# Patient Record
Sex: Male | Born: 1960
Health system: Southern US, Community
[De-identification: ages and names within clinical notes are randomized; demographics above are authoritative.]

## PROBLEM LIST (undated history)

## (undated) DIAGNOSIS — L309 Dermatitis, unspecified: Secondary | ICD-10-CM

## (undated) DIAGNOSIS — L409 Psoriasis, unspecified: Secondary | ICD-10-CM

## (undated) DIAGNOSIS — G4731 Primary central sleep apnea: Secondary | ICD-10-CM

## (undated) DIAGNOSIS — R112 Nausea with vomiting, unspecified: Secondary | ICD-10-CM

## (undated) DIAGNOSIS — T4145XA Adverse effect of unspecified anesthetic, initial encounter: Secondary | ICD-10-CM

## (undated) DIAGNOSIS — C189 Malignant neoplasm of colon, unspecified: Secondary | ICD-10-CM

## (undated) DIAGNOSIS — M25521 Pain in right elbow: Secondary | ICD-10-CM

## (undated) DIAGNOSIS — T8859XA Other complications of anesthesia, initial encounter: Secondary | ICD-10-CM

## (undated) DIAGNOSIS — T7840XA Allergy, unspecified, initial encounter: Secondary | ICD-10-CM

## (undated) DIAGNOSIS — K219 Gastro-esophageal reflux disease without esophagitis: Secondary | ICD-10-CM

## (undated) DIAGNOSIS — K648 Other hemorrhoids: Secondary | ICD-10-CM

## (undated) DIAGNOSIS — G4739 Other sleep apnea: Secondary | ICD-10-CM

## (undated) DIAGNOSIS — Z9889 Other specified postprocedural states: Secondary | ICD-10-CM

## (undated) DIAGNOSIS — E7800 Pure hypercholesterolemia, unspecified: Secondary | ICD-10-CM

## (undated) HISTORY — DX: Other hemorrhoids: K64.8

## (undated) HISTORY — PX: WRIST FRACTURE SURGERY: SHX121

## (undated) HISTORY — DX: Allergy, unspecified, initial encounter: T78.40XA

## (undated) HISTORY — DX: Pure hypercholesterolemia, unspecified: E78.00

## (undated) HISTORY — DX: Dermatitis, unspecified: L30.9

## (undated) HISTORY — DX: Pain in right elbow: M25.521

## (undated) HISTORY — DX: Primary central sleep apnea: G47.31

---
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
## 1999-08-19 ENCOUNTER — Other Ambulatory Visit: Admission: RE | Admit: 1999-08-19 | Discharge: 1999-08-19 | Payer: Self-pay | Admitting: Urology

## 1999-08-19 ENCOUNTER — Encounter (INDEPENDENT_AMBULATORY_CARE_PROVIDER_SITE_OTHER): Payer: Self-pay | Admitting: Specialist

## 2007-01-03 HISTORY — PX: OTHER SURGICAL HISTORY: SHX169

## 2007-09-06 ENCOUNTER — Encounter: Payer: Self-pay | Admitting: Gastroenterology

## 2007-09-20 ENCOUNTER — Encounter: Payer: Self-pay | Admitting: Gastroenterology

## 2007-09-23 ENCOUNTER — Telehealth: Payer: Self-pay | Admitting: Gastroenterology

## 2007-09-28 ENCOUNTER — Encounter: Payer: Self-pay | Admitting: Gastroenterology

## 2007-10-01 ENCOUNTER — Ambulatory Visit: Payer: Self-pay | Admitting: Gastroenterology

## 2007-10-01 DIAGNOSIS — K648 Other hemorrhoids: Secondary | ICD-10-CM

## 2007-10-01 DIAGNOSIS — Z85038 Personal history of other malignant neoplasm of large intestine: Secondary | ICD-10-CM | POA: Insufficient documentation

## 2007-10-01 DIAGNOSIS — D126 Benign neoplasm of colon, unspecified: Secondary | ICD-10-CM | POA: Insufficient documentation

## 2007-10-01 HISTORY — DX: Other hemorrhoids: K64.8

## 2007-10-04 ENCOUNTER — Telehealth: Payer: Self-pay | Admitting: Gastroenterology

## 2007-10-08 ENCOUNTER — Encounter: Payer: Self-pay | Admitting: Gastroenterology

## 2007-10-08 ENCOUNTER — Telehealth: Payer: Self-pay | Admitting: Gastroenterology

## 2007-10-09 ENCOUNTER — Telehealth: Payer: Self-pay | Admitting: Gastroenterology

## 2007-10-11 ENCOUNTER — Encounter: Payer: Self-pay | Admitting: Gastroenterology

## 2007-10-11 ENCOUNTER — Telehealth: Payer: Self-pay | Admitting: Gastroenterology

## 2007-10-15 ENCOUNTER — Ambulatory Visit: Payer: Self-pay | Admitting: Gastroenterology

## 2007-10-22 ENCOUNTER — Ambulatory Visit: Payer: Self-pay | Admitting: Gastroenterology

## 2007-10-22 ENCOUNTER — Encounter: Payer: Self-pay | Admitting: Gastroenterology

## 2007-10-22 ENCOUNTER — Telehealth: Payer: Self-pay | Admitting: Gastroenterology

## 2007-10-24 ENCOUNTER — Encounter: Payer: Self-pay | Admitting: Gastroenterology

## 2007-10-28 ENCOUNTER — Telehealth (INDEPENDENT_AMBULATORY_CARE_PROVIDER_SITE_OTHER): Payer: Self-pay | Admitting: *Deleted

## 2008-09-02 ENCOUNTER — Encounter: Payer: Self-pay | Admitting: Gastroenterology

## 2008-09-17 ENCOUNTER — Encounter (INDEPENDENT_AMBULATORY_CARE_PROVIDER_SITE_OTHER): Payer: Self-pay | Admitting: *Deleted

## 2009-06-04 ENCOUNTER — Telehealth: Payer: Self-pay | Admitting: Gastroenterology

## 2010-02-03 NOTE — Procedures (Signed)
Summary: Recall / [facility]  Recall / [place] [facility]   Imported By: Lennie Odor 06/03/2009 16:57:41  _____________________________________________________________________  External Attachment:    Type:   Image     Comment:   External Document

## 2010-02-03 NOTE — Progress Notes (Signed)
Summary: Schedule Colonoscopy  Phone Note Outgoing Call   Call placed by: Lamona Curl CMA Duncan Dull),  June 04, 2009 1:12 PM Call placed to: Patient Summary of Call: Patient is overdue for his colonoscopy. His colonoscopy completed in September 2009 showed two transverse colon polyps (path showed it to be three fragments of tubular adenoma. No high grade dysplasia or malignancy.) He also had rectal biopsies to come back showing some slight edema. He has a personal history of cancer as well. I have left a message for the patient to call back.  Initial call taken by: Lamona Curl CMA Duncan Dull),  June 04, 2009 1:15 PM  Follow-up for Phone Call        I have left a message for the patient to call back.Dottie Nelson-[name] CMA (AAMA)  June 10, 2009 1:30 PM      Appended Document: Schedule Colonoscopy no return call recieved. We will send a letter.

## 2011-02-24 ENCOUNTER — Telehealth: Payer: Self-pay

## 2011-02-24 DIAGNOSIS — G4720 Circadian rhythm sleep disorder, unspecified type: Secondary | ICD-10-CM

## 2011-02-24 NOTE — Telephone Encounter (Signed)
.  [facility] PT WOULD LIKE TO KNOW IF DR DOOLITTLE WOULD REFER HIM TO A PLACE FOR SLEEP APENA. PLEASE CALL [phone]

## 2011-02-25 NOTE — Telephone Encounter (Signed)
LMOM for patient to CB. 

## 2011-02-25 NOTE — Telephone Encounter (Signed)
Patient would like [place] to set up sleep study to rule out apnea.  Would like this appt on a Thursday or Friday night.  Chart to MD.

## 2011-02-25 NOTE — Telephone Encounter (Signed)
Does he want [place] to set up a sleep study to rule out apnea or an evaluation by a sleep neurologist to decide if he needs the sleep study

## 2011-05-31 ENCOUNTER — Telehealth: Payer: Self-pay

## 2011-05-31 NOTE — Telephone Encounter (Signed)
[id number] x1 

## 2011-06-01 NOTE — Telephone Encounter (Signed)
Called, spoke with pt.  He states this has already been taken care of.  Nothing further needed at this time.

## 2011-07-10 ENCOUNTER — Other Ambulatory Visit: Payer: Self-pay | Admitting: Internal Medicine

## 2011-07-18 ENCOUNTER — Encounter: Payer: Self-pay | Admitting: Gastroenterology

## 2011-08-27 ENCOUNTER — Telehealth: Payer: Self-pay

## 2011-08-27 NOTE — Telephone Encounter (Signed)
Please get details.  The patient hasn't been here since we went live on [facility] 01/31/2011. He probably needs and OV to discuss this.

## 2011-08-27 NOTE — Telephone Encounter (Signed)
Pt would like to get a referral for a pulmonologist.

## 2011-08-27 NOTE — Telephone Encounter (Signed)
Spoke with pt. His Mom needs a primary care and would like Dr. Merla Riches to refer them to someone he thinks he is good and would also help with her lung problems. The message was incorrect, she already has a pulmonologist

## 2011-08-28 NOTE — Telephone Encounter (Signed)
Drs Rodrigo Ran, Larina Earthly, John Russo(or any of their partners in fact) at [facility] [phone] can schedule their own appt

## 2011-08-28 NOTE — Telephone Encounter (Signed)
I have advised patient. This is not for his father, is for his mother, Alejandro [name] she was seen here about 3 weeks ago by Dr Merla Riches

## 2011-08-28 NOTE — Telephone Encounter (Signed)
LMOM to CB. 

## 2011-09-28 ENCOUNTER — Encounter: Payer: Self-pay | Admitting: *Deleted

## 2011-09-28 DIAGNOSIS — G4730 Sleep apnea, unspecified: Secondary | ICD-10-CM

## 2011-10-27 ENCOUNTER — Telehealth: Payer: Self-pay

## 2011-10-27 NOTE — Telephone Encounter (Signed)
The patient called to request refills on Simvastatin and Fluticasone cream .05%.  The patient stated the pharmacy advised him to contact [facility] as the prescriptions are now expired.  Please call patient at [phone].

## 2011-10-27 NOTE — Telephone Encounter (Signed)
Please pull chart.

## 2011-10-27 NOTE — Telephone Encounter (Signed)
Chart pulled to PA pool at nurses station [phone]

## 2011-10-28 NOTE — Telephone Encounter (Signed)
LMOM on cell to call back. Called home number and LM with a male to [facility]

## 2011-10-28 NOTE — Telephone Encounter (Signed)
What is patient's follow up plan? Has not had an office visit with [place] since July 2012. Was advised at that time to follow up in 6 months.

## 2011-10-29 NOTE — Telephone Encounter (Signed)
Pt.notified

## 2011-11-06 ENCOUNTER — Ambulatory Visit (INDEPENDENT_AMBULATORY_CARE_PROVIDER_SITE_OTHER): Payer: BC Managed Care – PPO | Admitting: Internal Medicine

## 2011-11-06 ENCOUNTER — Encounter: Payer: Self-pay | Admitting: Internal Medicine

## 2011-11-06 VITALS — BP 104/70 | HR 71 | Temp 98.3°F | Resp 16 | Ht 66.0 in | Wt 165.0 lb

## 2011-11-06 DIAGNOSIS — E785 Hyperlipidemia, unspecified: Secondary | ICD-10-CM

## 2011-11-06 DIAGNOSIS — G4730 Sleep apnea, unspecified: Secondary | ICD-10-CM

## 2011-11-06 DIAGNOSIS — R1115 Cyclical vomiting syndrome unrelated to migraine: Secondary | ICD-10-CM

## 2011-11-06 DIAGNOSIS — K219 Gastro-esophageal reflux disease without esophagitis: Secondary | ICD-10-CM

## 2011-11-06 DIAGNOSIS — R7989 Other specified abnormal findings of blood chemistry: Secondary | ICD-10-CM

## 2011-11-06 DIAGNOSIS — IMO0001 Reserved for inherently not codable concepts without codable children: Secondary | ICD-10-CM

## 2011-11-06 DIAGNOSIS — M549 Dorsalgia, unspecified: Secondary | ICD-10-CM

## 2011-11-06 DIAGNOSIS — R112 Nausea with vomiting, unspecified: Secondary | ICD-10-CM

## 2011-11-06 LAB — POCT CBC
Hemoglobin: 14.4 g/dL (ref 14.1–18.1)
Lymph, poc: 1.7 (ref 0.6–3.4)
MCH, POC: 27.9 pg (ref 27–31.2)
MCHC: 31.2 g/dL — AB (ref 31.8–35.4)
MID (cbc): 0.5 (ref 0–0.9)
MPV: 9.1 fL (ref 0–99.8)
POC Granulocyte: 4.2 (ref 2–6.9)
POC MID %: 8.4 %M (ref 0–12)
Platelet Count, POC: 296 10*3/uL (ref 142–424)
WBC: 6.5 10*3/uL (ref 4.6–10.2)

## 2011-11-06 MED ORDER — CALCIPOTRIENE 0.005 % EX SOLN: 1.0000 | Freq: Two times a day (BID) | CUTANEOUS | Status: DC

## 2011-11-06 NOTE — Progress Notes (Signed)
Subjective:    Patient ID: Alejandro [name], male    DOB: 20-May-1960, 51 y.o.   MRN: [id number]  HPI 1 mo hx nausea(long hx motion sickness--freq vomiting w/ least provocation) Builds up to vomiting which makes him better///has cont intermittently Lots of myalgias off and on in big muscles of legs like just worked out/neck tight No fever No wt loss No bm change discontin simvast 3 mos ago(chiro rec because of back pain) Mild gerd/burping For the past several weeks  Past medical history Patient Active Problem List  Diagnosis  . POLYP, COLON  . HEMORRHOIDS-INTERNAL  . PERSONAL HX COLON CANCER-Followed at [facility] and currently cancer free  . Sleep apnea-Recently diagnosed with central sleep apnea with marked hypoxia and is only partially responding to BiPAP    He also like to recheck his lipids now that he has been off Zocor for 2-3 months Review of Systems No weight loss or night sweats/general malaise thought secondary to sleep disorder    No headaches or vision changes No chest pain or palpitation/the shortness of breath or dyspnea on exertion Appetite has not changed appreciably There is no diarrhea or constipation and no specific abdominal pain Negative genitourinary symptoms The skin rashes or joint complaints except for the extensive myalgias Objective:   Physical Exam Vital signs normal HEENT clear No thyromegaly or lymphadenopathy Heart regular Lungs clear Abdomen soft nontender nondistended without masses or organomegaly Extremities with no edema/good peripheral pulses/He is generally tender to palpation in the anterior posterior thighs, the gastrocs, and  large muscles of the lumbar area.       Assessment & Plan:   1. Sleep apnea  Testosterone  2. GERD (gastroesophageal reflux disease)  Sedimentation Rate  3. Other and unspecified hyperlipidemia  T4, Free, TSH, Lipid Panel  4. Back pain  PSA, Sedimentation Rate  5. Myalgia and myositis  POCT CBC, Testosterone,  Comprehensive metabolic panel, PSA, CK, Sedimentation Rate  6. Other and unspecified hyperlipidemia Active T4, Free, TSH, Lipid Panel  7. Intractable nausea and vomiting  Amylase, Comprehensive metabolic panel, Sedimentation Rate   Results for orders placed in visit on 11/06/11  POCT CBC      Component Value Range   WBC 6.5  4.6 - 10.2 K/uL   Lymph, poc 1.7  0.6 - 3.4   POC LYMPH PERCENT 26.7  10 - 50 %L   MID (cbc) 0.5  0 - 0.9   POC MID % 8.4  0 - 12 %M   POC Granulocyte 4.2  2 - 6.9   Granulocyte percent 64.9  37 - 80 %G   RBC 5.16  4.69 - 6.13 M/uL   Hemoglobin 14.4  14.1 - 18.1 g/dL   HCT, POC 40.9  81.1 - 53.7 %   MCV 89.3  80 - 97 fL   MCH, POC 27.9  27 - 31.2 pg   MCHC 31.2 (*) 31.8 - 35.4 g/dL   RDW, POC 91.4     Platelet Count, POC 296  142 - 424 K/uL   MPV 9.1  0 - 99.8 fL  T4, FREE      Component Value Range   Free T4 1.10  0.80 - 1.80 ng/dL  TSH      Component Value Range   TSH 2.363  0.350 - 4.500 uIU/mL  TESTOSTERONE      Component Value Range   Testosterone      LIPID PANEL      Component Value Range   Cholesterol 230 (*)  0 - 200 mg/dL   Triglycerides 161 (*) <150 mg/dL   HDL 44  >09 mg/dL   Total CHOL/HDL Ratio 5.2     VLDL 55 (*) 0 - 40 mg/dL   LDL Cholesterol 604 (*) 0 - 99 mg/dL  AMYLASE      Component Value Range   Amylase 60  0 - 105 U/L  COMPREHENSIVE METABOLIC PANEL      Component Value Range   Sodium 139  135 - 145 mEq/L   Potassium 3.8  3.5 - 5.3 mEq/L   Chloride 104  96 - 112 mEq/L   CO2 27  19 - 32 mEq/L   Glucose, Bld 100 (*) 70 - 99 mg/dL   BUN 16  6 - 23 mg/dL   Creat 5.40  9.81 - 1.91 mg/dL   Total Bilirubin 0.3  0.3 - 1.2 mg/dL   Alkaline Phosphatase 119 (*) 39 - 117 U/L   AST 18  0 - 37 U/L   ALT 18  0 - 53 U/L   Total Protein 6.7  6.0 - 8.3 g/dL   Albumin 4.2  3.5 - 5.2 g/dL   Calcium 8.9  8.4 - 47.8 mg/dL  PSA      Component Value Range   PSA 0.66  <=4.00 ng/mL  CK      Component Value Range   Total CK 131  7 - 232  U/L  SEDIMENTATION RATE      Component Value Range   Sed Rate 1  0 - 16 mm/hr   Discuss next step after labs

## 2011-11-07 LAB — COMPREHENSIVE METABOLIC PANEL
ALT: 18 U/L (ref 0–53)
AST: 18 U/L (ref 0–37)
Albumin: 4.2 g/dL (ref 3.5–5.2)
Alkaline Phosphatase: 119 U/L — ABNORMAL HIGH (ref 39–117)
Glucose, Bld: 100 mg/dL — ABNORMAL HIGH (ref 70–99)
Potassium: 3.8 mEq/L (ref 3.5–5.3)
Sodium: 139 mEq/L (ref 135–145)
Total Bilirubin: 0.3 mg/dL (ref 0.3–1.2)
Total Protein: 6.7 g/dL (ref 6.0–8.3)

## 2011-11-07 LAB — SEDIMENTATION RATE: Sed Rate: 1 mm/hr (ref 0–16)

## 2011-11-07 LAB — CK: Total CK: 131 U/L (ref 7–232)

## 2011-11-07 LAB — AMYLASE: Amylase: 60 U/L (ref 0–105)

## 2011-11-07 LAB — LIPID PANEL
LDL Cholesterol: 131 mg/dL — ABNORMAL HIGH (ref 0–99)
Total CHOL/HDL Ratio: 5.2 Ratio
VLDL: 55 mg/dL — ABNORMAL HIGH (ref 0–40)

## 2011-11-07 LAB — T4, FREE: Free T4: 1.1 ng/dL (ref 0.80–1.80)

## 2011-11-10 NOTE — Addendum Note (Signed)
Addended by: Johnnette Litter on: 11/10/2011 04:45 PM   Modules accepted: Orders

## 2012-04-04 ENCOUNTER — Encounter: Payer: Self-pay | Admitting: Internal Medicine

## 2012-05-29 ENCOUNTER — Encounter: Payer: Self-pay | Admitting: Neurology

## 2012-05-29 ENCOUNTER — Ambulatory Visit (INDEPENDENT_AMBULATORY_CARE_PROVIDER_SITE_OTHER): Payer: BC Managed Care – PPO | Admitting: Neurology

## 2012-05-29 VITALS — BP 117/81 | HR 62 | Temp 97.7°F | Ht 62.0 in | Wt 162.0 lb

## 2012-05-29 DIAGNOSIS — G4731 Primary central sleep apnea: Secondary | ICD-10-CM

## 2012-05-29 DIAGNOSIS — G4730 Sleep apnea, unspecified: Secondary | ICD-10-CM | POA: Insufficient documentation

## 2012-05-29 MED ORDER — ZOLPIDEM TARTRATE 10 MG PO TABS: 10.0000 mg | ORAL_TABLET | Freq: Every evening | ORAL | Status: DC | PRN

## 2012-05-29 NOTE — Patient Instructions (Signed)
Exercise to Lose Weight Exercise and a healthy diet may help you lose weight. Your doctor may suggest specific exercises. EXERCISE IDEAS AND TIPS  Choose low-cost things you enjoy doing, such as walking, bicycling, or exercising to workout videos.  Take stairs instead of the elevator.  Walk during your lunch break.  Park your car further away from work or school.  Go to a gym or an exercise class.  Start with 5 to 10 minutes of exercise each day. Build up to 30 minutes of exercise 4 to 6 days a week.  Wear shoes with good support and comfortable clothes.  Stretch before and after working out.  Work out until you breathe harder and your heart beats faster.  Drink extra water when you exercise.  Do not do so much that you hurt yourself, feel dizzy, or get very short of breath. Exercises that burn about 150 calories:  Running 1  miles in 15 minutes.  Playing volleyball for 45 to 60 minutes.  Washing and waxing a car for 45 to 60 minutes.  Playing touch football for 45 minutes.  Walking 1  miles in 35 minutes.  Pushing a stroller 1  miles in 30 minutes.  Playing basketball for 30 minutes.  Raking leaves for 30 minutes.  Bicycling 5 miles in 30 minutes.  Walking 2 miles in 30 minutes.  Dancing for 30 minutes.  Shoveling snow for 15 minutes.  Swimming laps for 20 minutes.  Walking up stairs for 15 minutes.  Bicycling 4 miles in 15 minutes.  Gardening for 30 to 45 minutes.  Jumping rope for 15 minutes.  Washing windows or floors for 45 to 60 minutes. Document Released: 01/21/2010 Document Revised: 03/13/2011 Document Reviewed: 01/21/2010 ExitCare Patient Information 2014 [employer]. Sleep Apnea Sleep apnea is disorder that affects a person's sleep. A person with sleep apnea has abnormal pauses in their breathing when they sleep. It is hard for them to get a good sleep. This makes a person tired during the day. It also can lead to other physical  problems. There are three types of sleep apnea. One type is when breathing stops for a short time because your airway is blocked (obstructive sleep apnea). Another type is when the brain sometimes fails to give the normal signal to breathe to the muscles that control your breathing (central sleep apnea). The third type is a combination of the other two types. HOME CARE  Do not sleep on your back. Try to sleep on your side.  Take all medicine as told by your doctor.  Avoid alcohol, calming medicines (sedatives), and depressant drugs.  Try to lose weight if you are overweight. Talk to your doctor about a healthy weight goal. Your doctor may have you use a device that helps to open your airway. It can help you get the air that you need. It is called a positive airway pressure (PAP) device. There are three types of PAP devices:  Continuous positive airway pressure (CPAP) device.  Nasal expiratory positive airway pressure (EPAP) device.  Bilevel positive airway pressure (BPAP) device. MAKE SURE YOU:  Understand these instructions.  Will watch your condition.  Will get help right away if you are not doing well or get worse. Document Released: 09/28/2007 Document Revised: 12/06/2011 Document Reviewed: 04/22/2011 Exit[employer] Patient Information 2014 [employer].  

## 2012-05-29 NOTE — Assessment & Plan Note (Signed)
Patient is a 52 year old Caucasian male that presented with the complaint of excessive daytime sleepiness, frequent awakenings nonrestorative sleep shortness of breath insomnia and witnessed snoring. AHI was 44.2 CPAP was not tolerated and costs central apneas. Patient has been placed on a V. cut SV which finally reduced the apnea index was 0.7

## 2012-05-29 NOTE — Progress Notes (Signed)
[facility]  Provider:  Dr Vickey Huger Referring Provider: No ref. provider found Primary Care Physician:  Dr. Merla Riches. [facility] [facility]   Chief Complaint  Patient presents with  . Follow-up    rm 10,CPAP    HPI:  Alejandro [name] is a 52 y.o. male here as a referral from Dr.Dollittle. The patient was diagnosed with complex apnea in 2013, a baseline AHI of 44 in his split study on 05-11-11 , followed by a BIPAP adapt SV titration .   Mr. Alejandro [name] is a right-handed Caucasian gentleman 52 years of age who presented and 2013 with a past medical history of comfort and with sleep apnea. He had sleep complains of excessive daytime sleepiness, frequent awakenings with shortness of breath, witnessed apneas and excessive snoring as well as insomnia. He felt that his sleep was not restorative a sleep study on 04/21/2011- the AHI of 44.2 was severe ,  CPAP was applied in the split study protocol but caused  central apneas to emerge . The patient returned for a full BiPAP-adapt SV titration and an EPAP of 5 cm water the AHI was reduced to 0.0.   A compliance download from October 2013 showed a reduction of the apnea index was 0.7 hypopnea index to 2.3 AHI 3.0 , he used the machine at the time 4 hours 27 minutes nightly and 30 over 30 days.  The patient reported that he never quite felt that the positive airway pressure made any difference to him in daytime, he felt not less sleepy or not better restored. He discontinued the PAP and today endorses the Epworth score at 6 points,  Off PAP for 2 month . [facility] D[facility]  He  is motivated to restart his PAP therapy , but discouraged that he seems to lose the mask every night, waking him up. His wife sleeps in ano there room. He took the PAP to skiing in [place] and got many error messages, he will have it checked in the sleep lab today,  A download here failed.   His  bedtimes are between 11 PM  and midnight  And he rises about 7  AM. He normally sleeps through the night , he has no nocturia. His wife sleeps in a seperate room.      Patient doesn't have many risk factors that would have explained this high apnea index at baseline. His BMI is a normal range he doesn't have nasal deviation but has a history of sinus disease and allergic rhinitis causing him to mouth breath.  He does not have retrognathia no history of oral surgeries. There is a family history of possible apnea in his father and one grandfather.  Review of Systems: Out of a complete 14 system review, the patient complains of only the following symptoms, and all other reviewed systems are negative.    History   Social History  . Marital Status: Married    Spouse Name: N/A    Number of Children: N/A  . Years of Education: N/A   Occupational History  . Not on file.   Social History Main Topics  . Smoking status: Never Smoker   . Smokeless tobacco: Not on file  . Alcohol Use: Yes  . Drug Use: No  . Sexually Active: Not on file   Other Topics Concern  . Not on file   Social History Narrative  . No narrative on file    Family History  Problem Relation Age of Onset  .  Sleep disorder Father   . Sleep disorder Paternal Grandfather     Past Medical History  Diagnosis Date  . Sleep apnea syndrome     complex apnea , baseline AHI of 44 on 04-21-11, changed to adapt SV      History reviewed. No pertinent past surgical history.  Current Outpatient Prescriptions  Medication Sig Dispense Refill  . Calcipotriene 0.005 % solution Apply 1 applicator topically 2 (two) times daily.  60 mL  2  . simvastatin (ZOCOR) 40 MG tablet Take 40 mg by mouth every evening.       No current facility-administered medications for this visit.    Allergies as of 05/29/2012 - Review Complete 05/29/2012  Allergen Reaction Noted  . Depo-medrol (methylprednisolone acetate) Other (See Comments) 06/03/2007  . Codeine  10/15/2007    Vitals: There were no  vitals taken for this visit. Last Weight:  Wt Readings from Last 1 Encounters:  11/06/11 165 lb (74.844 kg)   Last Height:   Ht Readings from Last 1 Encounters:  11/06/11 5\' 6"  (1.676 m)   Vision Screening:   Physical exam:  General: The patient is awake, alert and appears not in acute distress. The patient is well groomed. Head: Normocephalic, atraumatic. Neck is supple. Mallampati 3 , very  Small upper airway, no nasal restriction, no retrognathia.  neck circumference: 15.6  Cardiovascular:  Regular rate and rhythm, without  murmurs or carotid bruit, and without distended neck veins. Respiratory: Lungs are clear to auscultation. Skin:  Without evidence of edema, or rash Trunk: BMI is normal posture.  Neurologic exam : The patient is awake and alert, oriented to place and time.  Memory subjective  described as intact. There is a normal attention span & concentration ability. Speech is fluent without  dysarthria, dysphonia or aphasia. Mood and affect are appropriate.  Cranial nerves: Pupils are equal and briskly reactive to light. Extraocular movements  in vertical and horizontal planes intact and without nystagmus. Visual fields by finger perimetry are intact. Hearing to finger rub intact.  Facial sensation intact to fine touch. Facial motor strength is symmetric and tongue and uvula move midline.  Motor exam:   Normal tone and normal muscle bulk and symmetric normal strength in all extremities.  Sensory:  Fine touch, pinprick and vibration were tested in all extremities. Proprioception is tested in the upper extremities only. This was  normal.  Coordination: Rapid alternating movements in the fingers/hands is tested and normal. Finger-to-nose maneuver tested and normal without evidence of ataxia, dysmetria or tremor.  Gait and station: Patient walks without assistive device and is able and assisted stool climb up to the exam table. Strength within normal limits. Stance is stable and  normal. Deep tendon reflexes: in the  upper and lower extremities are symmetric and intact. .   Assessment:  After physical and neurologic examination, review of laboratory studies, imaging, neurophysiology testing and pre-existing records, assessment will be reviewed on the problem list.  Plan:  Treatment plan and additional workup will be reviewed under Problem List.    Based on the patient's report of a problem as almost nightly loss of the mask, or dislodgment, recommend to try the inflatable ultrastructure of a Pillairo mask The Epworth score was of no concern. Previous data download documented good effectiveness of the machine. The patient would like to use this adapt SV with a better fitting mask. Rv with download in 3 month

## 2012-07-10 ENCOUNTER — Telehealth: Payer: Self-pay

## 2012-07-10 MED ORDER — SCOPOLAMINE 1 MG/3DAYS TD PT72: 1.0000 | MEDICATED_PATCH | TRANSDERMAL | Status: DC

## 2012-07-10 NOTE — Telephone Encounter (Signed)
Pt states that he will be going on a cruise in week and would like to know if something can be called in for him for motion sickness. [phone]

## 2012-07-10 NOTE — Telephone Encounter (Signed)
Scopolamine patches sent to pharmacy.

## 2012-07-10 NOTE — Telephone Encounter (Signed)
Patient advised.

## 2012-07-11 ENCOUNTER — Telehealth: Payer: Self-pay

## 2012-07-11 MED ORDER — CALCIPOTRIENE 0.005 % EX SOLN: 1.0000 | Freq: Two times a day (BID) | CUTANEOUS | Status: DC

## 2012-07-11 NOTE — Telephone Encounter (Signed)
Pt would like to have a refill on Calcipotriene 0.005 % solution. [facility] [address]  [phone]

## 2012-07-11 NOTE — Telephone Encounter (Signed)
Rx sent to pharmacy   

## 2012-07-18 ENCOUNTER — Telehealth: Payer: Self-pay

## 2012-07-18 MED ORDER — FLUTICASONE PROPIONATE 0.05 % EX CREA: TOPICAL_CREAM | Freq: Two times a day (BID) | CUTANEOUS | Status: DC

## 2012-07-18 NOTE — Telephone Encounter (Signed)
Pt called and reported that we had RFd the wrong Rx. He needs RF of his fluticasone Propionate cream 0.05% instead of the scalp sol that the pharmacy had requested. Advised pt that he is due for OV but that I can give him one RF. Pt agreed to set up appt.

## 2012-07-29 ENCOUNTER — Ambulatory Visit (INDEPENDENT_AMBULATORY_CARE_PROVIDER_SITE_OTHER): Payer: BC Managed Care – PPO | Admitting: Emergency Medicine

## 2012-07-29 VITALS — BP 118/78 | HR 65 | Temp 98.1°F | Resp 16 | Ht 66.0 in | Wt 162.0 lb

## 2012-07-29 DIAGNOSIS — A088 Other specified intestinal infections: Secondary | ICD-10-CM

## 2012-07-29 LAB — POCT CBC
HCT, POC: 49 % (ref 43.5–53.7)
MCH, POC: 29 pg (ref 27–31.2)
MCV: 89 fL (ref 80–97)
MID (cbc): 0.4 (ref 0–0.9)
POC LYMPH PERCENT: 14.4 %L (ref 10–50)
Platelet Count, POC: 252 10*3/uL (ref 142–424)
RDW, POC: 14 %
WBC: 7.6 10*3/uL (ref 4.6–10.2)

## 2012-07-29 LAB — COMPREHENSIVE METABOLIC PANEL
AST: 15 U/L (ref 0–37)
Albumin: 4.7 g/dL (ref 3.5–5.2)
Alkaline Phosphatase: 119 U/L — ABNORMAL HIGH (ref 39–117)
BUN: 15 mg/dL (ref 6–23)
Calcium: 9.6 mg/dL (ref 8.4–10.5)
Chloride: 101 mEq/L (ref 96–112)
Creat: 0.91 mg/dL (ref 0.50–1.35)
Glucose, Bld: 100 mg/dL — ABNORMAL HIGH (ref 70–99)
Potassium: 4.1 mEq/L (ref 3.5–5.3)

## 2012-07-29 MED ORDER — ONDANSETRON 8 MG PO TBDP: 8.0000 mg | ORAL_TABLET | Freq: Three times a day (TID) | ORAL | Status: DC | PRN

## 2012-07-29 NOTE — Patient Instructions (Signed)
Clear Liquid Diet  The clear liquid dietconsists of foods that are liquid or will become liquid at room temperature.You should be able to see through the liquid and beverages. Examples of foods allowed on a clear liquid diet include fruit juice, broth or bouillon, gelatin, or frozen ice pops.  The purpose of this diet is to provide necessary fluid, electrolytes such as sodium and potassium, and energy to keep the body functioning during times when you are not able to consume a regular diet.A clear liquid diet should not be continued for long periods of time as it is not nutritionally adequate.   REASONS FOR USING A CLEAR LIQUID DIET   In sudden onset (acute) conditions for a patient before or after surgery.   As the first step in oral feeding.   For fluid and electrolyte replacement in diarrheal diseases.   As a diet before certain medical tests are performed.  ADEQUACY  The clear liquid diet is adequate only in ascorbic acid, according to the Recommended Dietary Allowances of the National Research Council.  CHOOSING FOODS  Breads and Starches   Allowed:  None are allowed.   Avoid: All are avoided.  Vegetables   Allowed:  Strained tomato or vegetable juice.   Avoid: Any others.  Fruit   Allowed:  Strained fruit juices and fruit drinks. Include 1 serving of citrus or vitamin C-enriched fruit juice daily.   Avoid: Any others.  Meat and Meat Substitutes   Allowed:  None are allowed.   Avoid: All are avoided.  Milk   Allowed:  None are allowed.   Avoid: All are avoided.  Soups and Combination Foods   Allowed:  Clear bouillon, broth, or strained broth-based soups.   Avoid: Any others.  Desserts and Sweets   Allowed:  Sugar, honey. High protein gelatin. Flavored gelatin, ices, or frozen ice pops that do not contain milk.   Avoid: Any others.  Fats and Oils   Allowed:  None are allowed.   Avoid: All are avoided.  Beverages   Allowed: Cereal beverages, coffee (regular or decaffeinated), tea, or soda  at the discretion of your caregiver.   Avoid: Any others.  Condiments   Allowed:  Iodized salt.   Avoid: Any others, including pepper.  Supplements   Allowed:  Liquid nutrition beverages.   Avoid: Any others that contain lactose or fiber.  SAMPLE MEAL PLAN  Breakfast   4 oz (120 mL) strained orange juice.    to 1 cup (125 to 250 mL) gelatin (plain or fortified).   1 cup (250 mL) beverage (coffee or tea).   Sugar, if desired.  Midmorning Snack    cup (125 mL) gelatin (plain or fortified).  Lunch   1 cup (250 mL) broth or consomm.   4 oz (120 mL) strained grapefruit juice.    cup (125 mL) gelatin (plain or fortified).   1 cup (250 mL) beverage (coffee or tea).   Sugar, if desired.  Midafternoon Snack    cup (125 mL) fruit ice.    cup (125 mL) strained fruit juice.  Dinner   1 cup (250 mL) broth or consomm.    cup (125 mL) cranberry juice.    cup (125 mL) flavored gelatin (plain or fortified).   1 cup (250 mL) beverage (coffee or tea).   Sugar, if desired.  Evening Snack   4 oz (120 mL) strained apple juice (vitamin C-fortified).    cup (125 mL) flavored gelatin (plain or fortified).  Document 

## 2012-07-29 NOTE — Progress Notes (Addendum)
[facility] [address] [place] [address] [phone]  Date:  07/29/2012   Name:  Alejandro [name]   DOB:  1960/04/25   MRN:  [id number]  PCP:  No primary provider on file.    Chief Complaint: Nausea, Emesis, Chills, Fatigue and recent travel   History of Present Illness:  Alejandro [name] is a 52 y.o. very pleasant male patient who presents with the following:  Ill for past three days after returning from cruise to the [place].  Has bloating and belching and malaise and fatigue.  Nausea and vomiting.  Stools are loose but not diarrhea.  Foul smelling.  No abdominal pain.  Eating anything.  Fine when lays down but gets ill feeling when upright.  No ill contacts. No one else from cruise is sick to the best of his knowledge.  No improvement with over the counter medications or other home remedies. Denies other complaint or health concern today.   Patient Active Problem List   Diagnosis Date Noted  . Sleep apnea syndrome   . Sleep apnea 09/28/2011  . POLYP, COLON 10/01/2007  . HEMORRHOIDS-INTERNAL 10/01/2007  . PERSONAL HX COLON CANCER 10/01/2007    Past Medical History  Diagnosis Date  . Sleep apnea syndrome     complex apnea , baseline AHI of 44 on 04-21-11, changed to adapt SV    . High cholesterol   . Central sleep apnea     AHI of 44 exacerbated to 52 and failed CPAP  . Allergy   . Cancer     Past Surgical History  Procedure Laterality Date  . Colonoscopy  2009    CANCEROUS POLYPS REMOVED    History  Substance Use Topics  . Smoking status: Never Smoker   . Smokeless tobacco: Not on file  . Alcohol Use: 0.0 oz/week    0.5 drink(s) per week    Family History  Problem Relation Age of Onset  . Sleep disorder Father   . Sleep disorder Paternal Grandfather   . Cancer Paternal Grandmother     breast    Allergies  Allergen Reactions  . Cortisone   . Depo-Medrol (Methylprednisolone Acetate) Other (See Comments)    Blurry vision- blisters  behind retinas  . Codeine     REACTION: nausea, vomiting    Medication list has been reviewed and updated.  Current Outpatient Prescriptions on File Prior to Visit  Medication Sig Dispense Refill  . Calcipotriene 0.005 % solution Apply 1 applicator topically 2 (two) times daily.  60 mL  2  . fluticasone (CUTIVATE) 0.05 % cream Apply topically 2 (two) times daily. PATIENT NEEDS OFFICE VISIT FOR ADDITIONAL REFILLS  30 g  0  . scopolamine (TRANSDERM-SCOP) 1.5 MG Place 1 patch (1.5 mg total) onto the skin every 3 (three) days.  10 patch  0  . simvastatin (ZOCOR) 40 MG tablet Take 40 mg by mouth every evening.      . zolpidem (AMBIEN) 10 MG tablet Take 1 tablet (10 mg total) by mouth at bedtime as needed for sleep.  30 tablet  1   No current facility-administered medications on file prior to visit.    Review of Systems:  As per HPI, otherwise negative.    Physical Examination: Filed Vitals:   07/29/12 0826  BP: 118/78  Pulse: 65  Temp: 98.1 F (36.7 C)  Resp: 16   Filed Vitals:   07/29/12 0826  Height: 5\' 6"  (1.676 m)  Weight: 162 lb (73.483  kg)   Body mass index is 26.16 kg/(m^2). Ideal Body Weight: Weight in (lb) to have BMI = 25: 154.6  GEN: WDWN, NAD, Non-toxic, A & O x 3 HEENT: Atraumatic, Normocephalic. Neck supple. No masses, No LAD. Ears and Nose: No external deformity. CV: RRR, No M/G/R. No JVD. No thrill. No extra heart sounds. PULM: CTA B, no wheezes, crackles, rhonchi. No retractions. No resp. distress. No accessory muscle use. ABD: S, NT, ND, +BS. No rebound. No HSM. EXTR: No c/c/e NEURO Normal gait.  PSYCH: Normally interactive. Conversant. Not depressed or anxious appearing.  Calm demeanor.    Assessment and Plan: Gastroenteritis zofran Labs   Signed,  Phillips Odor, MD   Results for orders placed in visit on 07/29/12  POCT CBC      Result Value Range   WBC 7.6  4.6 - 10.2 K/uL   Lymph, poc 1.1  0.6 - 3.4   POC LYMPH PERCENT 14.4  10 - 50  %L   MID (cbc) 0.4  0 - 0.9   POC MID % 5.3  0 - 12 %M   POC Granulocyte 6.1  2 - 6.9   Granulocyte percent 80.3 (*) 37 - 80 %G   RBC 5.51  4.69 - 6.13 M/uL   Hemoglobin 16.0  14.1 - 18.1 g/dL   HCT, POC 16.1  09.6 - 53.7 %   MCV 89.0  80 - 97 fL   MCH, POC 29.0  27 - 31.2 pg   MCHC 32.7  31.8 - 35.4 g/dL   RDW, POC 04.5     Platelet Count, POC 252  142 - 424 K/uL   MPV 9.7  0 - 99.8 fL

## 2012-08-02 LAB — OVA AND PARASITE SCREEN
OP: NONE SEEN
OP: NONE SEEN

## 2012-08-04 LAB — STOOL CULTURE

## 2012-08-07 ENCOUNTER — Encounter (HOSPITAL_COMMUNITY): Payer: Self-pay | Admitting: Pharmacy Technician

## 2012-08-14 NOTE — Pre-Procedure Instructions (Signed)
Rik Anstine  08/14/2012   Your procedure is scheduled on:  08/22/12  Report to [facility] at 8 AM.  Call this number if you have problems the morning of surgery: [phone]   Remember:   Do not eat food or drink liquids after midnight.   Take these medicines the morning of surgery with A SIP OF WATER: prilosec   Do not wear jewelry, make-up or nail polish.  Do not wear lotions, powders, or perfumes. You may wear deodorant.  Do not shave 48 hours prior to surgery. Men may shave face and neck.  Do not bring valuables to the hospital.  [facility] is not responsible                   for any belongings or valuables.  Contacts, dentures or bridgework may not be worn into surgery.  Leave suitcase in the car. After surgery it may be brought to your room.  For patients admitted to the hospital, checkout time is 11:00 AM the day of  discharge.   Patients discharged the day of surgery will not be allowed to drive  home.  Name and phone number of your driver: family  Special Instructions: Shower using CHG 2 nights before surgery and the night before surgery.  If you shower the day of surgery use CHG.  Use special wash - you have one bottle of CHG for all showers.  You should use approximately 1/3 of the bottle for each shower.   Please read over the following fact sheets that you were given: Pain Booklet, Coughing and Deep Breathing and Surgical Site Infection Prevention

## 2012-08-15 ENCOUNTER — Encounter (HOSPITAL_COMMUNITY)
Admission: RE | Admit: 2012-08-15 | Discharge: 2012-08-15 | Disposition: A | Discharge status: Home / Self Care | Payer: BC Managed Care – PPO | Source: Ambulatory Visit | Attending: Orthopedic Surgery | Admitting: Orthopedic Surgery

## 2012-08-15 ENCOUNTER — Encounter (HOSPITAL_COMMUNITY): Payer: Self-pay

## 2012-08-15 DIAGNOSIS — Z01812 Encounter for preprocedural laboratory examination: Secondary | ICD-10-CM | POA: Insufficient documentation

## 2012-08-15 DIAGNOSIS — Z01818 Encounter for other preprocedural examination: Secondary | ICD-10-CM | POA: Insufficient documentation

## 2012-08-15 HISTORY — DX: Adverse effect of unspecified anesthetic, initial encounter: T41.45XA

## 2012-08-15 HISTORY — DX: Other complications of anesthesia, initial encounter: T88.59XA

## 2012-08-15 HISTORY — DX: Psoriasis, unspecified: L40.9

## 2012-08-15 HISTORY — DX: Gastro-esophageal reflux disease without esophagitis: K21.9

## 2012-08-15 LAB — CBC
HCT: 45.1 % (ref 39.0–52.0)
MCHC: 33.7 g/dL (ref 30.0–36.0)
RDW: 13.1 % (ref 11.5–15.5)

## 2012-08-21 MED ORDER — DEXTROSE 5 % IV SOLN
3.0000 g | INTRAVENOUS | Status: AC
  Administered 2012-08-22: 3 g via INTRAVENOUS
  Filled 2012-08-21: qty 3000

## 2012-08-22 ENCOUNTER — Encounter (HOSPITAL_COMMUNITY)
Admission: RE | Disposition: A | Discharge status: Home / Self Care | Payer: Self-pay | Source: Ambulatory Visit | Attending: Orthopedic Surgery

## 2012-08-22 ENCOUNTER — Encounter (HOSPITAL_COMMUNITY): Payer: Self-pay | Admitting: Certified Registered Nurse Anesthetist

## 2012-08-22 ENCOUNTER — Observation Stay (HOSPITAL_COMMUNITY)
Admission: RE | Admit: 2012-08-22 | Discharge: 2012-08-23 | Disposition: A | Discharge status: Home / Self Care | Payer: BC Managed Care – PPO | Source: Ambulatory Visit | Attending: Orthopedic Surgery | Admitting: Orthopedic Surgery

## 2012-08-22 ENCOUNTER — Encounter (HOSPITAL_COMMUNITY): Payer: Self-pay

## 2012-08-22 ENCOUNTER — Ambulatory Visit (HOSPITAL_COMMUNITY): Payer: BC Managed Care – PPO | Admitting: Certified Registered Nurse Anesthetist

## 2012-08-22 DIAGNOSIS — G4730 Sleep apnea, unspecified: Secondary | ICD-10-CM | POA: Insufficient documentation

## 2012-08-22 DIAGNOSIS — M23305 Other meniscus derangements, unspecified medial meniscus, unspecified knee: Principal | ICD-10-CM | POA: Insufficient documentation

## 2012-08-22 DIAGNOSIS — E7800 Pure hypercholesterolemia, unspecified: Secondary | ICD-10-CM | POA: Insufficient documentation

## 2012-08-22 HISTORY — DX: Other sleep apnea: G47.39

## 2012-08-22 HISTORY — DX: Other specified postprocedural states: R11.2

## 2012-08-22 HISTORY — DX: Other specified postprocedural states: Z98.890

## 2012-08-22 HISTORY — PX: KNEE ARTHROSCOPY: SHX127

## 2012-08-22 HISTORY — DX: Malignant neoplasm of colon, unspecified: C18.9

## 2012-08-22 HISTORY — DX: Primary central sleep apnea: G47.31

## 2012-08-22 SURGERY — ARTHROSCOPY, KNEE
Anesthesia: General | Site: Knee | Laterality: Right | Wound class: Clean

## 2012-08-22 MED ORDER — PROPOFOL 10 MG/ML IV BOLUS
INTRAVENOUS | Status: DC | PRN
  Administered 2012-08-22: 200 mg via INTRAVENOUS

## 2012-08-22 MED ORDER — LIDOCAINE HCL (PF) 1 % IJ SOLN
INTRAMUSCULAR | Status: DC | PRN
  Administered 2012-08-22: 30 mL

## 2012-08-22 MED ORDER — ONDANSETRON HCL 4 MG PO TABS: 4.0000 mg | ORAL_TABLET | ORAL | Status: DC | PRN

## 2012-08-22 MED ORDER — LACTATED RINGERS IV SOLN
INTRAVENOUS | Status: DC
  Administered 2012-08-22: 1000 mL via INTRAVENOUS

## 2012-08-22 MED ORDER — ONDANSETRON HCL 4 MG/2ML IJ SOLN: 4.0000 mg | INTRAMUSCULAR | Status: DC | PRN

## 2012-08-22 MED ORDER — SODIUM CHLORIDE 0.9 % IR SOLN
Status: DC | PRN
  Administered 2012-08-22: 6000 mL

## 2012-08-22 MED ORDER — OXYCODONE-ACETAMINOPHEN 5-325 MG PO TABS: 1.0000 | ORAL_TABLET | ORAL | Status: DC | PRN

## 2012-08-22 MED ORDER — PROMETHAZINE HCL 25 MG/ML IJ SOLN
12.5000 mg | Freq: Four times a day (QID) | INTRAMUSCULAR | Status: DC | PRN
  Administered 2012-08-22: 12.5 mg via INTRAVENOUS
  Filled 2012-08-22: qty 1

## 2012-08-22 MED ORDER — CHLORHEXIDINE GLUCONATE 4 % EX LIQD: 60.0000 mL | Freq: Once | CUTANEOUS | Status: DC

## 2012-08-22 MED ORDER — GLYCOPYRROLATE 0.2 MG/ML IJ SOLN
INTRAMUSCULAR | Status: DC | PRN
  Administered 2012-08-22: 0.2 mg via INTRAVENOUS

## 2012-08-22 MED ORDER — HYDROMORPHONE HCL PF 1 MG/ML IJ SOLN
INTRAMUSCULAR | Status: AC
  Administered 2012-08-22: 0.5 mg via INTRAVENOUS
  Filled 2012-08-22: qty 1

## 2012-08-22 MED ORDER — HYDROMORPHONE HCL PF 1 MG/ML IJ SOLN
0.2500 mg | INTRAMUSCULAR | Status: DC | PRN
  Administered 2012-08-22: 0.5 mg via INTRAVENOUS

## 2012-08-22 MED ORDER — PHENOL 1.4 % MT LIQD: 1.0000 | OROMUCOSAL | Status: DC | PRN

## 2012-08-22 MED ORDER — LIDOCAINE HCL (CARDIAC) 20 MG/ML IV SOLN
INTRAVENOUS | Status: DC | PRN
  Administered 2012-08-22: 100 mg via INTRAVENOUS

## 2012-08-22 MED ORDER — ONDANSETRON HCL 4 MG/2ML IJ SOLN
INTRAMUSCULAR | Status: DC | PRN
  Administered 2012-08-22 (×2): 4 mg via INTRAVENOUS

## 2012-08-22 MED ORDER — SCOPOLAMINE 1 MG/3DAYS TD PT72
1.0000 | MEDICATED_PATCH | TRANSDERMAL | Status: DC
  Administered 2012-08-22: 1 via TRANSDERMAL
  Administered 2012-08-22: 1.5 mg via TRANSDERMAL
  Filled 2012-08-22 (×2): qty 1

## 2012-08-22 MED ORDER — MORPHINE SULFATE 4 MG/ML IJ SOLN
INTRAMUSCULAR | Status: AC
  Filled 2012-08-22: qty 1

## 2012-08-22 MED ORDER — PANTOPRAZOLE SODIUM 40 MG PO TBEC: 40.0000 mg | DELAYED_RELEASE_TABLET | Freq: Every day | ORAL | Status: DC

## 2012-08-22 MED ORDER — ONDANSETRON HCL 4 MG/2ML IJ SOLN: 4.0000 mg | Freq: Once | INTRAMUSCULAR | Status: DC | PRN

## 2012-08-22 MED ORDER — LACTATED RINGERS IV SOLN
INTRAVENOUS | Status: DC
  Administered 2012-08-22: 22:00:00 via INTRAVENOUS

## 2012-08-22 MED ORDER — MORPHINE SULFATE 2 MG/ML IJ SOLN
2.0000 mg | INTRAMUSCULAR | Status: DC | PRN
  Administered 2012-08-22: 2 mg via INTRAVENOUS
  Filled 2012-08-22: qty 1

## 2012-08-22 MED ORDER — FENTANYL CITRATE 0.05 MG/ML IJ SOLN
INTRAMUSCULAR | Status: DC | PRN
  Administered 2012-08-22: 100 ug via INTRAVENOUS
  Administered 2012-08-22 (×2): 50 ug via INTRAVENOUS

## 2012-08-22 MED ORDER — MORPHINE SULFATE 4 MG/ML IJ SOLN
INTRAMUSCULAR | Status: DC | PRN
  Administered 2012-08-22: 4 mg

## 2012-08-22 MED ORDER — LACTATED RINGERS IV SOLN
INTRAVENOUS | Status: DC | PRN
  Administered 2012-08-22: 09:00:00 via INTRAVENOUS

## 2012-08-22 MED ORDER — LIDOCAINE HCL (PF) 1 % IJ SOLN
INTRAMUSCULAR | Status: AC
  Filled 2012-08-22: qty 30

## 2012-08-22 SURGICAL SUPPLY — 34 items
BANDAGE ELASTIC 6 VELCRO ST LF (GAUZE/BANDAGES/DRESSINGS) ×2 IMPLANT
BLADE CUDA 5.5 (BLADE) IMPLANT
BLADE CUTTER GATOR 3.5 (BLADE) ×2 IMPLANT
BLADE GREAT WHITE 4.2 (BLADE) ×2 IMPLANT
BOOTCOVER CLEANROOM LRG (PROTECTIVE WEAR) ×8 IMPLANT
CLOSURE STERI-STRIP 1/4X4 (GAUZE/BANDAGES/DRESSINGS) ×1 IMPLANT
CLOTH BEACON ORANGE TIMEOUT ST (SAFETY) ×2 IMPLANT
DRAPE ARTHROSCOPY W/POUCH 114 (DRAPES) ×2 IMPLANT
DRSG PAD ABDOMINAL 8X10 ST (GAUZE/BANDAGES/DRESSINGS) ×2 IMPLANT
DURAPREP 26ML APPLICATOR (WOUND CARE) ×2 IMPLANT
GLOVE BIO SURGEON STRL SZ7.5 (GLOVE) ×2 IMPLANT
GLOVE BIO SURGEON STRL SZ8 (GLOVE) ×2 IMPLANT
GLOVE EUDERMIC 7 POWDERFREE (GLOVE) ×2 IMPLANT
GLOVE SS BIOGEL STRL SZ 7.5 (GLOVE) ×1 IMPLANT
GLOVE SUPERSENSE BIOGEL SZ 7.5 (GLOVE) ×1
GOWN STRL NON-REIN LRG LVL3 (GOWN DISPOSABLE) ×2 IMPLANT
GOWN STRL REIN XL XLG (GOWN DISPOSABLE) ×4 IMPLANT
KIT BASIN OR (CUSTOM PROCEDURE TRAY) ×2 IMPLANT
KIT ROOM TURNOVER OR (KITS) ×2 IMPLANT
MANIFOLD NEPTUNE II (INSTRUMENTS) ×2 IMPLANT
NDL 18GX1X1/2 (RX/OR ONLY) (NEEDLE) ×1 IMPLANT
NEEDLE 18GX1X1/2 (RX/OR ONLY) (NEEDLE) ×2 IMPLANT
PACK ARTHROSCOPY DSU (CUSTOM PROCEDURE TRAY) ×2 IMPLANT
PAD ARMBOARD 7.5X6 YLW CONV (MISCELLANEOUS) ×4 IMPLANT
PADDING CAST COTTON 6X4 STRL (CAST SUPPLIES) ×2 IMPLANT
RING FOAM WHITE (MISCELLANEOUS) ×2 IMPLANT
SET ARTHROSCOPY TUBING (MISCELLANEOUS) ×2
SET ARTHROSCOPY TUBING LN (MISCELLANEOUS) ×1 IMPLANT
SPONGE GAUZE 4X4 12PLY (GAUZE/BANDAGES/DRESSINGS) ×2 IMPLANT
SPONGE LAP 4X18 X RAY DECT (DISPOSABLE) ×2 IMPLANT
STRIP CLOSURE SKIN 1/2X4 (GAUZE/BANDAGES/DRESSINGS) ×2 IMPLANT
SYR 30ML LL (SYRINGE) ×2 IMPLANT
TOWEL OR 17X24 6PK STRL BLUE (TOWEL DISPOSABLE) ×2 IMPLANT
WATER STERILE IRR 1000ML POUR (IV SOLUTION) ×2 IMPLANT

## 2012-08-22 NOTE — Transfer of Care (Signed)
Immediate Anesthesia Transfer of Care Note  Patient: Alejandro [name]  Procedure(s) Performed: Procedure(s): RIGHT KNEE ARTHROSCOPY WITH DEBRIDEMENT  (Right)  Patient Location: PACU  Anesthesia Type:General  Level of Consciousness: awake, alert , oriented and patient cooperative  Airway & Oxygen Therapy: Patient Spontanous Breathing and Patient connected to nasal cannula oxygen  Post-op Assessment: Report given to PACU RN, Post -op Vital signs reviewed and stable and Patient moving all extremities X 4  Post vital signs: Reviewed and stable  Complications: No apparent anesthesia complications

## 2012-08-22 NOTE — Anesthesia Postprocedure Evaluation (Signed)
  Anesthesia Post-op Note  Patient: Alejandro [name]  Procedure(s) Performed: Procedure(s): RIGHT KNEE ARTHROSCOPY WITH DEBRIDEMENT  (Right)  Patient Location: PACU  Anesthesia Type:General  Level of Consciousness: awake, alert , oriented and patient cooperative  Airway and Oxygen Therapy: Patient Spontanous Breathing  Post-op Pain: mild  Post-op Assessment: Post-op Vital signs reviewed, Patient's Cardiovascular Status Stable, Respiratory Function Stable, Patent Airway, No signs of Nausea or vomiting and Pain level controlled  Post-op Vital Signs: stable  Complications: No apparent anesthesia complications

## 2012-08-22 NOTE — Preoperative (Signed)
Beta Blockers   Reason not to administer Beta Blockers:Not Applicable 

## 2012-08-22 NOTE — Op Note (Signed)
08/22/2012  10:46 AM  PATIENT:   Alejandro [name]  52 y.o. male  PRE-OPERATIVE DIAGNOSIS:  RIGHT KNEE MEDIAL MENISCAL TEAR   POST-OPERATIVE DIAGNOSIS:  same  PROCEDURE:  RKA, partial MM  SURGEON:  Neshia Mckenzie, Vania Rea. M.D.  ASSISTANTS: Shuford pac   ANESTHESIA:   LMA + local  EBL: min  SPECIMEN:  none  Drains: none   PATIENT DISPOSITION:  PACU - hemodynamically stable.    PLAN OF CARE: Admit for overnight observation  Dictation# [id number]

## 2012-08-22 NOTE — Progress Notes (Signed)
Orthopedic Tech Progress Note Patient Details:  Alejandro [name] 1960-07-07 [id number] Crutches delivered. Crutch training not performed at this time. Patient sleep and unable to get up.   Ortho Devices Type of Ortho Device: Crutches Ortho Device/Splint Interventions: Ordered   [place] R Thompson 08/22/2012, 3:20 PM

## 2012-08-22 NOTE — Anesthesia Preprocedure Evaluation (Addendum)
Anesthesia Evaluation  Patient identified by MRN, date of birth, ID band Patient awake    Reviewed: Allergy & Precautions, H&P , NPO status , Patient's Chart, lab work & pertinent test results  History of Anesthesia Complications (+) PONV  Airway Mallampati: II TM Distance: >3 FB Neck ROM: Full    Dental  (+) Dental Advisory Given   Pulmonary sleep apnea ,  Does not tolerate CPAP         Cardiovascular     Neuro/Psych    GI/Hepatic GERD-  Medicated and Controlled,  Endo/Other    Renal/GU      Musculoskeletal   Abdominal   Peds  Hematology   Anesthesia Other Findings   Reproductive/Obstetrics                          Anesthesia Physical Anesthesia Plan  ASA: III  Anesthesia Plan: General   Post-op Pain Management:    Induction: Intravenous  Airway Management Planned: LMA and Oral ETT  Additional Equipment:   Intra-op Plan:   Post-operative Plan: Extubation in OR  Informed Consent: I have reviewed the patients History and Physical, chart, labs and discussed the procedure including the risks, benefits and alternatives for the proposed anesthesia with the patient or authorized representative who has indicated his/her understanding and acceptance.     Plan Discussed with: CRNA, Anesthesiologist and Surgeon  Anesthesia Plan Comments:         Anesthesia Quick Evaluation

## 2012-08-22 NOTE — H&P (Signed)
Alejandro [name]    Chief Complaint: RIGHT KNEE MEDIAL MENISCAL TEAR  HPI: The patient is a 52 y.o. male with chronic right medial knee pain  Past Medical History  Diagnosis Date  . Sleep apnea syndrome     complex apnea , baseline AHI of 44 on 04-21-11, changed to adapt SV    . High cholesterol   . Central sleep apnea     AHI of 44 exacerbated to 52 and failed CPAP  . Allergy   . Complication of anesthesia     Patient has hard time waking up  . Psoriasis   . Cancer     polpy removed from colon that was cancerous  . GERD (gastroesophageal reflux disease)   . PONV (postoperative nausea and vomiting)     Past Surgical History  Procedure Laterality Date  . Colonoscopy  2009    CANCEROUS POLYPS REMOVED  . Knee arthroscopy Left 1979  . Wrist surgery Left     Family History  Problem Relation Age of Onset  . Sleep disorder Father   . Sleep disorder Paternal Grandfather   . Cancer Paternal Grandmother     breast    Social History:  reports that he has never smoked. He does not have any smokeless tobacco history on file. He reports that  drinks alcohol. He reports that he does not use illicit drugs.  Allergies:  Allergies  Allergen Reactions  . Cortisone   . Depo-Medrol [Methylprednisolone Acetate] Other (See Comments)    Blurry vision- blisters behind retinas  . Codeine     REACTION: nausea, vomiting  . Other     Does/t likt narcotics and cannot take steroids     Medications Prior to Admission  Medication Sig Dispense Refill  . Calcipotriene 0.005 % solution Apply 1 applicator topically 2 (two) times daily.  60 mL  2  . fluticasone (CUTIVATE) 0.05 % cream Apply topically 2 (two) times daily. PATIENT NEEDS OFFICE VISIT FOR ADDITIONAL REFILLS  30 g  0  . omeprazole (PRILOSEC) 20 MG capsule Take 20 mg by mouth daily.         Physical Exam: right knee with med  Vitals  Temp:  [97 F (36.1 C)] 97 F (36.1 C) (08/21 0809) Pulse Rate:  [67] 67 (08/21 0809) Resp:   [16] 16 (08/21 0809) BP: (128)/(79) 128/79 mmHg (08/21 0809) SpO2:  [97 %] 97 % (08/21 0809)  Assessment/Plan  Impression: RIGHT KNEE MEDIAL MENISCAL TEAR   Plan of Action: Procedure(s): RIGHT KNEE ARTHROSCOPY WITH DEBRIDEMENT   Marrisa Kimber M 08/22/2012, 9:17 AM

## 2012-08-22 NOTE — Anesthesia Procedure Notes (Signed)
Procedure Name: LMA Insertion Date/Time: 08/22/2012 10:03 AM Performed by: Rogelia Boga Pre-anesthesia Checklist: Patient identified, Emergency Drugs available, Suction available, Patient being monitored and Timeout performed Patient Re-evaluated:Patient Re-evaluated prior to inductionOxygen Delivery Method: Circle system utilized Preoxygenation: Pre-oxygenation with 100% oxygen Intubation Type: IV induction LMA: LMA inserted LMA Size: 4.0 Tube type: Oral Number of attempts: 1 Placement Confirmation: positive ETCO2 and breath sounds checked- equal and bilateral Tube secured with: Tape Dental Injury: Teeth and Oropharynx as per pre-operative assessment

## 2012-08-23 ENCOUNTER — Encounter (HOSPITAL_COMMUNITY): Payer: Self-pay | Admitting: Orthopedic Surgery

## 2012-08-23 NOTE — Discharge Summary (Signed)
PATIENT ID:      Alejandro [name]  MRN:     [id number] DOB/AGE:    [birth year] 52 y.o.     DISCHARGE SUMMARY  ADMISSION DATE:    08/22/2012 DISCHARGE DATE:    ADMISSION DIAGNOSIS: RIGHT KNEE MEDIAL MENISCAL TEAR  Past Medical History  Diagnosis Date  . High cholesterol   . Central sleep apnea     AHI of 44 exacerbated to 52 and failed CPAP  . Allergy   . Complication of anesthesia     Patient has hard time waking up  . Psoriasis   . GERD (gastroesophageal reflux disease)   . PONV (postoperative nausea and vomiting)   . Complex sleep apnea syndrome     baseline AHI of 44 on 04-21-11, changed to adapt SV    . Colon cancer     polpy removed from colon that was cancerous    DISCHARGE DIAGNOSIS:   Active Problems:   * No active hospital problems. *   PROCEDURE: Procedure(s): RIGHT KNEE ARTHROSCOPY WITH DEBRIDEMENT  on 08/22/2012  CONSULTS:     HISTORY:  See H&P in chart.  HOSPITAL COURSE:  Khian Remo is a 52 y.o. admitted on 08/22/2012 with a chief complaint of right knee pain, and found to have a diagnosis of RIGHT KNEE MEDIAL MENISCAL TEAR .  They were brought to the operating room on 08/22/2012 and underwent Procedure(s): RIGHT KNEE ARTHROSCOPY WITH DEBRIDEMENT .    They were given perioperative antibiotics: Anti-infectives   Start     Dose/Rate Route Frequency Ordered Stop   08/22/12 0600  ceFAZolin (ANCEF) 3 g in dextrose 5 % 50 mL IVPB     3 g 160 mL/hr over 30 Minutes Intravenous On call to O.R. 08/21/12 1257 08/22/12 1010    .  Patient underwent the above named procedure and tolerated it well. The following day they were hemodynamically stable and pain was controlled . They were neurovascularly intact to the operative extremity. They stayed overnight due to sever sleep apnea and his oxygenation status was good post op day 1   DIAGNOSTIC STUDIES:  RECENT RADIOGRAPHIC STUDIES :  No results found.  RECENT VITAL SIGNS:  Patient Vitals for the past 24 hrs:  BP  Temp Temp src Pulse Resp SpO2 Height Weight  08/23/12 0645 106/73 mmHg 98.1 F (36.7 C) Oral 58 16 96 % - -  08/22/12 2240 110/70 mmHg 98.5 F (36.9 C) Oral 57 16 99 % - -  08/22/12 1430 118/81 mmHg 97.6 F (36.4 C) - 54 17 100 % - -  08/22/12 1420 - - - - - - 5\' 5"  (1.651 m) 72.576 kg (160 lb)  08/22/12 1345 113/79 mmHg 97.4 F (36.3 C) - 55 18 100 % - -  08/22/12 1330 - - - 55 11 100 % - -  08/22/12 1315 124/79 mmHg - - 58 23 99 % - -  08/22/12 1300 - - - 58 13 99 % - -  08/22/12 1245 - - - 81 15 100 % - -  08/22/12 1230 - - - 72 14 100 % - -  08/22/12 1215 122/78 mmHg 97.5 F (36.4 C) - 71 19 100 % - -  08/22/12 1200 125/84 mmHg - - 72 12 100 % - -  08/22/12 1145 132/80 mmHg - - 80 15 100 % - -  08/22/12 1130 133/89 mmHg - - 77 16 100 % - -  08/22/12 1115 122/82 mmHg 96.9 F (36.1 C) -  81 12 100 % - -  08/22/12 1100 132/96 mmHg - - 81 9 100 % - -  08/22/12 1057 135/86 mmHg 96.6 F (35.9 C) - 90 12 100 % - -  08/22/12 0809 128/79 mmHg 97 F (36.1 C) Oral 67 16 97 % - -  .  RECENT EKG RESULTS:   No orders found for this or any previous visit.  DISCHARGE INSTRUCTIONS:   Future Appointments Provider Department Dept Phone   08/30/2012 9:30 AM Nilda Riggs, NP [facility] [phone]      DISCHARGE MEDICATIONS:     Medication List         Calcipotriene 0.005 % solution  Apply 1 applicator topically 2 (two) times daily.     fluticasone 0.05 % cream  Commonly known as:  CUTIVATE  Apply topically 2 (two) times daily. PATIENT NEEDS OFFICE VISIT FOR ADDITIONAL REFILLS     omeprazole 20 MG capsule  Commonly known as:  PRILOSEC  Take 20 mg by mouth daily.     oxyCODONE-acetaminophen 5-325 MG per tablet  Commonly known as:  PERCOCET  Take 1-2 tablets by mouth every 4 (four) hours as needed for pain.        FOLLOW UP VISIT:       Follow-up Information   Follow up with Senaida Lange, MD. (call to be seen in 7-10 days)    Specialty:   Orthopedic Surgery   Contact information:   [address] [place] [address] [phone]       DISCHARGE TO: Home  DISPOSITION: good  DISCHARGE CONDITION:  Rodolph Bong for Dr. Francena Hanly 08/23/2012, 7:41 AM

## 2012-08-23 NOTE — Op Note (Signed)
NAMEEMMITTE, SURGEON NO.:  [id number]  MEDICAL RECORD NO.:  [id number]  LOCATION:  [facility]                         FACILITY:  [facility]  PHYSICIAN:  Vania Rea. Anshi Jalloh, M.D.  DATE OF BIRTH:  1960/09/09  DATE OF PROCEDURE: DATE OF DISCHARGE:                              OPERATIVE REPORT   PREOPERATIVE DIAGNOSIS:  Chronic right medial knee pain with MRI and clinical evidence from the meniscus tear.  POSTOPERATIVE DIAGNOSIS:  Right knee medial meniscus tear.  PROCEDURE: 1. Right knee diagnostic arthroscopy. 2. Partial medial meniscectomy.  SURGEON:  Vania Rea. Renia Mikelson, M.D.  ASSIST:  French Ana A. Shuford, PA-C.  ANESTHESIA:  LMA general as well as local.  TOURNIQUET TIME:  None was used.  BLOOD LOSS:  Minimal.  DRAINS:  None.  HISTORY:  Mr. Grenz is a 52 year old gentleman who has had persistent activity-related right medial knee pain with mechanical symptoms that had been refractory to prolonged attempts at conservative management. Preoperative MRI scan confirms complex medial meniscal tear.  Due to his ongoing pain, functional limitations, and mechanical symptoms, he is brought to the operating room at this time for planned right knee arthroscopy as described below.  Preoperatively, I counseled Mr. Tillison on treatment options as well as risks versus benefits thereof.  Possible surgical complications were reviewed including potential for bleeding, infection, neurovascular injury, DVT, PE, as well as persistent pain.  He understands and accepts and agrees with our planned procedure.  PROCEDURE IN DETAIL:  After undergoing routine preop evaluation, the patient received prophylactic antibiotics and brought to the operating room, placed supine on the operating table, underwent smooth induction of a LMA general anesthesia.  The right leg was placed in a leg holder and sterilely prepped and draped in standard fashion.  Time-out was called.  Standard arthroscopy  portals were established and diagnostic arthroscopy was performed.  The suprapatellar pouch and gutters were cleared of any loose bodies.  Patellofemoral joint showed normal articular cartilage and normal patellar tracking.  The intercondylar notch showed cruciate ligaments to be intact.  Medially, there was a complex tear involving the posterior half of the medial meniscus with a large pedunculated fragment of meniscal tissue emanating from the mid third of the meniscal rim.  The large meniscal fragment was amputated with a basket and removed as a single piece and then basket was used to trim the rim of the medial meniscus back to a stable margin.  I would estimate approximately 40% to 50% meniscus was excised.  The articular surfaces were in good condition.  Laterally, the articular surfaces were in good condition.  The meniscus was stable and intact.  At this point, final inspection and irrigation was then completed.  Fluid and instruments were removed.  Combination of Marcaine and morphine were instilled in the knee joint, additional Marcaine about the portals. Portal was closed with Steri-Strips.  Dry dressing.  The knee was wrapped with Ace bandage and support stocking.  The patient was awakened, extubated, and taken to recovery room in stable condition.     Vania Rea. Marlys Stegmaier, M.D.     KMS/MEDQ  D:  08/22/2012  T:  08/22/2012  Job:  [id number]

## 2012-08-30 ENCOUNTER — Ambulatory Visit: Payer: BC Managed Care – PPO | Admitting: Nurse Practitioner

## 2012-12-22 ENCOUNTER — Ambulatory Visit (INDEPENDENT_AMBULATORY_CARE_PROVIDER_SITE_OTHER): Payer: BC Managed Care – PPO | Admitting: Physician Assistant

## 2012-12-22 VITALS — BP 122/68 | HR 75 | Temp 98.4°F | Resp 18 | Ht 65.5 in | Wt 166.4 lb

## 2012-12-22 DIAGNOSIS — L259 Unspecified contact dermatitis, unspecified cause: Secondary | ICD-10-CM

## 2012-12-22 DIAGNOSIS — R059 Cough, unspecified: Secondary | ICD-10-CM

## 2012-12-22 DIAGNOSIS — R05 Cough: Secondary | ICD-10-CM

## 2012-12-22 DIAGNOSIS — R0981 Nasal congestion: Secondary | ICD-10-CM

## 2012-12-22 DIAGNOSIS — J329 Chronic sinusitis, unspecified: Secondary | ICD-10-CM

## 2012-12-22 DIAGNOSIS — J3489 Other specified disorders of nose and nasal sinuses: Secondary | ICD-10-CM

## 2012-12-22 DIAGNOSIS — L309 Dermatitis, unspecified: Secondary | ICD-10-CM

## 2012-12-22 MED ORDER — BENZONATATE 200 MG PO CAPS: 200.0000 mg | ORAL_CAPSULE | Freq: Two times a day (BID) | ORAL | Status: DC | PRN

## 2012-12-22 MED ORDER — AZITHROMYCIN 500 MG PO TABS: 500.0000 mg | ORAL_TABLET | Freq: Every day | ORAL | Status: DC

## 2012-12-22 MED ORDER — CALCIPOTRIENE 0.005 % EX SOLN: 1.0000 | Freq: Two times a day (BID) | CUTANEOUS | Status: DC

## 2012-12-22 NOTE — Progress Notes (Signed)
   Subjective:    Patient ID: Alejandro [name], male    DOB: 11-Aug-1960, 52 y.o.   MRN: [id number]  Cough Associated symptoms include postnasal drip and rhinorrhea. Pertinent negatives include no chills, ear pain, fever, headaches, sore throat, shortness of breath or wheezing.   Patient presents for evaluation of 2 day history of nasal congestion, PND, cough, bilateral ear pressure, and sinus pressure.  Has hx of sinus infection with last about 2 years ago. Denies fever, chills, nausea, vomiting, headache, dizziness, sore throat, otalgia, SOB, wheezing, or chest pain. He has not taken any OTC medications for this.  No known flu contacts Patient is otherwise healthy with no other concerns today.     Review of Systems  Constitutional: Negative for fever and chills.  HENT: Positive for congestion, postnasal drip, rhinorrhea and sinus pressure. Negative for ear pain and sore throat.   Respiratory: Positive for cough. Negative for shortness of breath and wheezing.   Gastrointestinal: Negative for nausea, vomiting and abdominal pain.  Neurological: Negative for dizziness and headaches.       Objective:   Physical Exam  Constitutional: He is oriented to person, place, and time. He appears well-developed and well-nourished.  HENT:  Head: Normocephalic and atraumatic.  Right Ear: Hearing, tympanic membrane, external ear and ear canal normal.  Left Ear: Hearing, tympanic membrane, external ear and ear canal normal.  Nose: Right sinus exhibits maxillary sinus tenderness. Right sinus exhibits no frontal sinus tenderness. Left sinus exhibits maxillary sinus tenderness. Left sinus exhibits no frontal sinus tenderness.  Mouth/Throat: Uvula is midline and mucous membranes are normal. No oropharyngeal exudate.  Eyes: Conjunctivae are normal.  Neck: Normal range of motion. Neck supple.  Cardiovascular: Normal rate, regular rhythm and normal heart sounds.   Pulmonary/Chest: Effort normal and breath sounds  normal.  Lymphadenopathy:    He has no cervical adenopathy.  Neurological: He is alert and oriented to person, place, and time.  Psychiatric: He has a normal mood and affect. His behavior is normal. Judgment and thought content normal.          Assessment & Plan:  Sinusitis - Plan: azithromycin (ZITHROMAX) 500 MG tablet  Cough - Plan: benzonatate (TESSALON) 200 MG capsule  Nasal congestion  Dermatitis - Plan: Calcipotriene 0.005 % solution  Start zithromax 500 mg daily x 3 days Tessalon perles tid prn cough Increase fluids and rest Recommend OTC Mucinex as directed Follow up if symptoms worsen or fail to improve.   Pt also requested refill of calcipotriene cream which I did provide.

## 2013-07-09 ENCOUNTER — Ambulatory Visit (INDEPENDENT_AMBULATORY_CARE_PROVIDER_SITE_OTHER): Payer: BC Managed Care – PPO | Admitting: Internal Medicine

## 2013-07-09 ENCOUNTER — Encounter: Payer: Self-pay | Admitting: Internal Medicine

## 2013-07-09 VITALS — BP 121/82 | HR 75 | Temp 98.4°F | Resp 16 | Ht 65.0 in | Wt 162.0 lb

## 2013-07-09 DIAGNOSIS — Z125 Encounter for screening for malignant neoplasm of prostate: Secondary | ICD-10-CM

## 2013-07-09 DIAGNOSIS — L408 Other psoriasis: Secondary | ICD-10-CM

## 2013-07-09 DIAGNOSIS — L409 Psoriasis, unspecified: Secondary | ICD-10-CM

## 2013-07-09 DIAGNOSIS — E785 Hyperlipidemia, unspecified: Secondary | ICD-10-CM

## 2013-07-09 DIAGNOSIS — H524 Presbyopia: Secondary | ICD-10-CM

## 2013-07-09 DIAGNOSIS — Z0000 Encounter for general adult medical examination without abnormal findings: Secondary | ICD-10-CM

## 2013-07-09 DIAGNOSIS — H8110 Benign paroxysmal vertigo, unspecified ear: Secondary | ICD-10-CM

## 2013-07-09 DIAGNOSIS — G4730 Sleep apnea, unspecified: Secondary | ICD-10-CM

## 2013-07-09 LAB — CBC WITH DIFFERENTIAL/PLATELET
Basophils Absolute: 0.1 10*3/uL (ref 0.0–0.1)
Basophils Relative: 1 % (ref 0–1)
EOS PCT: 3 % (ref 0–5)
Eosinophils Absolute: 0.2 10*3/uL (ref 0.0–0.7)
HCT: 42.6 % (ref 39.0–52.0)
Hemoglobin: 15 g/dL (ref 13.0–17.0)
LYMPHS ABS: 1.1 10*3/uL (ref 0.7–4.0)
LYMPHS PCT: 18 % (ref 12–46)
MCH: 28.5 pg (ref 26.0–34.0)
MCHC: 35.2 g/dL (ref 30.0–36.0)
MCV: 81 fL (ref 78.0–100.0)
Monocytes Absolute: 0.5 10*3/uL (ref 0.1–1.0)
Monocytes Relative: 9 % (ref 3–12)
Neutro Abs: 4.2 10*3/uL (ref 1.7–7.7)
Neutrophils Relative %: 69 % (ref 43–77)
PLATELETS: 261 10*3/uL (ref 150–400)
RBC: 5.26 MIL/uL (ref 4.22–5.81)
RDW: 13.7 % (ref 11.5–15.5)
WBC: 6.1 10*3/uL (ref 4.0–10.5)

## 2013-07-09 MED ORDER — FLUTICASONE PROPIONATE 0.05 % EX CREA: TOPICAL_CREAM | Freq: Two times a day (BID) | CUTANEOUS | Status: DC

## 2013-07-09 MED ORDER — CALCIPOTRIENE 0.005 % EX SOLN: 1.0000 "application " | Freq: Two times a day (BID) | CUTANEOUS | Status: DC

## 2013-07-09 NOTE — Progress Notes (Signed)
Subjective:  This chart was scribed for Dr. Linton Ham. Alejandro Pastor, MD   by Stacy Gardner, [facility] Scribe. The patient was seen in room and the patient's care was started at 11:38 AM.   Patient ID: Alejandro [name], male    DOB: 03/23/1960, 53 y.o.   MRN: [id number]  07/09/2013  Annual Exam   HPI HPI Comments: Elver Stadler is a 53 y.o. male who arrives to the [facility] for an annual exam.  He has central and obstructive apnea. He is not using the C-PAP machine anymore because he isunable to fall asleep. He mentions the mask is uncomfortable and he unknowingly pulls the mask off when he sleeps.Denies any fatigue or changes in his energy . Pt feels slightly dizzy more often.  He has far sightedness that is getting progressively worse. He has never been an ophthalmologist.   Notes episodes of room spinning, dry heaving, nausea and vomiting for 12 hours,  once every three years after a plane ride. Pt reports his last episode occurred three days after a plane ride to [place] but most instances occurs immediately after the plane ride. He has ongoing soft stool and denies diarrhea. He has tried dramamine. For the past two weeks he has dizziness when standing up too fast and associated vision blurriness.   Pt has a past medical hx of high cholesterol and sleep apnea.   Denies any issues related to allergies.  Denies urgency and frequency.  Pt stopped taking his simvastatin medication years ago because he thought it was causing him joint pain. He denies any joint pain since that time.  Pt works in the [employer]. He likes to play golf. Pt's daughter is a Museum/gallery exhibitions officer at [employer] and his youngest daughter goes to [employer].    Review of Systems  Constitutional: Negative for fatigue.  Eyes: Positive for visual disturbance.  Gastrointestinal:       Sporadic nausea and vomiting.   Genitourinary: Negative for urgency and  frequency.  Musculoskeletal: Negative for arthralgias.  Neurological: Positive for dizziness.       Spotmatic dizziness  Psychiatric/Behavioral: Positive for sleep disturbance.  rest of ROS neg  Past Medical History  Diagnosis Date  . High cholesterol   . Central sleep apnea     AHI of 44 exacerbated to 52 and failed CPAP  . Allergy   . Complication of anesthesia     Patient has hard time waking up  . Psoriasis   . GERD (gastroesophageal reflux disease)   . PONV (postoperative nausea and vomiting)   . Complex sleep apnea syndrome     baseline AHI of 44 on 04-21-11, changed to adapt SV    . Colon cancer     polpy removed from colon that was cancerous///followed at [facility] q 6 mos.   Allergies  Allergen Reactions  . Cortisone   . Depo-Medrol [Methylprednisolone Acetate] Other (See Comments)    Blurry vision- blisters behind retinas  . Codeine     REACTION: nausea, vomiting  . Other     Does/t likt narcotics and cannot take steroids    Current Outpatient Prescriptions  Medication Sig Dispense Refill  . azithromycin (ZITHROMAX) 500 MG tablet Take 1 tablet (500 mg total) by mouth daily.  3 tablet  0  . benzonatate (TESSALON) 200 MG capsule Take 1 capsule (200 mg total) by mouth 2 (two) times daily as needed for cough.  20 capsule  0  .  Calcipotriene 0.005 % solution Apply 1 applicator topically 2 (two) times daily.  60 mL  2  . fluticasone (CUTIVATE) 0.05 % cream Apply topically 2 (two) times daily. PATIENT NEEDS OFFICE VISIT FOR ADDITIONAL REFILLS  30 g  0  . omeprazole (PRILOSEC) 20 MG capsule Take 20 mg by mouth daily.      Marland Kitchen oxyCODONE-acetaminophen (PERCOCET) 5-325 MG per tablet Take 1-2 tablets by mouth every 4 (four) hours as needed for pain.  40 tablet  0   No current facility-administered medications for this visit.       Objective:    BP 121/82  Pulse 75  Temp(Src) 98.4 F (36.9 C)  Resp 16  Ht _0  (1.651 m)  Wt 162 lb (73.483 kg)  BMI 26.96 kg/m2  SpO2  97% DPhysical Exam  Nursing note and vitals reviewed. Constitutional: He is oriented to person, place, and time. He appears well-developed and well-nourished. No distress.  HENT:  Head: Normocephalic and atraumatic.  Right Ear: External ear normal.  Left Ear: External ear normal.  Nose: Nose normal.  Mouth/Throat: Oropharynx is clear and moist.  Tms and canals clear  Eyes: Conjunctivae and EOM are normal. Pupils are equal, round, and reactive to light.  Neck: Normal range of motion. Neck supple. No tracheal deviation present. No thyromegaly present.  Cardiovascular: Normal rate, regular rhythm, normal heart sounds and intact distal pulses.   No murmur heard. Pulmonary/Chest: Effort normal and breath sounds normal. No respiratory distress. He has no wheezes. He has no rales.  Abdominal: Soft. Bowel sounds are normal. He exhibits no distension and no mass. There is no hepatosplenomegaly, splenomegaly or hepatomegaly. There is no tenderness. There is no rebound and no guarding.  No hepatosplenomegaly  Musculoskeletal: Normal range of motion. He exhibits no edema and no tenderness.  Lymphadenopathy:    He has no cervical adenopathy.  Neurological: He is alert and oriented to person, place, and time. He has normal reflexes. He displays normal reflexes. No cranial nerve deficit. He exhibits normal muscle tone. Coordination normal.  romb neg/gait stable  Skin: Skin is warm and dry.  psor plaq glut cleft/beh ears  Psychiatric: He has a normal mood and affect. His behavior is normal. Judgment and thought content normal.        Assessment & Plan:  Routine general medical examination at a health care facility -HM UTD  Sleep apnea - Plan: TSH/urged to pursue better mask w/ neuro  Other and unspecified hyperlipidemia off meds - Plan: Comprehensive metabolic panel, Lipid panel  Benign paroxysmal positional vertigo, unspecified laterality - Plan: Comprehensive metabolic panel,  TSH  Brandt-Darhoff then ent if fails  Psoriasis - Plan: Calcipotriene 0.005 % solution, fluticasone (CUTIVATE) 0.05 % cream  Screening for prostate cancer - Plan: PSA  Presbyopia - Plan: Ambulatory referral to [facility] ordered this encounter  Medications  . DISCONTD: Calcipotriene 0.005 % solution    Sig: Apply topically 2 (two) times daily.  . Calcipotriene 0.005 % solution    Sig: Apply 1 application topically 2 (two) times daily.    Dispense:  60 mL    Refill:  11  . fluticasone (CUTIVATE) 0.05 % cream    Sig: Apply topically 2 (two) times daily.    Dispense:  30 g    Refill:  5   Results for orders placed in visit on 07/09/13  CBC WITH DIFFERENTIAL      Result Value Ref Range   WBC 6.1  4.0 -  10.5 K/uL   RBC 5.26  4.22 - 5.81 MIL/uL   Hemoglobin 15.0  13.0 - 17.0 g/dL   HCT 42.6  39.0 - 52.0 %   MCV 81.0  78.0 - 100.0 fL   MCH 28.5  26.0 - 34.0 pg   MCHC 35.2  30.0 - 36.0 g/dL   RDW 13.7  11.5 - 15.5 %   Platelets 261  150 - 400 K/uL   Neutrophils Relative % 69  43 - 77 %   Neutro Abs 4.2  1.7 - 7.7 K/uL   Lymphocytes Relative 18  12 - 46 %   Lymphs Abs 1.1  0.7 - 4.0 K/uL   Monocytes Relative 9  3 - 12 %   Monocytes Absolute 0.5  0.1 - 1.0 K/uL   Eosinophils Relative 3  0 - 5 %   Eosinophils Absolute 0.2  0.0 - 0.7 K/uL   Basophils Relative 1  0 - 1 %   Basophils Absolute 0.1  0.0 - 0.1 K/uL   Smear Review Criteria for review not met    COMPREHENSIVE METABOLIC PANEL      Result Value Ref Range   Sodium 141  135 - 145 mEq/L   Potassium 4.3  3.5 - 5.3 mEq/L   Chloride 103  96 - 112 mEq/L   CO2 27  19 - 32 mEq/L   Glucose, Bld 80  70 - 99 mg/dL   BUN 16  6 - 23 mg/dL   Creat 0.84  0.50 - 1.35 mg/dL   Total Bilirubin 0.5  0.2 - 1.2 mg/dL   Alkaline Phosphatase 112  39 - 117 U/L   AST 19  0 - 37 U/L   ALT 18  0 - 53 U/L   Total Protein 7.0  6.0 - 8.3 g/dL   Albumin 4.6  3.5 - 5.2 g/dL   Calcium 9.6  8.4 - 10.5 mg/dL  LIPID PANEL       Result Value Ref Range   Cholesterol 281 (*) 0 - 200 mg/dL   Triglycerides 161 (*) <150 mg/dL   HDL 47  >39 mg/dL   Total CHOL/HDL Ratio 6.0     VLDL 32  0 - 40 mg/dL   LDL Cholesterol 202 (*) 0 - 99 mg/dL  PSA      Result Value Ref Range   PSA 0.86  <=4.00 ng/mL  TSH      Result Value Ref Range   TSH 2.226  0.350 - 4.500 uIU/mL    ?crestor     I have completed the patient encounter in its entirety as documented by the scribe, with editing by me where necessary. Freddie Dymek P. Alejandro [name], M.D.

## 2013-07-10 ENCOUNTER — Telehealth: Payer: Self-pay

## 2013-07-10 ENCOUNTER — Encounter: Payer: Self-pay | Admitting: Internal Medicine

## 2013-07-10 LAB — COMPREHENSIVE METABOLIC PANEL
ALBUMIN: 4.6 g/dL (ref 3.5–5.2)
ALT: 18 U/L (ref 0–53)
AST: 19 U/L (ref 0–37)
Alkaline Phosphatase: 112 U/L (ref 39–117)
BUN: 16 mg/dL (ref 6–23)
CALCIUM: 9.6 mg/dL (ref 8.4–10.5)
CHLORIDE: 103 meq/L (ref 96–112)
CO2: 27 meq/L (ref 19–32)
CREATININE: 0.84 mg/dL (ref 0.50–1.35)
Glucose, Bld: 80 mg/dL (ref 70–99)
Potassium: 4.3 mEq/L (ref 3.5–5.3)
SODIUM: 141 meq/L (ref 135–145)
TOTAL PROTEIN: 7 g/dL (ref 6.0–8.3)
Total Bilirubin: 0.5 mg/dL (ref 0.2–1.2)

## 2013-07-10 LAB — LIPID PANEL
CHOLESTEROL: 281 mg/dL — AB (ref 0–200)
HDL: 47 mg/dL (ref >39)
LDL Cholesterol: 202 mg/dL — ABNORMAL HIGH (ref 0–99)
Total CHOL/HDL Ratio: 6 Ratio
Triglycerides: 161 mg/dL — ABNORMAL HIGH (ref <150)
VLDL: 32 mg/dL (ref 0–40)

## 2013-07-10 LAB — PSA: PSA: 0.86 ng/mL (ref <=4.00)

## 2013-07-10 LAB — TSH: TSH: 2.226 u[IU]/mL (ref 0.350–4.500)

## 2013-07-10 NOTE — Telephone Encounter (Signed)
Can't "measure" anything that prilosec does--it does stop production of stomach acid---question is, can he do without or not?

## 2013-07-10 NOTE — Telephone Encounter (Signed)
Lab letter sent-please comment on the Prilosec. Thanks

## 2013-07-10 NOTE — Telephone Encounter (Signed)
LM advising pt ok to take medication. He is to [facility] next January for a follow up.

## 2013-07-10 NOTE — Telephone Encounter (Signed)
Patient says he has been taking prilosec for over a year, he says when he does not take it, he has horrible acid reflux. Patient wants to know if it would become a problem if he takes the medication for the rest of his life.

## 2013-07-10 NOTE — Telephone Encounter (Signed)
Patient called stating he has been taking the Prilosec now for 1 year everyday.Patient is concerned  taking the Prilosec this long if it has had a bad effect on his body.Per patient he had blood work done yesterday and wants to know if the blood work would answer his question. I informed patient that blood work usually takes about a week before we would get any results. Patient request a called back at [phone]

## 2013-07-14 NOTE — Telephone Encounter (Signed)
Spoke to pt- he is not able to stop taking medication. He experiences an increase in acid reflux by discontinuing medication even for a day. He has accidentally missed doses and had horrible reflux. Advised pt to keep up with his follow up and routine blood work to monitor his other body systems. He understands to follow up routinely.

## 2013-07-25 ENCOUNTER — Telehealth: Payer: Self-pay

## 2013-07-25 DIAGNOSIS — E785 Hyperlipidemia, unspecified: Secondary | ICD-10-CM

## 2013-07-25 DIAGNOSIS — L409 Psoriasis, unspecified: Secondary | ICD-10-CM

## 2013-07-25 MED ORDER — CALCIPOTRIENE 0.005 % EX SOLN: 1.0000 "application " | Freq: Two times a day (BID) | CUTANEOUS | Status: DC

## 2013-07-25 MED ORDER — FLUTICASONE PROPIONATE 0.05 % EX CREA: TOPICAL_CREAM | Freq: Two times a day (BID) | CUTANEOUS | Status: DC

## 2013-07-25 MED ORDER — ROSUVASTATIN CALCIUM 10 MG PO TABS: 10.0000 mg | ORAL_TABLET | Freq: Every day | ORAL | Status: DC

## 2013-07-25 NOTE — Telephone Encounter (Signed)
Pt advised.

## 2013-07-25 NOTE — Telephone Encounter (Signed)
Meds ordered this encounter  Medications  . rosuvastatin (CRESTOR) 10 MG tablet    Sig: Take 1 tablet (10 mg total) by mouth daily.    Dispense:  90 tablet    Refill:  3    Order Specific Question:  Supervising Provider    Answer:  DOOLITTLE, ROBERT P [[id number]]

## 2013-07-25 NOTE — Telephone Encounter (Signed)
Prescriptions for psoriasis were sent to his pharmacy during his OV with Dr. Laney Pastor. Resent- pharmacy denies receipt.   Labs are back for Lipid panel. Please advise the Crestor medication. Chart review pt has not been on Crestor in the past.

## 2013-07-25 NOTE — Telephone Encounter (Signed)
Patient states he saw Dr. Laney Pastor recently and he was advised that Dr. Laney Pastor would be calling in Crestor and refilling 2 other medications for psoriasis. [facility]acy: [facility] [address]. Please return call and advise. Patient states he was seen a week or two ago. Cb # [phone]

## 2013-07-28 ENCOUNTER — Telehealth: Payer: Self-pay

## 2013-07-28 NOTE — Telephone Encounter (Signed)
PT STATES HE HAVE QUESTIONS REGARDING HIS MEDICATION. [place] [phone]

## 2013-07-29 NOTE — Telephone Encounter (Signed)
Pt would like to change from crestor to a statin as this was too exopensive.  Please advise

## 2013-07-30 MED ORDER — ATORVASTATIN CALCIUM 40 MG PO TABS: 40.0000 mg | ORAL_TABLET | Freq: Every day | ORAL | Status: DC

## 2013-07-30 NOTE — Telephone Encounter (Signed)
crestor too expensive--?muscle rx to simva yrs ago so will try atorva 40

## 2013-07-30 NOTE — Telephone Encounter (Signed)
Spoke to pt, he is aware and will p/u med today

## 2013-09-05 ENCOUNTER — Ambulatory Visit (INDEPENDENT_AMBULATORY_CARE_PROVIDER_SITE_OTHER): Payer: BC Managed Care – PPO | Admitting: Internal Medicine

## 2013-09-05 VITALS — BP 110/66 | HR 63 | Temp 98.1°F | Resp 16 | Ht 65.5 in | Wt 165.0 lb

## 2013-09-05 DIAGNOSIS — K219 Gastro-esophageal reflux disease without esophagitis: Secondary | ICD-10-CM | POA: Insufficient documentation

## 2013-09-05 DIAGNOSIS — N489 Disorder of penis, unspecified: Secondary | ICD-10-CM

## 2013-09-05 DIAGNOSIS — N4889 Other specified disorders of penis: Secondary | ICD-10-CM

## 2013-09-05 DIAGNOSIS — E785 Hyperlipidemia, unspecified: Secondary | ICD-10-CM

## 2013-09-05 MED ORDER — RANITIDINE HCL 300 MG PO TABS: 300.0000 mg | ORAL_TABLET | Freq: Every day | ORAL | Status: DC

## 2013-09-05 MED ORDER — CLOTRIMAZOLE-BETAMETHASONE 1-0.05 % EX CREA: 1.0000 "application " | TOPICAL_CREAM | Freq: Two times a day (BID) | CUTANEOUS | Status: DC

## 2013-09-05 NOTE — Progress Notes (Signed)
   Subjective:    Patient ID: Alejandro [name], male    DOB: 27-Apr-1960, 53 y.o.   MRN: [id number]  HPI he has 3 questions  Developed reflux symptoms which have been completely controlled by omeprazole 20 mg OTC but now he is concerned about the chronic conditions that may be secondary to her continued use of this medication and would like to change to an H2 blocker--he has not been studied He does eat late in the day  He also was unable to restart Lipitor for control of hyperlipidemia noted at his physical in July because of expense and has restarted simvastatin instead-no problems so far but needs recheck of labs at some point  For the past 4-6 weeks he has noticed a red area around the corona that is neither painful nor itchy that seems to wax and wane and even scale at times Married/1 partner only/no other symptoms/no hx HPV He has psoriasis for yrs on scalp and other areas(see meds-topicals)  Patient Active Problem List   Diagnosis Date Noted  . Other and unspecified hyperlipidemia 09/05/2013  . Sleep apnea syndrome   . Sleep apnea 09/28/2011  . POLYP, COLON 10/01/2007  . HEMORRHOIDS-INTERNAL 10/01/2007  . PERSONAL HX COLON CANCER 10/01/2007     Review of Systems No weight loss No dysuria frequency urgency or penile discharge No abdominal pain constipation diarrhea hematochezia    Objective:   Physical Exam BP 110/66  Pulse 63  Temp(Src) 98.1 F (36.7 C) (Oral)  Resp 16  Ht 5' 5.5" (1.664 m)  Wt 165 lb (74.844 kg)  BMI 27.03 kg/m2  SpO2 97% No acute distress Abdomen benign There is a red lesion with sharply defined margins and minimal scaliness at the margins over the dorsum of the glands Circumcised No other general findings other than a hypopigmented area just proximal to the glans that has been present for many years and was biopsied originally to prove that it was benign       Assessment & Plan:  Penile lesion-differential diagnosis would include psoriasis,  lichen planus, erythroplasia of [place]  Will use topical steroids and if no change in 2-3 weeks refer for biopsy   GERD-mild  Described transition from omeprazole to Zantac  Hyperlipidemia  Future labs 1-3 months to assess effect of simvastatin  Call regarding eventual labs

## 2013-12-16 ENCOUNTER — Telehealth: Payer: Self-pay

## 2013-12-16 DIAGNOSIS — L409 Psoriasis, unspecified: Secondary | ICD-10-CM

## 2013-12-16 MED ORDER — CALCIPOTRIENE 0.005 % EX SOLN: 1.0000 "application " | Freq: Two times a day (BID) | CUTANEOUS | Status: DC

## 2013-12-16 NOTE — Telephone Encounter (Signed)
Calcipotriene 0.005 % solution   Refill Request.   Patient states he does not have any refills remaining on this script.   Please have Dr. Laney Pastor refill    [phone]

## 2014-01-07 ENCOUNTER — Telehealth: Payer: Self-pay

## 2014-01-07 NOTE — Telephone Encounter (Signed)
"  Future labs 1-3 months to assess effect of simvastatin"--From Dr. Ninfa Meeker last note  LMOM that this was done and that he can come back in for his Lipids without having to see a provider.

## 2014-01-07 NOTE — Telephone Encounter (Signed)
Pt called saying a had a visit on 09/05/13 to see Dr. Laney Pastor about his cholesterol level. He would like to come back and only have lab work done. I told him there would have to be orders in place for that to happen. He does not want an office visit.  Please advise at [phone]

## 2014-01-08 ENCOUNTER — Other Ambulatory Visit: Payer: Self-pay | Admitting: Family Medicine

## 2014-01-08 ENCOUNTER — Other Ambulatory Visit (INDEPENDENT_AMBULATORY_CARE_PROVIDER_SITE_OTHER): Payer: BLUE CROSS/BLUE SHIELD

## 2014-01-08 DIAGNOSIS — E785 Hyperlipidemia, unspecified: Secondary | ICD-10-CM

## 2014-02-06 ENCOUNTER — Telehealth: Payer: Self-pay | Admitting: Family Medicine

## 2014-02-06 NOTE — Telephone Encounter (Signed)
Please review Dr. Laney Pastor.

## 2014-02-06 NOTE — Telephone Encounter (Signed)
Patient wants his lab results from 01/08/2014.  [phone]

## 2014-02-06 NOTE — Telephone Encounter (Signed)
isee phone call asking for lab order but no lab draw and no results Did he have blood drawn(here?,elsewhere?)

## 2014-02-10 ENCOUNTER — Other Ambulatory Visit: Payer: Self-pay | Admitting: *Deleted

## 2014-02-10 DIAGNOSIS — E785 Hyperlipidemia, unspecified: Secondary | ICD-10-CM

## 2014-02-10 NOTE — Telephone Encounter (Signed)
Pt will be back in for blood draw at no charge.  Blood was drawn at lab visit but never sent out.

## 2014-02-10 NOTE — Telephone Encounter (Signed)
To Sharyn Lull C to look into this

## 2014-02-16 ENCOUNTER — Other Ambulatory Visit (INDEPENDENT_AMBULATORY_CARE_PROVIDER_SITE_OTHER): Payer: BLUE CROSS/BLUE SHIELD | Admitting: Family Medicine

## 2014-02-16 DIAGNOSIS — E785 Hyperlipidemia, unspecified: Secondary | ICD-10-CM

## 2014-02-18 LAB — LIPID PANEL
CHOL/HDL RATIO: 2.8 ratio
Cholesterol: 144 mg/dL (ref 0–200)
HDL: 52 mg/dL (ref >39)
LDL CALC: 82 mg/dL (ref 0–99)
Triglycerides: 48 mg/dL (ref <150)
VLDL: 10 mg/dL (ref 0–40)

## 2014-02-22 ENCOUNTER — Encounter: Payer: Self-pay | Admitting: *Deleted

## 2014-06-15 ENCOUNTER — Ambulatory Visit (INDEPENDENT_AMBULATORY_CARE_PROVIDER_SITE_OTHER): Payer: BLUE CROSS/BLUE SHIELD | Admitting: Physician Assistant

## 2014-06-15 VITALS — BP 114/68 | HR 98 | Temp 98.8°F | Resp 18 | Ht 66.0 in | Wt 161.0 lb

## 2014-06-15 DIAGNOSIS — B9789 Other viral agents as the cause of diseases classified elsewhere: Principal | ICD-10-CM

## 2014-06-15 DIAGNOSIS — J069 Acute upper respiratory infection, unspecified: Secondary | ICD-10-CM | POA: Diagnosis not present

## 2014-06-15 MED ORDER — LORATADINE-PSEUDOEPHEDRINE ER 5-120 MG PO TB12: 1.0000 | ORAL_TABLET | Freq: Every morning | ORAL | Status: DC

## 2014-06-15 MED ORDER — NAPROXEN 500 MG PO TABS: 500.0000 mg | ORAL_TABLET | Freq: Two times a day (BID) | ORAL | Status: DC

## 2014-06-15 MED ORDER — HYDROCODONE-HOMATROPINE 5-1.5 MG/5ML PO SYRP: 5.0000 mL | ORAL_SOLUTION | Freq: Three times a day (TID) | ORAL | Status: DC | PRN

## 2014-06-15 NOTE — Progress Notes (Signed)
06/15/2014 at 1:58 PM  Alejandro [name] / DOB: 28-Feb-1960 / MRN: [id number]  The patient has POLYP, COLON; HEMORRHOIDS-INTERNAL; PERSONAL HX COLON CANCER; Sleep apnea; Sleep apnea syndrome; Other and unspecified hyperlipidemia; and Gastroesophageal reflux disease without esophagitis on his problem list.  SUBJECTIVE  Chief complaint: Nasal Congestion and Cough  Alejandro [name] is a 54 y.o. male complaining of dry cough and nasal blockage that started 4 days ago.  Associated symptoms include congestion, cough and sore throat today, and he denies fever, difficulty breathing, headache and jaw pain.The patient symptoms are worsening. Treatments tried thus far include Delsym with fair  relief. He denies sick contacts.   He  has a past medical history of High cholesterol; Central sleep apnea; Allergy; Complication of anesthesia; Psoriasis; GERD (gastroesophageal reflux disease); PONV (postoperative nausea and vomiting); Complex sleep apnea syndrome; and Colon cancer.    Medications reviewed and updated by myself where necessary, and exist elsewhere in the encounter.   Mr. Tierce is allergic to cortisone; depo-medrol; codeine; and other. He  reports that he has never smoked. He has never used smokeless tobacco. He reports that he drinks alcohol. He reports that he does not use illicit drugs. He  reports that he currently engages in sexual activity. The patient  has past surgical history that includes COLONOSCOPY (2009); Knee arthroscopy (Left, 1979); Wrist fracture surgery (Left, ~ 1999); Knee arthroscopy (Right, 08/22/2012); and Knee arthroscopy (Right, 08/22/2012).  His family history includes Cancer in his paternal grandmother; Diabetes in his maternal grandmother; Heart disease in his maternal grandfather and paternal grandfather; Hyperlipidemia in his father and maternal grandfather; Sleep disorder in his father and paternal grandfather; Stroke in his maternal grandfather.  Review of Systems    Constitutional: Negative for fever and chills.  Respiratory: Negative for cough.   Cardiovascular: Negative for chest pain.  Gastrointestinal: Negative for heartburn, nausea and vomiting.  Genitourinary: Negative.   Musculoskeletal: Negative.   Skin: Negative for itching and rash.  Neurological: Negative for dizziness and headaches.    OBJECTIVE  His  height is 5\' 6"  (1.676 m) and weight is 161 lb (73.029 kg). His oral temperature is 98.8 F (37.1 C). His blood pressure is 114/68 and his pulse is 98. His respiration is 18 and oxygen saturation is 99%.  The patient's body mass index is 26 kg/(m^2).  Physical Exam  Vitals reviewed. Constitutional: He is oriented to person, place, and time. He appears well-developed and well-nourished. No distress.  Eyes: EOM are normal.  Cardiovascular: Normal rate and regular rhythm.   Respiratory: Effort normal and breath sounds normal. He has no wheezes. He has no rales.  GI: Soft.  Musculoskeletal: Normal range of motion.  Neurological: He is alert and oriented to person, place, and time. No cranial nerve deficit.  Skin: Skin is warm and dry. He is not diaphoretic.  Psychiatric: He has a normal mood and affect.    No results found for this or any previous visit (from the past 24 hour(s)).  ASSESSMENT & PLAN  Chistian was seen today for nasal congestion and cough.  Diagnoses and all orders for this visit:  Viral URI with cough: I could find no indication on exam for antibiotic therapy. Patient to try symptomatic therapy first.  He is to call back in 4 days (6/18) if his symptoms are not improved.  Orders: -     HYDROcodone-homatropine (HYCODAN) 5-1.5 MG/5ML syrup; Take 5 mLs by mouth every 8 (eight) hours as needed for cough. -  loratadine-pseudoephedrine (CLARITIN-D 12 HOUR) 5-120 MG per tablet; Take 1 tablet by mouth every morning. -     naproxen (NAPROSYN) 500 MG tablet; Take 1 tablet (500 mg total) by mouth 2 (two) times daily with a  meal.    The patient was advised to call or come back to clinic if he does not see an improvement in symptoms, or worsens with the above plan.   Philis Fendt, MHS, PA-C [facility] 06/15/2014 1:58 PM

## 2014-08-18 ENCOUNTER — Other Ambulatory Visit: Payer: Self-pay | Admitting: Internal Medicine

## 2014-10-05 ENCOUNTER — Encounter: Payer: Self-pay | Admitting: Gastroenterology

## 2014-11-18 ENCOUNTER — Other Ambulatory Visit: Payer: Self-pay | Admitting: Internal Medicine

## 2014-11-30 ENCOUNTER — Encounter: Payer: Self-pay | Admitting: Internal Medicine

## 2014-12-04 ENCOUNTER — Encounter: Payer: Self-pay | Admitting: Family Medicine

## 2014-12-04 ENCOUNTER — Ambulatory Visit (INDEPENDENT_AMBULATORY_CARE_PROVIDER_SITE_OTHER): Payer: BLUE CROSS/BLUE SHIELD | Admitting: Family Medicine

## 2014-12-04 VITALS — BP 124/84 | HR 61 | Ht 66.0 in | Wt 160.0 lb

## 2014-12-04 DIAGNOSIS — S76312A Strain of muscle, fascia and tendon of the posterior muscle group at thigh level, left thigh, initial encounter: Secondary | ICD-10-CM

## 2014-12-04 NOTE — Patient Instructions (Signed)
You have a hamstring strain. Wear compression sleeve when up and walking around for next 4 weeks, up to 6 weeks with sports. Tylenol or aleve as needed for pain. Icing 15 minutes at a time up to every hour for first 3 days then switch to heat 15 minutes at a time 3-4 times a day. Leg curls, hamstring swings, running lunges - add 2 pound weight with time if these are too easy. 3 sets of 10 once or twice a day. Consider physical therapy as well. Follow up with me when you return from your ski trip or as needed.

## 2014-12-08 ENCOUNTER — Other Ambulatory Visit: Payer: Self-pay | Admitting: Physician Assistant

## 2014-12-08 DIAGNOSIS — S76312A Strain of muscle, fascia and tendon of the posterior muscle group at thigh level, left thigh, initial encounter: Secondary | ICD-10-CM | POA: Insufficient documentation

## 2014-12-08 NOTE — Assessment & Plan Note (Signed)
Compression sleeve, tylenol/aleve if needed.  Icing as well.  Shown home exercises to do daily.  Consider physical therapy.  F/u in 6 weeks for reevaluation.

## 2014-12-08 NOTE — Progress Notes (Signed)
PCP: DOOLITTLE, Linton Ham, MD  Subjective:   HPI: Patient is a 54 y.o. male here for left hamstring injury.  Patient reports on 11/30 he was moving quickly to hit a drop shot and felt a tightness, jelly feeling in left hamstring. Stopped playing as a result. Pain currently 0/10 but still feels tight posteriorly. No swelling or bruising. Has a ski trip in January coming up. Using ice and taking naproxen. History of right hamstring strain but not left. No skin changes, fever, other complaints.  Past Medical History  Diagnosis Date  . High cholesterol   . Central sleep apnea     AHI of 44 exacerbated to 52 and failed CPAP  . Allergy   . Complication of anesthesia     Patient has hard time waking up  . Psoriasis   . GERD (gastroesophageal reflux disease)   . PONV (postoperative nausea and vomiting)   . Complex sleep apnea syndrome     baseline AHI of 44 on 04-21-11, changed to adapt SV    . Colon cancer ([place])     polpy removed from colon that was cancerous    Current Outpatient Prescriptions on File Prior to Visit  Medication Sig Dispense Refill  . atorvastatin (LIPITOR) 40 MG tablet TAKE ONE TABLET BY MOUTH ONCE DAILY   "NO MORE REFILLS WITHOUT OFFICE VISIT" 30 tablet 0  . Calcipotriene 0.005 % solution Apply 1 application topically 2 (two) times daily. 60 mL 11  . clotrimazole-betamethasone (LOTRISONE) cream Apply 1 application topically 2 (two) times daily. 30 g 0  . fluticasone (CUTIVATE) 0.05 % cream Apply topically 2 (two) times daily. 30 g 5  . HYDROcodone-homatropine (HYCODAN) 5-1.5 MG/5ML syrup Take 5 mLs by mouth every 8 (eight) hours as needed for cough. 120 mL 0  . loratadine-pseudoephedrine (CLARITIN-D 12 HOUR) 5-120 MG per tablet Take 1 tablet by mouth every morning. 14 tablet 0  . naproxen (NAPROSYN) 500 MG tablet Take 1 tablet (500 mg total) by mouth 2 (two) times daily with a meal. 16 tablet 0  . omeprazole (PRILOSEC) 20 MG capsule Take 20 mg by mouth daily.    .  simvastatin (ZOCOR) 40 MG tablet Take 40 mg by mouth daily.     No current facility-administered medications on file prior to visit.    Past Surgical History  Procedure Laterality Date  . Colonoscopy  2009    CANCEROUS POLYPS REMOVED  . Knee arthroscopy Left 1979  . Wrist fracture surgery Left ~ 1999  . Knee arthroscopy Right 08/22/2012  . Knee arthroscopy Right 08/22/2012    Procedure: RIGHT KNEE ARTHROSCOPY WITH DEBRIDEMENT ;  Surgeon: Marin Shutter, MD;  Location: [facility];  Service: Orthopedics;  Laterality: Right;    Allergies  Allergen Reactions  . Cortisone   . Depo-Medrol [Methylprednisolone Acetate] Other (See Comments)    Blurry vision- blisters behind retinas  . Codeine     REACTION: nausea, vomiting  . Other     Does/t likt narcotics and cannot take steroids     Social History   Social History  . Marital Status: Married    Spouse Name: N/A  . Number of Children: 2  . Years of Education: BS   Occupational History  .      [employer] History Main Topics  . Smoking status: Never Smoker   . Smokeless tobacco: Never Used  . Alcohol Use: 0.0 oz/week    0 Standard drinks or equivalent per week  Comment: 08/22/2012 "maybe 1 beer q 2 wks"  . Drug Use: No  . Sexual Activity: Yes   Other Topics Concern  . Not on file   Social History Narrative   Central sleep apnea, severe,  in a patient with no discernible history of EDS or insomnia, but loud snoring and witnessed apneas. ESS today is 9. - as before .    He is 97% compliant with usage greater than 4 hrs.   on  V-PAP - no change  of the machine's setting between 5 and 15 - .      He can sleep on the side.  -he can use Trazodone for sleep. He is compliant - 97%.           Family History  Problem Relation Age of Onset  . Sleep disorder Father   . Hyperlipidemia Father   . Sleep disorder Paternal Grandfather   . Heart disease Paternal Grandfather   . Cancer Paternal  Grandmother     breast  . Diabetes Maternal Grandmother   . Heart disease Maternal Grandfather   . Hyperlipidemia Maternal Grandfather   . Stroke Maternal Grandfather     BP 124/84 mmHg  Pulse 61  Ht 5\' 6"  (1.676 m)  Wt 160 lb (72.576 kg)  BMI 25.84 kg/m2  Review of Systems: See HPI above.    Objective:  Physical Exam:  Gen: NAD  Left leg: No gross deformity, swelling, bruising, defect. Mild TTP mid-hamstring.  No proximal or distal tenderness. FROM knee with 5/5 strength at 90 degrees flexion, 4+/5 at 30 degrees. NVI distally.  Right leg: No gross deformity, swelling, bruising, defect. No tenderness of hamstring. FROM knee with 5/5 strength at 90 and 30 degrees though at 30 degrees testing felt like was going to cramp up. NVI distally.    Assessment & Plan:  1. Left hamstring strain - Compression sleeve, tylenol/aleve if needed.  Icing as well.  Shown home exercises to do daily.  Consider physical therapy.  F/u in 6 weeks for reevaluation.

## 2014-12-24 ENCOUNTER — Other Ambulatory Visit: Payer: Self-pay | Admitting: Internal Medicine

## 2014-12-25 NOTE — Telephone Encounter (Signed)
Dr Laney Pastor, it has been since 09/2013 since pt was seen for this med. Was seen for acute issue more recently. He has appt sch in March. Do you want to OK RF until then?

## 2015-03-10 ENCOUNTER — Encounter: Payer: Self-pay | Admitting: Internal Medicine

## 2015-03-10 ENCOUNTER — Ambulatory Visit (INDEPENDENT_AMBULATORY_CARE_PROVIDER_SITE_OTHER): Payer: BLUE CROSS/BLUE SHIELD | Admitting: Internal Medicine

## 2015-03-10 VITALS — BP 112/72 | HR 65 | Temp 97.9°F | Resp 16 | Ht 66.0 in | Wt 161.0 lb

## 2015-03-10 DIAGNOSIS — Z1159 Encounter for screening for other viral diseases: Secondary | ICD-10-CM | POA: Diagnosis not present

## 2015-03-10 DIAGNOSIS — Z0000 Encounter for general adult medical examination without abnormal findings: Secondary | ICD-10-CM | POA: Diagnosis not present

## 2015-03-10 DIAGNOSIS — L409 Psoriasis, unspecified: Secondary | ICD-10-CM

## 2015-03-10 DIAGNOSIS — E785 Hyperlipidemia, unspecified: Secondary | ICD-10-CM | POA: Diagnosis not present

## 2015-03-10 DIAGNOSIS — Z125 Encounter for screening for malignant neoplasm of prostate: Secondary | ICD-10-CM

## 2015-03-10 DIAGNOSIS — K219 Gastro-esophageal reflux disease without esophagitis: Secondary | ICD-10-CM

## 2015-03-10 DIAGNOSIS — G4730 Sleep apnea, unspecified: Secondary | ICD-10-CM

## 2015-03-10 MED ORDER — FLUTICASONE PROPIONATE 0.05 % EX CREA: TOPICAL_CREAM | Freq: Two times a day (BID) | CUTANEOUS | Status: DC

## 2015-03-10 MED ORDER — CALCIPOTRIENE 0.005 % EX SOLN: CUTANEOUS | Status: DC

## 2015-03-10 MED ORDER — ATORVASTATIN CALCIUM 40 MG PO TABS: ORAL_TABLET | ORAL | Status: DC

## 2015-03-10 MED ORDER — CLOTRIMAZOLE-BETAMETHASONE 1-0.05 % EX CREA: 1.0000 "application " | TOPICAL_CREAM | Freq: Two times a day (BID) | CUTANEOUS | Status: DC

## 2015-03-10 NOTE — Patient Instructions (Signed)
[place] :[facility] Dr Crissie Sickles   [place]  Dr Rushie Chestnut   [place]  Dr Parks Ranger  [facility]  Dr Cat Suzi Roots

## 2015-03-11 LAB — COMPREHENSIVE METABOLIC PANEL
ALT: 19 U/L (ref 9–46)
AST: 15 U/L (ref 10–35)
Albumin: 4.4 g/dL (ref 3.6–5.1)
Alkaline Phosphatase: 118 U/L — ABNORMAL HIGH (ref 40–115)
BILIRUBIN TOTAL: 0.5 mg/dL (ref 0.2–1.2)
BUN: 16 mg/dL (ref 7–25)
CO2: 26 mmol/L (ref 20–31)
CREATININE: 0.77 mg/dL (ref 0.70–1.33)
Calcium: 9.1 mg/dL (ref 8.6–10.3)
Chloride: 108 mmol/L (ref 98–110)
GLUCOSE: 94 mg/dL (ref 65–99)
Potassium: 4.2 mmol/L (ref 3.5–5.3)
SODIUM: 141 mmol/L (ref 135–146)
Total Protein: 7 g/dL (ref 6.1–8.1)

## 2015-03-11 LAB — LIPID PANEL
Cholesterol: 146 mg/dL (ref 125–200)
HDL: 53 mg/dL (ref >=40)
LDL CALC: 80 mg/dL (ref <130)
Total CHOL/HDL Ratio: 2.8 Ratio (ref <=5.0)
Triglycerides: 66 mg/dL (ref <150)
VLDL: 13 mg/dL (ref <30)

## 2015-03-11 LAB — CBC WITH DIFFERENTIAL/PLATELET
BASOS PCT: 0 % (ref 0–1)
Basophils Absolute: 0 10*3/uL (ref 0.0–0.1)
EOS PCT: 3 % (ref 0–5)
Eosinophils Absolute: 0.2 10*3/uL (ref 0.0–0.7)
HCT: 43.4 % (ref 39.0–52.0)
Hemoglobin: 14.4 g/dL (ref 13.0–17.0)
Lymphocytes Relative: 14 % (ref 12–46)
Lymphs Abs: 0.9 10*3/uL (ref 0.7–4.0)
MCH: 28.1 pg (ref 26.0–34.0)
MCHC: 33.2 g/dL (ref 30.0–36.0)
MCV: 84.8 fL (ref 78.0–100.0)
MONO ABS: 0.5 10*3/uL (ref 0.1–1.0)
MPV: 10.6 fL (ref 8.6–12.4)
Monocytes Relative: 8 % (ref 3–12)
NEUTROS ABS: 4.6 10*3/uL (ref 1.7–7.7)
NEUTROS PCT: 75 % (ref 43–77)
PLATELETS: 234 10*3/uL (ref 150–400)
RBC: 5.12 MIL/uL (ref 4.22–5.81)
RDW: 13.9 % (ref 11.5–15.5)
WBC: 6.1 10*3/uL (ref 4.0–10.5)

## 2015-03-11 LAB — HEPATITIS C ANTIBODY: HCV AB: NEGATIVE

## 2015-03-11 LAB — PSA: PSA: 0.76 ng/mL (ref <=4.00)

## 2015-03-12 NOTE — Progress Notes (Signed)
Subjective:    Patient ID: Alejandro [name], male    DOB: 1960-10-25, 55 y.o.   MRN: [id number]  HPI  Chief Complaint  Patient presents with  . Annual Exam   Patient Active Problem List   Diagnosis Date Noted  . Hyperlipidemia 09/05/2013  . Gastroesophageal reflux disease without esophagitis 09/05/2013  . Sleep apnea syndrome---can tolerate mask but getting by without daytime dysfunction   . PERSONAL HX COLON CANCER---see careeverywhere--he is followed at [facility] 10/01/2007   Doing well Owns own business Restarting exercise regimen HM stable   Current outpatient prescriptions:  .  atorvastatin (LIPITOR) 40 MG tablet, TAKE ONE TABLET BY MOUTH ONCE DAILY., Disp: 90 tablet, Rfl: 3 .  clotrimazole-betamethasone (LOTRISONE) cream, Apply 1 application topically 2 (two) times daily., Disp: 30 g, Rfl: 2 .  fluticasone (CUTIVATE) 0.05 % cream, Apply topically 2 (two) times daily., Disp: 30 g, Rfl: 5 .  omeprazole (PRILOSEC) 20 MG capsule, Take 20 mg by mouth daily., Disp: , Rfl:  .  Calcipotriene 0.005 % solution, Apply topically 2 times daily., Disp: 60 mL, Rfl: 11    Review of Systems 14pt ROS otherwise negative    Objective:   Physical Exam  Constitutional: He is oriented to person, place, and time. He appears well-developed and well-nourished.  HENT:  Head: Normocephalic and atraumatic.  Right Ear: Hearing, tympanic membrane, external ear and ear canal normal.  Left Ear: Hearing, tympanic membrane, external ear and ear canal normal.  Nose: Nose normal.  Mouth/Throat: Uvula is midline, oropharynx is clear and moist and mucous membranes are normal.  Eyes: Conjunctivae, EOM and lids are normal. Pupils are equal, round, and reactive to light. Right eye exhibits no discharge. Left eye exhibits no discharge. No scleral icterus.  Neck: Trachea normal and normal range of motion. Neck supple. Carotid bruit is not present.  Cardiovascular: Normal rate, regular rhythm, normal heart sounds,  intact distal pulses and normal pulses.   No murmur heard. Pulmonary/Chest: Effort normal and breath sounds normal. No respiratory distress. He has no wheezes. He has no rhonchi. He has no rales.  Abdominal: Soft. Normal appearance and bowel sounds are normal. He exhibits no abdominal bruit. There is no tenderness.  Musculoskeletal: Normal range of motion. He exhibits no edema or tenderness.  Lymphadenopathy:       Head (right side): No submental, no submandibular, no tonsillar, no preauricular, no posterior auricular and no occipital adenopathy present.       Head (left side): No submental, no submandibular, no tonsillar, no preauricular, no posterior auricular and no occipital adenopathy present.    He has no cervical adenopathy.  Neurological: He is alert and oriented to person, place, and time. He has normal strength and normal reflexes. No cranial nerve deficit or sensory deficit. Coordination and gait normal.  Skin: Skin is warm, dry and intact. No lesion and no rash noted.  Psychiatric: He has a normal mood and affect. His speech is normal and behavior is normal. Judgment and thought content normal.  BP 112/72 mmHg  Pulse 65  Temp(Src) 97.9 F (36.6 C)  Resp 16  Ht 5' 6"  (1.676 m)  Wt 161 lb (73.029 kg)  BMI 26.00 kg/m2         Assessment & Plan:  Annual physical exam  Psoriasis - Plan: fluticasone (CUTIVATE) 0.05 % cream  Sleep apnea  Hyperlipidemia - Plan: Lipid panel  Gastroesophageal reflux disease without esophagitis - Plan: CBC with Differential/Platelet, Comprehensive metabolic panel  Screening for prostate  cancer - Plan: PSA  Need for hepatitis C screening test - Plan: Hepatitis C antibody  Meds ordered this encounter  Medications  . atorvastatin (LIPITOR) 40 MG tablet    Sig: TAKE ONE TABLET BY MOUTH ONCE DAILY.    Dispense:  90 tablet    Refill:  3  . clotrimazole-betamethasone (LOTRISONE) cream    Sig: Apply 1 application topically 2 (two) times  daily.    Dispense:  30 g    Refill:  2  . fluticasone (CUTIVATE) 0.05 % cream    Sig: Apply topically 2 (two) times daily.    Dispense:  30 g    Refill:  5  . Calcipotriene 0.005 % solution    Sig: Apply topically 2 times daily.    Dispense:  60 mL    Refill:  11   Addend labs 3/10 Results for orders placed or performed in visit on 03/10/15  CBC with Differential/Platelet  Result Value Ref Range   WBC 6.1 4.0 - 10.5 K/uL   RBC 5.12 4.22 - 5.81 MIL/uL   Hemoglobin 14.4 13.0 - 17.0 g/dL   HCT 43.4 39.0 - 52.0 %   MCV 84.8 78.0 - 100.0 fL   MCH 28.1 26.0 - 34.0 pg   MCHC 33.2 30.0 - 36.0 g/dL   RDW 13.9 11.5 - 15.5 %   Platelets 234 150 - 400 K/uL   MPV 10.6 8.6 - 12.4 fL   Neutrophils Relative % 75 43 - 77 %   Neutro Abs 4.6 1.7 - 7.7 K/uL   Lymphocytes Relative 14 12 - 46 %   Lymphs Abs 0.9 0.7 - 4.0 K/uL   Monocytes Relative 8 3 - 12 %   Monocytes Absolute 0.5 0.1 - 1.0 K/uL   Eosinophils Relative 3 0 - 5 %   Eosinophils Absolute 0.2 0.0 - 0.7 K/uL   Basophils Relative 0 0 - 1 %   Basophils Absolute 0.0 0.0 - 0.1 K/uL   Smear Review Criteria for review not met   Comprehensive metabolic panel  Result Value Ref Range   Sodium 141 135 - 146 mmol/L   Potassium 4.2 3.5 - 5.3 mmol/L   Chloride 108 98 - 110 mmol/L   CO2 26 20 - 31 mmol/L   Glucose, Bld 94 65 - 99 mg/dL   BUN 16 7 - 25 mg/dL   Creat 0.77 0.70 - 1.33 mg/dL   Total Bilirubin 0.5 0.2 - 1.2 mg/dL   Alkaline Phosphatase 118 (H) 40 - 115 U/L   AST 15 10 - 35 U/L   ALT 19 9 - 46 U/L   Total Protein 7.0 6.1 - 8.1 g/dL   Albumin 4.4 3.6 - 5.1 g/dL   Calcium 9.1 8.6 - 10.3 mg/dL  Lipid panel  Result Value Ref Range   Cholesterol 146 125 - 200 mg/dL   Triglycerides 66 <150 mg/dL   HDL 53 >=40 mg/dL   Total CHOL/HDL Ratio 2.8 <=5.0 Ratio   VLDL 13 <30 mg/dL   LDL Cholesterol 80 <130 mg/dL  PSA  Result Value Ref Range   PSA 0.76 <=4.00 ng/mL  Hepatitis C antibody  Result Value Ref Range   HCV Ab NEGATIVE  NEGATIVE

## 2015-03-17 ENCOUNTER — Encounter: Payer: Self-pay | Admitting: Internal Medicine

## 2015-04-04 LAB — HM COLONOSCOPY

## 2015-04-21 ENCOUNTER — Encounter: Payer: Self-pay | Admitting: Family Medicine

## 2015-08-18 ENCOUNTER — Ambulatory Visit (INDEPENDENT_AMBULATORY_CARE_PROVIDER_SITE_OTHER): Payer: BLUE CROSS/BLUE SHIELD | Admitting: Family Medicine

## 2015-08-18 ENCOUNTER — Encounter (INDEPENDENT_AMBULATORY_CARE_PROVIDER_SITE_OTHER): Payer: Self-pay

## 2015-08-18 ENCOUNTER — Encounter: Payer: Self-pay | Admitting: Family Medicine

## 2015-08-18 ENCOUNTER — Ambulatory Visit (HOSPITAL_BASED_OUTPATIENT_CLINIC_OR_DEPARTMENT_OTHER)
Admission: RE | Admit: 2015-08-18 | Discharge: 2015-08-18 | Disposition: A | Discharge status: Home / Self Care | Payer: BLUE CROSS/BLUE SHIELD | Source: Ambulatory Visit | Attending: Family Medicine | Admitting: Family Medicine

## 2015-08-18 VITALS — BP 141/82 | HR 61 | Ht 66.0 in | Wt 150.0 lb

## 2015-08-18 DIAGNOSIS — S59901A Unspecified injury of right elbow, initial encounter: Secondary | ICD-10-CM | POA: Diagnosis not present

## 2015-08-18 DIAGNOSIS — M25521 Pain in right elbow: Secondary | ICD-10-CM | POA: Insufficient documentation

## 2015-08-18 MED ORDER — MELOXICAM 15 MG PO TABS: 15.0000 mg | ORAL_TABLET | Freq: Every day | ORAL | 2 refills | Status: DC

## 2015-08-18 NOTE — Patient Instructions (Signed)
You have a synovitis of your elbow. Take meloxicam 15mg  daily with food for pain and inflammation. Icing 15 minutes at a time at least 3-4 times a day. Consider compression sleeve or ACE wrap to help with support and to push the fluid out. After a few days start the elbow hang exercise I showed you to regain the extension. I would expect this to take 2-4 weeks to resolve. I think it's ok to go on the golf trip as this is not a dangerous condition. Consider MRI of your elbow if not improving over the next 4-6 weeks.

## 2015-08-19 ENCOUNTER — Telehealth: Payer: Self-pay | Admitting: Family Medicine

## 2015-08-19 DIAGNOSIS — M25521 Pain in right elbow: Secondary | ICD-10-CM | POA: Insufficient documentation

## 2015-08-19 HISTORY — DX: Pain in right elbow: M25.521

## 2015-08-19 NOTE — Assessment & Plan Note (Signed)
exam, ultrasound reassuring.  Independently reviewed radiographs and no evidence fracture, loose body.  Appears to have an effusion on lateral.  History, exam consistent with synovitis of the joint.  We discussed options - he had an unusual reaction to a cortisone shot in past (changes to retina, ? Increased pressure) so cannot do this or oral prednisone.  Start with meloxicam, icing, compression.  Shown exercise to help regain extension.  Consider MRI if not improving over next 4-6 weeks.

## 2015-08-19 NOTE — Progress Notes (Signed)
PCP: DOOLITTLE, Linton Ham, MD  Subjective:   HPI: Patient is a 55 y.o. male here for right elbow pain.  Patient denies known acute injury. He played a lot of tennis on 8/9 - felt weird with one particular serve but able to keep playing without pain. Felt normal the next day then played golf on 8/11. About 1 hour after playing golf his right elbow was swollen, painful. Has been icing, taking ibuprofen. Tried some of wife's voltaren. Right handed. No skin changes, numbness. Pain level 0/10 at rest, up to 10/10 and sharp with movement.  Past Medical History:  Diagnosis Date  . Allergy   . Central sleep apnea    AHI of 44 exacerbated to 52 and failed CPAP  . Colon cancer ([place])    polpy removed from colon that was cancerous  . Complex sleep apnea syndrome    baseline AHI of 44 on 04-21-11, changed to adapt SV    . Complication of anesthesia    Patient has hard time waking up  . GERD (gastroesophageal reflux disease)   . High cholesterol   . PONV (postoperative nausea and vomiting)   . Psoriasis     Current Outpatient Prescriptions on File Prior to Visit  Medication Sig Dispense Refill  . atorvastatin (LIPITOR) 40 MG tablet TAKE ONE TABLET BY MOUTH ONCE DAILY. 90 tablet 3  . Calcipotriene 0.005 % solution Apply topically 2 times daily. 60 mL 11  . clotrimazole-betamethasone (LOTRISONE) cream Apply 1 application topically 2 (two) times daily. 30 g 2  . fluticasone (CUTIVATE) 0.05 % cream Apply topically 2 (two) times daily. 30 g 5  . omeprazole (PRILOSEC) 20 MG capsule Take 20 mg by mouth daily.     No current facility-administered medications on file prior to visit.     Past Surgical History:  Procedure Laterality Date  . COLONOSCOPY  2009   CANCEROUS POLYPS REMOVED  . KNEE ARTHROSCOPY Left 1979  . KNEE ARTHROSCOPY Right 08/22/2012  . KNEE ARTHROSCOPY Right 08/22/2012   Procedure: RIGHT KNEE ARTHROSCOPY WITH DEBRIDEMENT ;  Surgeon: Marin Shutter, MD;  Location: [place];   Service: Orthopedics;  Laterality: Right;  . WRIST FRACTURE SURGERY Left ~ 1999    Allergies  Allergen Reactions  . Cortisone   . Depo-Medrol [Methylprednisolone Acetate] Other (See Comments)    Blurry vision- blisters behind retinas  . Codeine     REACTION: nausea, vomiting  . Other     Does/t likt narcotics and cannot take steroids     Social History   Social History  . Marital status: Married    Spouse name: N/A  . Number of children: 2  . Years of education: BS   Occupational History  .      [employer] History Main Topics  . Smoking status: Never Smoker  . Smokeless tobacco: Never Used  . Alcohol use 0.0 oz/week     Comment: 08/22/2012 "maybe 1 beer q 2 wks"  . Drug use: No  . Sexual activity: Yes   Other Topics Concern  . Not on file   Social History Narrative   Central sleep apnea, severe,  in a patient with no discernible history of EDS or insomnia, but loud snoring and witnessed apneas. ESS today is 9. - as before .    He is 97% compliant with usage greater than 4 hrs.   on  V-PAP - no change  of the machine's setting between 5 and 15 - .  He can sleep on the side.  -he can use Trazodone for sleep. He is compliant - 97%.           Family History  Problem Relation Age of Onset  . Sleep disorder Father   . Hyperlipidemia Father   . Sleep disorder Paternal Grandfather   . Heart disease Paternal Grandfather   . Cancer Paternal Grandmother     breast  . Diabetes Maternal Grandmother   . Heart disease Maternal Grandfather   . Hyperlipidemia Maternal Grandfather   . Stroke Maternal Grandfather     BP (!) 141/82   Pulse 61   Ht 5\' 6"  (1.676 m)   Wt 150 lb (68 kg)   BMI 24.21 kg/m   Review of Systems: See HPI above.    Objective:  Physical Exam:  Gen: NAD, comfortable in exam room  Right elbow: No gross deformity, swelling, bruising. No focal tenderness about elbow, upper arm, forearm. ROM - lacks 10  degrees extension and flexion. Collateral ligaments intact. Strength 5/5 without pain on testing wrist flexion, extension, supination, pronation, finger flexion and extension, elbow flexion and extension. NVI distally.  MSK u/s right elbow: No abnormalities of medial, lateral epicondyles, extensor and flexor tendons, biceps tendon.     Assessment & Plan:  1. Right elbow pain - exam, ultrasound reassuring.  Independently reviewed radiographs and no evidence fracture, loose body.  Appears to have an effusion on lateral.  History, exam consistent with synovitis of the joint.  We discussed options - he had an unusual reaction to a cortisone shot in past (changes to retina, ? Increased pressure) so cannot do this or oral prednisone.  Start with meloxicam, icing, compression.  Shown exercise to help regain extension.  Consider MRI if not improving over next 4-6 weeks.

## 2015-08-19 NOTE — Telephone Encounter (Signed)
It's usually difficult to drain the elbow unless the swelling in the joint is significant - I don't think there's enough to pull off in his case.  Usually what we do if it's bothersome enough is consider a cortisone injection into the joint to decrease the inflammation and help the body resorb the fluid, helps decrease the secretion of more fluid into the joint.

## 2015-08-19 NOTE — Telephone Encounter (Signed)
Talked to patient about adding some form of compression in conjunction with the anti-inflammatories we prescribed yesterday. Since he can not tolerate steroids due to a reaction, a procedure of draining (not enough fluid present) and steriod injection would not be an option.   Patient agreed.

## 2015-09-21 DIAGNOSIS — Z23 Encounter for immunization: Secondary | ICD-10-CM | POA: Diagnosis not present

## 2015-09-30 ENCOUNTER — Telehealth: Payer: Self-pay | Admitting: Family Medicine

## 2015-09-30 NOTE — Telephone Encounter (Signed)
Spoke to patient and told him he would get one credit for his imaging for one visit and gave him number for imaging department w/further questions/ls

## 2016-02-14 ENCOUNTER — Ambulatory Visit: Payer: BLUE CROSS/BLUE SHIELD | Admitting: Family Medicine

## 2016-02-22 ENCOUNTER — Telehealth: Payer: Self-pay | Admitting: Internal Medicine

## 2016-02-22 ENCOUNTER — Ambulatory Visit (INDEPENDENT_AMBULATORY_CARE_PROVIDER_SITE_OTHER): Payer: BLUE CROSS/BLUE SHIELD | Admitting: Family Medicine

## 2016-02-22 ENCOUNTER — Encounter: Payer: Self-pay | Admitting: Family Medicine

## 2016-02-22 VITALS — BP 104/62 | HR 70 | Temp 97.8°F | Ht 65.5 in | Wt 154.8 lb

## 2016-02-22 DIAGNOSIS — T753XXA Motion sickness, initial encounter: Secondary | ICD-10-CM | POA: Insufficient documentation

## 2016-02-22 DIAGNOSIS — K219 Gastro-esophageal reflux disease without esophagitis: Secondary | ICD-10-CM | POA: Diagnosis not present

## 2016-02-22 DIAGNOSIS — L74513 Primary focal hyperhidrosis, soles: Secondary | ICD-10-CM | POA: Diagnosis not present

## 2016-02-22 DIAGNOSIS — L74512 Primary focal hyperhidrosis, palms: Secondary | ICD-10-CM | POA: Diagnosis not present

## 2016-02-22 DIAGNOSIS — L409 Psoriasis, unspecified: Secondary | ICD-10-CM | POA: Insufficient documentation

## 2016-02-22 DIAGNOSIS — E785 Hyperlipidemia, unspecified: Secondary | ICD-10-CM | POA: Diagnosis not present

## 2016-02-22 DIAGNOSIS — R21 Rash and other nonspecific skin eruption: Secondary | ICD-10-CM | POA: Insufficient documentation

## 2016-02-22 MED ORDER — PROMETHAZINE HCL 12.5 MG PO TABS: 12.5000 mg | ORAL_TABLET | Freq: Four times a day (QID) | ORAL | 1 refills | Status: DC | PRN

## 2016-02-22 MED ORDER — CALCIPOTRIENE 0.005 % EX SOLN: CUTANEOUS | 11 refills | Status: DC

## 2016-02-22 MED ORDER — CLOTRIMAZOLE-BETAMETHASONE 1-0.05 % EX CREA: 1.0000 "application " | TOPICAL_CREAM | Freq: Two times a day (BID) | CUTANEOUS | 2 refills | Status: DC

## 2016-02-22 MED ORDER — ATORVASTATIN CALCIUM 40 MG PO TABS: ORAL_TABLET | ORAL | 3 refills | Status: DC

## 2016-02-22 MED ORDER — FLUTICASONE PROPIONATE 0.05 % EX CREA: TOPICAL_CREAM | Freq: Two times a day (BID) | CUTANEOUS | 5 refills | Status: DC

## 2016-02-22 MED ORDER — ALUMINUM CHLORIDE 20 % EX SOLN: Freq: Every day | CUTANEOUS | 0 refills | Status: DC

## 2016-02-22 NOTE — Assessment & Plan Note (Signed)
Scalp. Calcipotriene helpful. refiled this as well as some creams he uses in groin. Could be psoriasis as well given response to steroid- he did not want to disrobe today for evaluation.

## 2016-02-22 NOTE — Assessment & Plan Note (Signed)
S:Motion sickness has been an issue for him since childhood but seems to be worseing. Often happens after plane ride. Bus can also happen if cannot look out front window.  Cars ok as long as can look out front window. May have depth perception and admits Probably needs glasses- vision always blurry very slightly. Tried contacts last year but did not tolerate and doesn't think he could remember to wear glasses regularly.   After feeling motion sickness- can throw up for 24 hours every 15-20 minutes then dry heaves then resolves within a day. Dramamine helps some- does not always take it and sometimes does not get sick without it.  A/P: discussed using dramamine regularly before trips that can precipitate this. Also for nausea/vomiting portion sent in phenergan which may also help with motion sickness some as well. Discussed with severity of symptoms could consider neurology referral (as asymptomatic today did not do a full neuro exam)

## 2016-02-22 NOTE — Assessment & Plan Note (Addendum)
S:States 6 months of issues. Excessive hand sweating- washcloth in his car, similar in his feet- feet get cold from socks being wet. Has similar sensation in hand of them getting very cold at times but usually not diffuse in body- isolated to hands. Issues seem to come and go. No cp, palpitations, sob with these episodres.  A/P: we opted to try alumininum chloride 20% topical nightly spacing out to once a week if controlled then off if preferred  Doubt pheochromocytoma as sweating is limited to palms, soles, axilla. Also no headache- would be great to check Bp with episode but has had no report of hypertension. Could get urinary and plasma fractionated metanephrine and catecholamines  Also obviously needs tsh at [facility]

## 2016-02-22 NOTE — Patient Instructions (Addendum)
Could trial zantac/ranitidine 150mg  twice a day instead of omeprazole.   Trial hypercare 20% antiperspirant nightly when sweating flares up then can go to 1-2x a week when calms down then stop  Trial phenergan with nausea/vomiting episodes

## 2016-02-22 NOTE — Progress Notes (Addendum)
Phone: [phone]  Subjective:  Patient presents today to establish care.  Prior patient of Dr. Laney Pastor who retired from [place]. Chief complaint-noted.   See problem oriented charting  The following were reviewed and entered/updated in epic: Past Medical History:  Diagnosis Date  . Allergy   . Central sleep apnea    AHI of 44 exacerbated to 52 and failed CPAP  . Colon cancer ([place])    polpy removed from colon that was cancerous  . Complex sleep apnea syndrome    baseline AHI of 44 on 04-21-11, changed to adapt SV    . Complication of anesthesia    Patient has hard time waking up  . GERD (gastroesophageal reflux disease)   . HEMORRHOIDS-INTERNAL 10/01/2007   Qualifier: Diagnosis of  By: Fuller Plan MD Marijo Conception High cholesterol   . PONV (postoperative nausea and vomiting)   . Psoriasis   . Right elbow pain 08/19/2015   Patient Active Problem List   Diagnosis Date Noted  . Psoriasis 02/22/2016    Priority: High  . Severe motion sickness 02/22/2016    Priority: High  . History of colon cancer 10/01/2007    Priority: High  . Groin rash 02/22/2016    Priority: Medium  . Hyperhidrosis of palms and soles 02/22/2016    Priority: Medium  . Hyperlipidemia 09/05/2013    Priority: Medium  . Gastroesophageal reflux disease without esophagitis 09/05/2013    Priority: Medium  . Sleep apnea 09/28/2011    Priority: Medium  . Left hamstring muscle strain 12/08/2014    Priority: Low   Past Surgical History:  Procedure Laterality Date  . COLONOSCOPY  2009   CANCEROUS POLYPS REMOVED  . KNEE ARTHROSCOPY Left 1979  . KNEE ARTHROSCOPY Right 08/22/2012   Procedure: RIGHT KNEE ARTHROSCOPY WITH DEBRIDEMENT ;  Surgeon: Marin Shutter, MD;  Location: [facility];  Service: Orthopedics;  Laterality: Right;  . WRIST FRACTURE SURGERY Left ~ 1995    Family History  Problem Relation Age of Onset  . Sleep disorder Father   . Hyperlipidemia Father   . Sleep disorder Paternal Grandfather   .  Heart disease Paternal Grandfather   . Cancer Paternal Grandmother     breast  . Breast cancer Mother   . Alcohol abuse Mother   . Eating disorder Mother   . Diabetes Maternal Grandmother   . Heart disease Maternal Grandfather     late life  . Hyperlipidemia Maternal Grandfather   . Stroke Maternal Grandfather     late life    Medications- reviewed and updated Current Outpatient Prescriptions  Medication Sig Dispense Refill  . aluminum chloride (HYPERCARE) 20 % external solution Apply topically at bedtime. 35 mL 0  . atorvastatin (LIPITOR) 40 MG tablet TAKE ONE TABLET BY MOUTH ONCE DAILY. 90 tablet 3  . Calcipotriene 0.005 % solution Apply topically 2 times daily. 60 mL 11  . clotrimazole-betamethasone (LOTRISONE) cream Apply 1 application topically 2 (two) times daily. 30 g 2  . fluticasone (CUTIVATE) 0.05 % cream Apply topically 2 (two) times daily. 30 g 5  . omeprazole (PRILOSEC) 20 MG capsule Take 20 mg by mouth daily.    . promethazine (PHENERGAN) 12.5 MG tablet Take 1 tablet (12.5 mg total) by mouth every 6 (six) hours as needed for nausea or vomiting. 30 tablet 1   No current facility-administered medications for this visit.     Allergies-reviewed and updated Allergies  Allergen Reactions  . Cortisone   . Depo-Medrol [  Methylprednisolone Acetate] Other (See Comments)    Blurry vision- blisters behind retinas  . Codeine     REACTION: nausea, vomiting  . Other     Does/t likt narcotics and cannot take steroids     Social History   Social History  . Marital status: Married    Spouse name: N/A  . Number of children: 2  . Years of education: BS   Occupational History  .      [employer] History Main Topics  . Smoking status: Never Smoker  . Smokeless tobacco: Never Used  . Alcohol use 0.0 oz/week     Comment: 08/22/2012 "maybe 1 beer q 2 wks"  . Drug use: No  . Sexual activity: Yes   Other Topics Concern  . None   Social  History Narrative   Married. 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 Negative for fever, malaise/fatigue and weight loss.  HENT: Negative for hearing loss and tinnitus.   Eyes: Positive for blurred vision. Negative for double vision and photophobia.  Respiratory: Negative for cough and shortness of breath.   Cardiovascular: Negative for chest pain and palpitations.  Gastrointestinal: Positive for heartburn, nausea and vomiting. Negative for abdominal pain and diarrhea.  Genitourinary: Negative for dysuria and urgency.  Musculoskeletal: Negative for myalgias and neck pain.  Skin: Negative for rash.  Neurological: Positive for dizziness (with motion sickness along with nausea/vomiting). Negative for tingling, sensory change, speech change and headaches.  Endo/Heme/Allergies: Negative for polydipsia. Does not bruise/bleed easily.  Psychiatric/Behavioral: Negative for hallucinations and substance abuse.   Objective: BP 104/62 (BP Location: Left Arm, Patient Position: Sitting, Cuff Size: Large)   Pulse 70   Temp 97.8 F (36.6 C) (Oral)   Ht 5' 5.5" (1.664 m)   Wt 154 lb 12.8 oz (70.2 kg)   SpO2 95%   BMI 25.37 kg/m  Gen: NAD, resting comfortably HEENT: Mucous membranes are moist. Oropharynx normal. TM normal. Eyes: sclera and lids normal, PERRLA Neck: no thyromegaly, no cervical lymphadenopathy CV: RRR no murmurs rubs or gallops Lungs: CTAB no crackles, wheeze, rhonchi Abdomen: soft/nontender/nondistended/normal bowel sounds. No rebound or guarding.  Ext: no edema, palms and soles not sweaty  today Skin: warm, dry Neuro: 5/5 strength in upper and lower extremities, normal gait, normal reflexes  Left Shoulder: Inspection reveals no abnormalities, atrophy or asymmetry. Palpation is normal with no tenderness over AC joint or bicipital groove. ROM is full in all planes. Rotator cuff strength normal throughout. But does have signs of impingement with positive Hawkin's tests, empty can.neer normal though No painful arc and no drop arm sign.  Assessment/Plan:  Severe motion sickness S:Motion sickness has been an issue for him since childhood but seems to be worseing. Often happens after plane ride. Bus can also happen if cannot look out front window.  Cars ok as long as can look out front window. May have depth perception and admits Probably needs glasses- vision always blurry very slightly. Tried contacts last year but did not tolerate and doesn't think he could remember to wear glasses  regularly.   After feeling motion sickness- can throw up for 24 hours every 15-20 minutes then dry heaves then resolves within a day. Dramamine helps some- does not always take it and sometimes does not get sick without it.  A/P: discussed using dramamine regularly before trips that can precipitate this. Also for nausea/vomiting portion sent in phenergan which may also help with motion sickness some as well. Discussed with severity of symptoms could consider neurology referral (as asymptomatic today did not do a full neuro exam)  Gastroesophageal reflux disease without esophagitis S:After one episode of the motion sickness he recovered completely except for persistent - burping/belching- went on omeprazole and resolved issue. Returns if misses dose . We discussed remaining on omeprazole 20mg  otc an option.  A/P:He is worried about long term side effects of PPI and may opt for zantac after discussion.    Hyperhidrosis of palms and soles S:States 6 months of issues. Excessive hand sweating- washcloth in  his car, similar in his feet- feet get cold from socks being wet. Has similar sensation in hand of them getting very cold at times but usually not diffuse in body- isolated to hands. Issues seem to come and go. No cp, palpitations, sob with these episodres.  A/P: we opted to try alumininum chloride 20% topical nightly spacing out to once a week if controlled then off if preferred  Doubt pheochromocytoma as sweating is limited to palms, soles, axilla. Also no headache- would be great to check Bp with episode but has had no report of hypertension. Could get urinary and plasma fractionated metanephrine and catecholamines  Also obviously needs tsh at cpe  Hyperlipidemia S: well controlled on atorvastatin 40mg  on last check with LDL under 100 at 80. No myalgias.  Lab Results  Component Value Date   CHOL 146 03/10/2015   HDL 53 03/10/2015   LDLCALC 80 03/10/2015   TRIG 66 03/10/2015   CHOLHDL 2.8 03/10/2015   A/P: refilled medication- doing well   Psoriasis Scalp. Calcipotriene helpful. refiled this as well as some creams he uses in groin. Could be psoriasis as well given response to steroid- he did not want to disrobe today for evaluation.   Left shoulder pain S: hard to play golf due to pain in left shoulder with lifting club overhead. He desires to be active nad lmiting him. Pain overhead and with arm behind back. Moderate pain even if he tries to stretchshoulder A/P: suspect reotator cuff strain- will use home exercises from sports med advisor for next 4 weeks 3x a week as long as pain <1-2/10. Will consider follow up with [facility] if nor improvign. Encouraged him to take it easy sports wise on that side.   CPE in next few months  Meds ordered this encounter  Medications  . atorvastatin (LIPITOR) 40 MG tablet    Sig: TAKE ONE TABLET BY MOUTH ONCE DAILY.    Dispense:  90 tablet    Refill:  3  . Calcipotriene 0.005 % solution    Sig: Apply topically 2 times daily.    Dispense:  60 mL     Refill:  11  . fluticasone (CUTIVATE) 0.05 % cream    Sig: Apply topically 2 (two) times daily.    Dispense:  30 g    Refill:  5  . clotrimazole-betamethasone (LOTRISONE) cream    Sig: Apply 1 application topically 2 (two) times daily.    Dispense:  30 g    Refill:  2  . aluminum  chloride (HYPERCARE) 20 % external solution    Sig: Apply topically at bedtime.    Dispense:  35 mL    Refill:  0  . promethazine (PHENERGAN) 12.5 MG tablet    Sig: Take 1 tablet (12.5 mg total) by mouth every 6 (six) hours as needed for nausea or vomiting.    Dispense:  30 tablet    Refill:  1   The duration of face-to-face time during this visit was greater than 55 minutes (11:05 AM to at least noon). Greater than 50% of this time was spent in counseling, explanation of diagnosis, planning of further management, and/or coordination of care including discussion of PPI risks, discussion of difficulty of motion sickness, discussion on hyperhidrosis and treatment options.explanation of insurance only covering cpe once per year  Return precautions advised.  Garret Reddish, MD

## 2016-02-22 NOTE — Assessment & Plan Note (Signed)
S: well controlled on atorvastatin 40mg  on last check with LDL under 100 at 80. No myalgias.  Lab Results  Component Value Date   CHOL 146 03/10/2015   HDL 53 03/10/2015   LDLCALC 80 03/10/2015   TRIG 66 03/10/2015   CHOLHDL 2.8 03/10/2015   A/P: refilled medication- doing well

## 2016-02-22 NOTE — Progress Notes (Signed)
Pre visit review using our clinic review tool, if applicable. No additional management support is needed unless otherwise documented below in the visit note. 

## 2016-02-22 NOTE — Assessment & Plan Note (Signed)
S:After one episode of the motion sickness he recovered completely except for persistent - burping/belching- went on omeprazole and resolved issue. Returns if misses dose . We discussed remaining on omeprazole 20mg  otc an option.  A/P:He is worried about long term side effects of PPI and may opt for zantac after discussion.

## 2016-02-22 NOTE — Telephone Encounter (Signed)
Pt would like to see if you could reprint the exercise information that was given to him in the office today for his shoulder.

## 2016-02-22 NOTE — Telephone Encounter (Signed)
Called patient and left a voicemail message to let patient know that I placed a copy at the front desk for him to pick up at his convenience

## 2016-03-21 ENCOUNTER — Other Ambulatory Visit (INDEPENDENT_AMBULATORY_CARE_PROVIDER_SITE_OTHER): Payer: BLUE CROSS/BLUE SHIELD

## 2016-03-21 DIAGNOSIS — Z0000 Encounter for general adult medical examination without abnormal findings: Secondary | ICD-10-CM | POA: Diagnosis not present

## 2016-03-21 LAB — CBC WITH DIFFERENTIAL/PLATELET
BASOS PCT: 0.7 % (ref 0.0–3.0)
Basophils Absolute: 0 10*3/uL (ref 0.0–0.1)
EOS ABS: 0.1 10*3/uL (ref 0.0–0.7)
Eosinophils Relative: 2.7 % (ref 0.0–5.0)
HCT: 44 % (ref 39.0–52.0)
Hemoglobin: 15 g/dL (ref 13.0–17.0)
LYMPHS ABS: 0.8 10*3/uL (ref 0.7–4.0)
Lymphocytes Relative: 16.6 % (ref 12.0–46.0)
MCHC: 34.2 g/dL (ref 30.0–36.0)
MCV: 84.3 fl (ref 78.0–100.0)
MONO ABS: 0.4 10*3/uL (ref 0.1–1.0)
Monocytes Relative: 8 % (ref 3.0–12.0)
NEUTROS PCT: 72 % (ref 43.0–77.0)
Neutro Abs: 3.6 10*3/uL (ref 1.4–7.7)
PLATELETS: 235 10*3/uL (ref 150.0–400.0)
RBC: 5.21 Mil/uL (ref 4.22–5.81)
RDW: 13.5 % (ref 11.5–15.5)
WBC: 5 10*3/uL (ref 4.0–10.5)

## 2016-03-21 LAB — BASIC METABOLIC PANEL
BUN: 16 mg/dL (ref 6–23)
CO2: 28 mEq/L (ref 19–32)
CREATININE: 0.77 mg/dL (ref 0.40–1.50)
Calcium: 9.6 mg/dL (ref 8.4–10.5)
Chloride: 105 mEq/L (ref 96–112)
GFR: 111.89 mL/min (ref >60.00)
GLUCOSE: 109 mg/dL — AB (ref 70–99)
Potassium: 4.3 mEq/L (ref 3.5–5.1)
Sodium: 139 mEq/L (ref 135–145)

## 2016-03-21 LAB — LIPID PANEL
CHOL/HDL RATIO: 3
Cholesterol: 151 mg/dL (ref 0–200)
HDL: 56.6 mg/dL (ref >39.00)
LDL CALC: 82 mg/dL (ref 0–99)
NONHDL: 94.05
Triglycerides: 58 mg/dL (ref 0.0–149.0)
VLDL: 11.6 mg/dL (ref 0.0–40.0)

## 2016-03-21 LAB — HEPATIC FUNCTION PANEL
ALT: 25 U/L (ref 0–53)
AST: 19 U/L (ref 0–37)
Albumin: 4.5 g/dL (ref 3.5–5.2)
Alkaline Phosphatase: 117 U/L (ref 39–117)
BILIRUBIN DIRECT: 0.1 mg/dL (ref 0.0–0.3)
Total Bilirubin: 0.6 mg/dL (ref 0.2–1.2)
Total Protein: 6.7 g/dL (ref 6.0–8.3)

## 2016-03-21 LAB — TSH: TSH: 1.53 u[IU]/mL (ref 0.35–4.50)

## 2016-03-21 LAB — POC URINALSYSI DIPSTICK (AUTOMATED)
Bilirubin, UA: NEGATIVE
Blood, UA: NEGATIVE
GLUCOSE UA: NEGATIVE
KETONES UA: NEGATIVE
Leukocytes, UA: NEGATIVE
Nitrite, UA: NEGATIVE
PROTEIN UA: NEGATIVE
SPEC GRAV UA: 1.015 (ref 1.030–1.035)
Urobilinogen, UA: 0.2 (ref ?–2.0)
pH, UA: 7.5 (ref 5.0–8.0)

## 2016-03-21 LAB — PSA: PSA: 0.67 ng/mL (ref 0.10–4.00)

## 2016-03-28 ENCOUNTER — Encounter: Payer: Self-pay | Admitting: Family Medicine

## 2016-03-28 ENCOUNTER — Ambulatory Visit (INDEPENDENT_AMBULATORY_CARE_PROVIDER_SITE_OTHER): Payer: BLUE CROSS/BLUE SHIELD | Admitting: Family Medicine

## 2016-03-28 VITALS — BP 128/70 | HR 71 | Temp 98.0°F | Ht 65.75 in | Wt 155.4 lb

## 2016-03-28 DIAGNOSIS — Z0000 Encounter for general adult medical examination without abnormal findings: Secondary | ICD-10-CM | POA: Diagnosis not present

## 2016-03-28 NOTE — Progress Notes (Signed)
Phone: [phone]  Subjective:  Patient presents today for their annual physical. Chief complaint-noted.   See problem oriented charting- ROS- full  review of systems was completed and negative except for: noted shoulder pain, sweating in palms and soles  The following were reviewed and entered/updated in epic: Past Medical History:  Diagnosis Date  . Allergy   . Central sleep apnea    AHI of 44 exacerbated to 52 and failed CPAP  . Colon cancer ([place])    polpy removed from colon that was cancerous  . Complex sleep apnea syndrome    baseline AHI of 44 on 04-21-11, changed to adapt SV    . Complication of anesthesia    Patient has hard time waking up  . GERD (gastroesophageal reflux disease)   . HEMORRHOIDS-INTERNAL 10/01/2007   Qualifier: Diagnosis of  By: Fuller Plan MD Marijo Conception High cholesterol   . PONV (postoperative nausea and vomiting)   . Psoriasis   . Right elbow pain 08/19/2015   Patient Active Problem List   Diagnosis Date Noted  . Psoriasis 02/22/2016    Priority: High  . Severe motion sickness 02/22/2016    Priority: High  . History of colon cancer 10/01/2007    Priority: High  . Groin rash 02/22/2016    Priority: Medium  . Hyperhidrosis of palms and soles 02/22/2016    Priority: Medium  . Hyperlipidemia 09/05/2013    Priority: Medium  . Gastroesophageal reflux disease without esophagitis 09/05/2013    Priority: Medium  . Sleep apnea 09/28/2011    Priority: Medium  . Left hamstring muscle strain 12/08/2014    Priority: Low   Past Surgical History:  Procedure Laterality Date  . COLONOSCOPY  2009   CANCEROUS POLYPS REMOVED  . KNEE ARTHROSCOPY Left 1979  . KNEE ARTHROSCOPY Right 08/22/2012   Procedure: RIGHT KNEE ARTHROSCOPY WITH DEBRIDEMENT ;  Surgeon: Marin Shutter, MD;  Location: [facility];  Service: Orthopedics;  Laterality: Right;  . WRIST FRACTURE SURGERY Left ~ 1995    Family History  Problem Relation Age of Onset  . Sleep disorder Father     . Hyperlipidemia Father   . Sleep disorder Paternal Grandfather   . Heart disease Paternal Grandfather   . Cancer Paternal Grandmother     breast  . Breast cancer Mother   . Alcohol abuse Mother   . Eating disorder Mother   . Diabetes Maternal Grandmother   . Heart disease Maternal Grandfather     late life  . Hyperlipidemia Maternal Grandfather   . Stroke Maternal Grandfather     late life    Medications- reviewed and updated Current Outpatient Prescriptions  Medication Sig Dispense Refill  . aluminum chloride (HYPERCARE) 20 % external solution Apply topically at bedtime. 35 mL 0  . atorvastatin (LIPITOR) 40 MG tablet TAKE ONE TABLET BY MOUTH ONCE DAILY. 90 tablet 3  . Calcipotriene 0.005 % solution Apply topically 2 times daily. 60 mL 11  . clotrimazole-betamethasone (LOTRISONE) cream Apply 1 application topically 2 (two) times daily. 30 g 2  . fluticasone (CUTIVATE) 0.05 % cream Apply topically 2 (two) times daily. 30 g 5  . omeprazole (PRILOSEC) 20 MG capsule Take 20 mg by mouth daily.    . promethazine (PHENERGAN) 12.5 MG tablet Take 1 tablet (12.5 mg total) by mouth every 6 (six) hours as needed for nausea or vomiting. 30 tablet 1   Allergies-reviewed and updated Allergies  Allergen Reactions  . Cortisone   .  Depo-Medrol [Methylprednisolone Acetate] Other (See Comments)    Blurry vision- blisters behind retinas  . Codeine     REACTION: nausea, vomiting  . Other     Does/t likt narcotics and cannot take steroids     Social History   Social History  . Marital status: Married    Spouse name: N/A  . Number of children: 2  . Years of education: BS   Occupational History  .      [employer] History Main Topics  . Smoking status: Never Smoker  . Smokeless tobacco: Never Used  . Alcohol use 0.0 oz/week     Comment: 08/22/2012 "maybe 1 beer q 2 wks"  . Drug use: No  . Sexual activity: Yes   Other Topics Concern  . None    Social History Narrative   Married. 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 Oropharynx normal Neck: no thyromegaly CV: RRR no murmurs rubs or gallops Lungs: CTAB no crackles, wheeze, rhonchi Abdomen: soft/nontender/nondistended/normal bowel sounds. No rebound or guarding.  Ext: no edema Skin: warm, dry Neuro: grossly normal, moves all extremities, PERRLA Rectal: normal tone, normal sized prostate, no masses or tenderness  Assessment/Plan:  56 y.o. male presenting for annual physical.  Health Maintenance counseling: 1. Anticipatory guidance: Patient counseled regarding regular dental exams q6 months, eye exams - last year and thinking about updating exam- has some low strength contacts but stopped, wearing seatbelts.  2. Risk factor reduction:  Advised patient of need for regular exercise and diet rich and fruits and vegetables to reduce risk of heart attack and stroke. Exercise- active ourdoors. Diet-diet reasonable.  Wt Readings from Last 3 Encounters:  03/28/16 155 lb 6.4 oz (70.5 kg)  02/22/16 154 lb 12.8 oz (70.2 kg)  08/18/15 150 lb (68 kg)  3. Immunizations/screenings/ancillary studies- discussed HIV screening option - declines for now Immunization History  Administered Date(s) Administered  . Influenza-Unspecified 09/21/2015  . Tdap 06/03/2006  4. Prostate cancer screening-  low risk PSA and rectal exam.  Lab Results  Component Value Date   PSA 0.67 03/21/2016   PSA 0.76  03/10/2015   PSA 0.86 07/09/2013   5. Colon cancer screening - 04/04/15 with 3 year repeat due to history 6. Skin cancer screening- advised regular sunscreen.   Status of chronic or acute concerns  Motion sickness- see prior note. Dramamine before trips advised- he has done this and has been much better. Could do neurology visit- declines for now .   GERD- trial zantac 150mg  BID advised- he is going to do this when runs out of omeprazole. Burping/belching had resolved on omeprazole 20mg   Hyperhidrosis palms and soles- aluminum chloride 20% topical nightly. See notes 02/22/16. Has not been as bad not used medication lately- still has on hand if needed.   HLD- on atorvastatin 40 mg well controlled  Left shoulder pain-  rotator cuff strain thought- advised PT 4 weeks 3x a week as long as pain 1-2/10. Sees [facility].  - he states he thinks he may have pushed too hard through a stretch instead- if stretches lighter feels better. Does not want to take time off golf game or stretching so likely will persist.   Hyperglycemia- cbg 109. Will monitor. Lifestyle reasonable. Will monitor.   1 year CPE or sooner if needed  Return precautions advised.   Garret Reddish, MD

## 2016-03-28 NOTE — Progress Notes (Signed)
Pre visit review using our clinic review tool, if applicable. No additional management support is needed unless otherwise documented below in the visit note. 

## 2016-03-28 NOTE — Patient Instructions (Addendum)
When you run out of omeprazole- trial zantac/ranitidine 150mg  twice a day. If do well on twice a day could try with dinner only after a month  Could consider neurology visit for the severe motion sickness if you would like in the future  More frequent but lighter stretching for that left shoulder  At risk for diabetes with blood sugar 109 (at risk from 100-125). Healthy eating, regular exercise advised. Recheck 1 year at physical

## 2016-04-25 ENCOUNTER — Other Ambulatory Visit: Payer: Self-pay

## 2016-04-25 ENCOUNTER — Telehealth: Payer: Self-pay | Admitting: Family Medicine

## 2016-04-25 DIAGNOSIS — Z1283 Encounter for screening for malignant neoplasm of skin: Secondary | ICD-10-CM

## 2016-04-25 NOTE — Telephone Encounter (Signed)
Referral placed for skin cancer screening with dermatology

## 2016-04-25 NOTE — Telephone Encounter (Signed)
Pt would like to have a referral to a dermatologist for a concern of a mole on R hamstring pt state that he had a CPE last week w/Dr. Yong Channel and did not notice it then and refuse an appointment.

## 2016-05-04 DIAGNOSIS — L814 Other melanin hyperpigmentation: Secondary | ICD-10-CM | POA: Diagnosis not present

## 2016-05-04 DIAGNOSIS — L918 Other hypertrophic disorders of the skin: Secondary | ICD-10-CM | POA: Diagnosis not present

## 2016-05-04 DIAGNOSIS — D225 Melanocytic nevi of trunk: Secondary | ICD-10-CM | POA: Diagnosis not present

## 2016-12-28 ENCOUNTER — Encounter: Payer: Self-pay | Admitting: Adult Health

## 2016-12-28 ENCOUNTER — Ambulatory Visit: Payer: BLUE CROSS/BLUE SHIELD | Admitting: Adult Health

## 2016-12-28 ENCOUNTER — Telehealth: Payer: Self-pay | Admitting: Family Medicine

## 2016-12-28 VITALS — BP 100/64 | Temp 98.3°F | Wt 151.0 lb

## 2016-12-28 DIAGNOSIS — S0501XA Injury of conjunctiva and corneal abrasion without foreign body, right eye, initial encounter: Secondary | ICD-10-CM

## 2016-12-28 MED ORDER — ERYTHROMYCIN 5 MG/GM OP OINT: TOPICAL_OINTMENT | Freq: Four times a day (QID) | OPHTHALMIC | 0 refills | Status: DC

## 2016-12-28 NOTE — Progress Notes (Signed)
Subjective:    Patient ID: Alejandro [name], male    DOB: 1960/07/16, 56 y.o.   MRN: [id number]  HPI  56 year old male who  has a past medical history of Allergy, Central sleep apnea, Colon cancer ([place]), Complex sleep apnea syndrome, Complication of anesthesia, GERD (gastroesophageal reflux disease), HEMORRHOIDS-INTERNAL (10/01/2007), High cholesterol, PONV (postoperative nausea and vomiting), Psoriasis, and Right elbow pain (08/19/2015).  He is a patient of Dr. Yong Channel who I am seeing today for an acute complaint of right eye pain. Reports pain started 2-3 days ago but has become worse. Possibly trauma with scratching his eye. Endorses pain, itchiness, and light sensitivity. Denies any purulent discharge.   Review of Systems See HPI   Past Medical History:  Diagnosis Date  . Allergy   . Central sleep apnea    AHI of 44 exacerbated to 52 and failed CPAP  . Colon cancer ([place])    polpy removed from colon that was cancerous  . Complex sleep apnea syndrome    baseline AHI of 44 on 04-21-11, changed to adapt SV    . Complication of anesthesia    Patient has hard time waking up  . GERD (gastroesophageal reflux disease)   . HEMORRHOIDS-INTERNAL 10/01/2007   Qualifier: Diagnosis of  By: Fuller Plan MD Marijo Conception High cholesterol   . PONV (postoperative nausea and vomiting)   . Psoriasis   . Right elbow pain 08/19/2015    Social History   Socioeconomic History  . Marital status: Married    Spouse name: Not on file  . Number of children: 2  . Years of education: BS  . Highest education level: Not on file  Social Needs  . Financial resource strain: Not on file  . Food insecurity - worry: Not on file  . Food insecurity - inability: Not on file  . Transportation needs - medical: Not on file  . Transportation needs - non-medical: Not on file  Occupational History    Comment: [employer] Use  . Smoking status: Never Smoker  . Smokeless tobacco:  Never Used  Substance and Sexual Activity  . Alcohol use: Yes    Alcohol/week: 0.0 oz    Comment: 08/22/2012 "maybe 1 beer q 2 wks"  . Drug use: No  . Sexual activity: Yes  Other Topics Concern  . Not on file  Social History Narrative   Married. 2 grown children- 18 (cosemetology school) and 22 ([employer] grad 2018) in 2018      BA at [employer] major. 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 COLONOSCOPY  2009   CANCEROUS POLYPS REMOVED  . KNEE ARTHROSCOPY Left 1979  . KNEE ARTHROSCOPY Right 08/22/2012   Procedure: RIGHT KNEE ARTHROSCOPY WITH DEBRIDEMENT ;  Surgeon: Marin Shutter, MD;  Location: [place];  Service: Orthopedics;  Laterality: Right;  . WRIST FRACTURE SURGERY Left ~ 1995    Family History  Problem Relation Age of Onset  . Sleep disorder Father   . Hyperlipidemia Father   . Sleep disorder Paternal Grandfather   . Heart disease Paternal Grandfather   . Cancer Paternal Grandmother        breast  . Breast cancer Mother   . Alcohol abuse Mother   . Eating disorder Mother   .  Diabetes Maternal Grandmother   . Heart disease Maternal Grandfather        late life  . Hyperlipidemia Maternal Grandfather   . Stroke Maternal Grandfather        late life    Allergies  Allergen Reactions  . Cortisone   . Depo-Medrol [Methylprednisolone Acetate] Other (See Comments)    Blurry vision- blisters behind retinas  . Codeine     REACTION: nausea, vomiting  . Other     Does/t likt narcotics and cannot take steroids     Current Outpatient Medications on File Prior to Visit  Medication Sig Dispense Refill  . aluminum chloride (HYPERCARE) 20 % external solution Apply topically at bedtime. 35 mL 0  . atorvastatin (LIPITOR) 40 MG tablet TAKE ONE TABLET BY MOUTH ONCE DAILY. 90 tablet 3  . Calcipotriene 0.005 %  solution Apply topically 2 times daily. 60 mL 11  . clotrimazole-betamethasone (LOTRISONE) cream Apply 1 application topically 2 (two) times daily. 30 g 2  . fluticasone (CUTIVATE) 0.05 % cream Apply topically 2 (two) times daily. 30 g 5  . omeprazole (PRILOSEC) 20 MG capsule Take 20 mg by mouth daily.    . promethazine (PHENERGAN) 12.5 MG tablet Take 1 tablet (12.5 mg total) by mouth every 6 (six) hours as needed for nausea or vomiting. 30 tablet 1   No current facility-administered medications on file prior to visit.     BP 100/64 (BP Location: Left Arm)   Temp 98.3 F (36.8 C) (Oral)   Wt 151 lb (68.5 kg)   BMI 24.56 kg/m       Objective:   Physical Exam  Constitutional: He appears well-developed and well-nourished. No distress.  Eyes: Conjunctivae, EOM and lids are normal. Pupils are equal, round, and reactive to light. Lids are everted and swept, no foreign bodies found. Right conjunctiva is not injected. Right conjunctiva has no hemorrhage. Left conjunctiva is not injected. Left conjunctiva has no hemorrhage.  Slit lamp exam:      The right eye shows corneal abrasion and fluorescein uptake.  Skin: He is not diaphoretic.  Vitals reviewed.     Assessment & Plan:  1. Abrasion of right cornea, initial encounter - erythromycin (ROMYCIN) ophthalmic ointment; Place into the right eye 4 (four) times daily. Apply thin ribbon to affected eye(s) once daily for 3-5 days  Dispense: 1 g; Refill: 0 - Follow up with PCP if no improvement in 2-3 days   Dorothyann Peng, NP

## 2016-12-28 NOTE — Telephone Encounter (Signed)
Copied from [place] [id number]. Topic: Quick Communication - See Telephone Encounter >> Dec 28, 2016  4:02 PM Ivar Drape wrote: CRM for notification. See Telephone encounter for:  12/28/16. Wells Guiles with [facility] [phone] needs to clarify a prescription that was sent over by [facility]. The patient is right in front of her, for the Erythromycin ointment.

## 2016-12-28 NOTE — Telephone Encounter (Signed)
Spoke to pharmacy and informed her of below instructions from [place].  apply to right eye 4 x daily for 3-4 days  RIBBON!!!!!!!!!!!!!!!!!!!!!!!!!!!!!!!!!!!!!!!!!!!!!!!!!!!!!!!!!!!!!!!!!!!!!!!!!! 3-5 days   No further action needed.  Will close note.

## 2017-01-01 ENCOUNTER — Ambulatory Visit: Payer: BLUE CROSS/BLUE SHIELD | Admitting: Internal Medicine

## 2017-01-01 ENCOUNTER — Telehealth: Payer: Self-pay | Admitting: Family Medicine

## 2017-01-01 ENCOUNTER — Encounter: Payer: Self-pay | Admitting: Internal Medicine

## 2017-01-01 VITALS — BP 122/78 | HR 62 | Temp 97.7°F | Ht 65.0 in | Wt 155.0 lb

## 2017-01-01 DIAGNOSIS — Z23 Encounter for immunization: Secondary | ICD-10-CM

## 2017-01-01 DIAGNOSIS — J019 Acute sinusitis, unspecified: Secondary | ICD-10-CM | POA: Insufficient documentation

## 2017-01-01 MED ORDER — AZITHROMYCIN 250 MG PO TABS: ORAL_TABLET | ORAL | 1 refills | Status: DC

## 2017-01-01 MED ORDER — HYDROCODONE-HOMATROPINE 5-1.5 MG/5ML PO SYRP: 5.0000 mL | ORAL_SOLUTION | Freq: Four times a day (QID) | ORAL | 0 refills | Status: AC | PRN

## 2017-01-01 NOTE — Progress Notes (Signed)
Subjective:    Patient ID: Alejandro [name], male    DOB: 05/08/1960, 56 y.o.   MRN: [id number]  HPI   Here with 2-3 days acute onset fever, facial pain, pressure, headache, general weakness and malaise, and greenish d/c, with mild ST and cough, but pt denies chest pain, wheezing, increased sob or doe, orthopnea, PND, increased LE swelling, palpitations, dizziness or syncope.  Dayquil and sudafed not helping, nothing makes worse. Past Medical History:  Diagnosis Date  . Allergy   . Central sleep apnea    AHI of 44 exacerbated to 52 and failed CPAP  . Colon cancer ([place])    polpy removed from colon that was cancerous  . Complex sleep apnea syndrome    baseline AHI of 44 on 04-21-11, changed to adapt SV    . Complication of anesthesia    Patient has hard time waking up  . GERD (gastroesophageal reflux disease)   . HEMORRHOIDS-INTERNAL 10/01/2007   Qualifier: Diagnosis of  By: Fuller Plan MD Marijo Conception High cholesterol   . PONV (postoperative nausea and vomiting)   . Psoriasis   . Right elbow pain 08/19/2015   Past Surgical History:  Procedure Laterality Date  . COLONOSCOPY  2009   CANCEROUS POLYPS REMOVED  . KNEE ARTHROSCOPY Left 1979  . KNEE ARTHROSCOPY Right 08/22/2012   Procedure: RIGHT KNEE ARTHROSCOPY WITH DEBRIDEMENT ;  Surgeon: Marin Shutter, MD;  Location: [place];  Service: Orthopedics;  Laterality: Right;  . WRIST FRACTURE SURGERY Left ~ 1995    reports that  has never smoked. he has never used smokeless tobacco. He reports that he drinks alcohol. He reports that he does not use drugs. family history includes Alcohol abuse in his mother; Breast cancer in his mother; Cancer in his paternal grandmother; Diabetes in his maternal grandmother; Eating disorder in his mother; Heart disease in his maternal grandfather and paternal grandfather; Hyperlipidemia in his father and maternal grandfather; Sleep disorder in his father and paternal grandfather; Stroke in his maternal  grandfather. Allergies  Allergen Reactions  . Cortisone   . Depo-Medrol [Methylprednisolone Acetate] Other (See Comments)    Blurry vision- blisters behind retinas  . Codeine     REACTION: nausea, vomiting  . Other     Does/t likt narcotics and cannot take steroids    Current Outpatient Medications on File Prior to Visit  Medication Sig Dispense Refill  . aluminum chloride (HYPERCARE) 20 % external solution Apply topically at bedtime. 35 mL 0  . atorvastatin (LIPITOR) 40 MG tablet TAKE ONE TABLET BY MOUTH ONCE DAILY. 90 tablet 3  . Calcipotriene 0.005 % solution Apply topically 2 times daily. 60 mL 11  . clotrimazole-betamethasone (LOTRISONE) cream Apply 1 application topically 2 (two) times daily. 30 g 2  . erythromycin (ROMYCIN) ophthalmic ointment Place into the right eye 4 (four) times daily. Apply thin ribbon to affected eye(s) once daily for 3-5 days 1 g 0  . fluticasone (CUTIVATE) 0.05 % cream Apply topically 2 (two) times daily. 30 g 5  . omeprazole (PRILOSEC) 20 MG capsule Take 20 mg by mouth daily.    . promethazine (PHENERGAN) 12.5 MG tablet Take 1 tablet (12.5 mg total) by mouth every 6 (six) hours as needed for nausea or vomiting. 30 tablet 1   No current facility-administered medications on file prior to visit.    Review of Systems All otherwise neg per pt    Objective:   Physical Exam BP 122/78 (BP  Location: Left Arm, Patient Position: Sitting, Cuff Size: Normal)   Pulse 62   Temp 97.7 F (36.5 C) (Oral)   Ht 5\' 5"  (1.651 m)   Wt 155 lb (70.3 kg)   SpO2 99%   BMI 25.79 kg/m  VS noted, mild ill Constitutional: Pt appears in NAD HENT: Head: NCAT.  Right Ear: External ear normal.  Left Ear: External ear normal.  Eyes: . Pupils are equal, round, and reactive to light. Conjunctivae and EOM are normal Bilat tm's with mild erythema.  Max sinus areas mild tender.  Pharynx with mild erythema, no exudate Nose: without d/c or deformity Neck: Neck supple. Gross normal  ROM Cardiovascular: Normal rate and regular rhythm.   Pulmonary/Chest: Effort normal and breath sounds without rales or wheezing.  Neurological: Pt is alert. At baseline orientation, motor grossly intact Skin: Skin is warm. No rashes, other new lesions, no LE edema Psychiatric: Pt behavior is normal without agitation  No other exam findings    Assessment & Plan:

## 2017-01-01 NOTE — Assessment & Plan Note (Signed)
Mild to mod, for antibx course,  to f/u any worsening symptoms or concerns 

## 2017-01-01 NOTE — Patient Instructions (Addendum)
You had the Tdap Tetanus shot today  Please take all new medication as prescribed - the antibiotic, and cough medicine if needed  You can also take Mucinex D (or it's generic off brand) for congestion, and tylenol as needed for pain.  Please continue all other medications as before, and refills have been done if requested.  Please have the pharmacy call with any other refills you may need..  Please keep your appointments with your specialists as you may have planned

## 2017-01-01 NOTE — Telephone Encounter (Signed)
Called in c/o his antibiotic Rx being sent to the wrong pharmacy.   It was supposed to go to [facility] on [address] instead of the [facility] on [address].    I offered to call Kristopher Oppenheim and have it transferred to [facility] for him.   He became impatient and just decided he would go up the street to the [facility] and pick up the Rx because he needed it now.

## 2017-01-08 ENCOUNTER — Ambulatory Visit: Payer: BLUE CROSS/BLUE SHIELD | Admitting: Family Medicine

## 2017-01-08 ENCOUNTER — Ambulatory Visit: Payer: Self-pay | Admitting: *Deleted

## 2017-01-08 ENCOUNTER — Encounter: Payer: Self-pay | Admitting: Family Medicine

## 2017-01-08 VITALS — BP 110/64 | HR 87 | Temp 97.7°F | Resp 16 | Ht 66.0 in | Wt 156.8 lb

## 2017-01-08 DIAGNOSIS — J329 Chronic sinusitis, unspecified: Secondary | ICD-10-CM

## 2017-01-08 DIAGNOSIS — B9689 Other specified bacterial agents as the cause of diseases classified elsewhere: Secondary | ICD-10-CM

## 2017-01-08 MED ORDER — AMOXICILLIN-POT CLAVULANATE 875-125 MG PO TABS: 1.0000 | ORAL_TABLET | Freq: Two times a day (BID) | ORAL | 0 refills | Status: AC

## 2017-01-08 NOTE — Patient Instructions (Signed)
Sinsusitis Bacterial based on: Symptoms >10 days, double sickening, or severe symptoms in first 3 days   Treatment: -considered steroid: we opted out due to side effects -other symptomatic care with delsym (cough) and mucinex (congestion) or combo in form of mucinex- DM  Mucinex D should be avoided at night as can keep you up. In general I avoid decogestants/ medicine with D BUT DM is fine as that is dextromethorphan/delsym  -Antibiotic indicated: yes, augmentin and should take with meals  Finally, we reviewed reasons to return to care including if symptoms worsen or persist or new concerns arise (particularly fever or shortness of breath)

## 2017-01-08 NOTE — Telephone Encounter (Signed)
Azithromycin is only used for 5 days. It is not used for periods longer than that- it does linger in the system though.   If it is not adequate for treatment we need to consider other options- how about scheduling him for a Tuesday or Wednesday visit so we can assess if he is having rebound/worsening symptoms?

## 2017-01-08 NOTE — Progress Notes (Signed)
PCP: Marin Olp, MD  Subjective:  Alejandro [name] is a 57 y.o. year old very pleasant male patient who presents with sinusitis symptoms including nasal congestion, sinus tenderness -other symptoms include: green discharge at times and clear at times.  -day of illness: at least day 10 - he was treated on day 3 of symptoms with azithromycin through our Saturday clinic.  -Symptoms are improving but have plateaud for 3 days since stopping azithromycin -previous treatments: azithromycin as above. Does not tolerate steroids.  -sick contacts/travel/risks: denies flu exposure.   ROS-denies fever, SOB, NVD  Pertinent Past Medical History-  Patient Active Problem List   Diagnosis Date Noted  . Psoriasis 02/22/2016    Priority: High  . Severe motion sickness 02/22/2016    Priority: High  . History of colon cancer 10/01/2007    Priority: High  . Groin rash 02/22/2016    Priority: Medium  . Hyperhidrosis of palms and soles 02/22/2016    Priority: Medium  . Hyperlipidemia 09/05/2013    Priority: Medium  . Gastroesophageal reflux disease without esophagitis 09/05/2013    Priority: Medium  . Sleep apnea 09/28/2011    Priority: Medium  . Left hamstring muscle strain 12/08/2014    Priority: Low  . Acute sinus infection 01/01/2017    Medications- reviewed  Current Outpatient Medications  Medication Sig Dispense Refill  . aluminum chloride (HYPERCARE) 20 % external solution Apply topically at bedtime. 35 mL 0  . atorvastatin (LIPITOR) 40 MG tablet TAKE ONE TABLET BY MOUTH ONCE DAILY. 90 tablet 3  . Calcipotriene 0.005 % solution Apply topically 2 times daily. 60 mL 11  . clotrimazole-betamethasone (LOTRISONE) cream Apply 1 application topically 2 (two) times daily. 30 g 2  . fluticasone (CUTIVATE) 0.05 % cream Apply topically 2 (two) times daily. 30 g 5  . omeprazole (PRILOSEC) 20 MG capsule Take 20 mg by mouth daily.    . promethazine (PHENERGAN) 12.5 MG tablet Take 1 tablet  (12.5 mg total) by mouth every 6 (six) hours as needed for nausea or vomiting. 30 tablet 1  . azithromycin (ZITHROMAX Z-PAK) 250 MG tablet 2 tab by mouth day 1, then 1 per day (Patient not taking: Reported on 01/08/2017) 6 tablet 1  . erythromycin (ROMYCIN) ophthalmic ointment Place into the right eye 4 (four) times daily. Apply thin ribbon to affected eye(s) once daily for 3-5 days (Patient not taking: Reported on 01/08/2017) 1 g 0  . HYDROcodone-homatropine (HYCODAN) 5-1.5 MG/5ML syrup Take 5 mLs by mouth every 6 (six) hours as needed for up to 10 days for cough. (Patient not taking: Reported on 01/08/2017) 180 mL 0   No current facility-administered medications for this visit.     Objective: BP 110/64 (BP Location: Left Arm, Patient Position: Sitting, Cuff Size: Normal)   Pulse 87   Temp 97.7 F (36.5 C) (Oral)   Resp 16   Ht 5\' 6"  (1.676 m)   Wt 156 lb 12.8 oz (71.1 kg)   SpO2 97%   BMI 25.31 kg/m  Gen: NAD, resting comfortably HEENT: Turbinates erythematous with green drainage, TM normal, pharynx mildly erythematous with no tonsilar exudate or edema, maxillary sinus tenderness CV: RRR no murmurs rubs or gallops Lungs: CTAB no crackles, wheeze, rhonchi Ext: no edema Skin: warm, dry, no rash  Assessment/Plan:  Sinsusitis Bacterial based on: Symptoms >10 days, double sickening, or severe symptoms in first 3 days  Treatment: -considered steroid: we opted out due to side effects -other symptomatic care with delsym (  cough) and mucinex (congestion) or combo in form of mucinex- DM -Antibiotic indicated: yes, augmentin and should take with meals  Finally, we reviewed reasons to return to care including if symptoms worsen or persist or new concerns arise (particularly fever or shortness of breath)  Meds ordered this encounter  Medications  . amoxicillin-clavulanate (AUGMENTIN) 875-125 MG tablet    Sig: Take 1 tablet by mouth 2 (two) times daily for 7 days.    Dispense:  14 tablet     Refill:  0   Garret Reddish, MD

## 2017-01-08 NOTE — Telephone Encounter (Signed)
Patient was seen today.

## 2017-01-08 NOTE — Telephone Encounter (Signed)
Patient states he was seen on New Year"s Eve and treated for sinus infection. Patient states he is 50% better. He is still coughing up green mucus- but otherwise he is doing well. He does not want to regress when he stops the antibiotic. He states he did not need to fill the Rx for cough syrup- he only needed the ATB.  He is off work today and can come to the office if needed- but would like to continue the antibiotic for another dose. He can be reached at [phone]. He uses [facility].

## 2017-05-03 ENCOUNTER — Other Ambulatory Visit: Payer: Self-pay | Admitting: Family Medicine

## 2017-06-14 ENCOUNTER — Ambulatory Visit (INDEPENDENT_AMBULATORY_CARE_PROVIDER_SITE_OTHER)
Admission: RE | Admit: 2017-06-14 | Discharge: 2017-06-14 | Disposition: A | Discharge status: Home / Self Care | Payer: BLUE CROSS/BLUE SHIELD | Source: Ambulatory Visit | Attending: Family Medicine | Admitting: Family Medicine

## 2017-06-14 ENCOUNTER — Ambulatory Visit: Payer: BLUE CROSS/BLUE SHIELD | Admitting: Family Medicine

## 2017-06-14 ENCOUNTER — Encounter: Payer: Self-pay | Admitting: Family Medicine

## 2017-06-14 VITALS — BP 118/78 | HR 66 | Temp 97.5°F | Ht 66.0 in | Wt 152.4 lb

## 2017-06-14 DIAGNOSIS — S060X1A Concussion with loss of consciousness of 30 minutes or less, initial encounter: Secondary | ICD-10-CM | POA: Diagnosis not present

## 2017-06-14 DIAGNOSIS — S0990XA Unspecified injury of head, initial encounter: Secondary | ICD-10-CM | POA: Diagnosis not present

## 2017-06-14 DIAGNOSIS — R55 Syncope and collapse: Secondary | ICD-10-CM

## 2017-06-14 DIAGNOSIS — R9431 Abnormal electrocardiogram [ECG] [EKG]: Secondary | ICD-10-CM | POA: Diagnosis not present

## 2017-06-14 LAB — COMPREHENSIVE METABOLIC PANEL
ALT: 20 U/L (ref 0–53)
AST: 21 U/L (ref 0–37)
Albumin: 4.5 g/dL (ref 3.5–5.2)
Alkaline Phosphatase: 112 U/L (ref 39–117)
BUN: 19 mg/dL (ref 6–23)
CALCIUM: 9.7 mg/dL (ref 8.4–10.5)
CO2: 29 meq/L (ref 19–32)
Chloride: 106 mEq/L (ref 96–112)
Creatinine, Ser: 0.89 mg/dL (ref 0.40–1.50)
GFR: 94.24 mL/min (ref >60.00)
Glucose, Bld: 99 mg/dL (ref 70–99)
Potassium: 4.8 mEq/L (ref 3.5–5.1)
Sodium: 141 mEq/L (ref 135–145)
Total Bilirubin: 0.5 mg/dL (ref 0.2–1.2)
Total Protein: 7.1 g/dL (ref 6.0–8.3)

## 2017-06-14 LAB — CBC
HEMATOCRIT: 43.5 % (ref 39.0–52.0)
HEMOGLOBIN: 15 g/dL (ref 13.0–17.0)
MCHC: 34.4 g/dL (ref 30.0–36.0)
MCV: 85.9 fl (ref 78.0–100.0)
PLATELETS: 250 10*3/uL (ref 150.0–400.0)
RBC: 5.06 Mil/uL (ref 4.22–5.81)
RDW: 13.8 % (ref 11.5–15.5)
WBC: 7.1 10*3/uL (ref 4.0–10.5)

## 2017-06-14 NOTE — Patient Instructions (Addendum)
Health Maintenance Due  Topic Date Due  . HIV Screening - Patient Declined 03/15/1975   I agree with you- likely just dehydrated and then stood up quickly causing vasovagal syncope but we are going to do below workup to be on safe side  Please stop by lab before you go  Hoping to get CT head today or tomorrowJamie please verify plan with Alejandro [name] before he leaves- I think he is to go straight over to CT  We will call you within two weeks about your referral to cardiology. If you do not hear within 3 weeks, give [place] a call.   If you could take tomorrow off work and rest as much as possible without mental or physical stimulation that would be great to help reduce long term potential concussion symptoms. If you are not like 90-95% better into next week you could extend your time off. Happy to see you back if needed  If you have recurrent fainting episode or shaking epsiodes- seek care immediately. See [place] back if chest pain, shortness of breath, heart racing, worsening headaches.

## 2017-06-14 NOTE — Progress Notes (Signed)
Your CBC was normal (blood counts, infection fighting cells, platelets). Your CMET was normal (kidney, liver, and electrolytes, blood sugar).

## 2017-06-14 NOTE — Assessment & Plan Note (Addendum)
Shortened PR interval - Plan: Ambulatory referral to Cardiology S: Monday woke up normal but was hot and humid that day and admits didn't eat well- around noon went out to lunch- felt motion sick and dizzy like he usually would with motion sickness but wasn't moving- symptoms lasted through the day. Felt better next day and was able to eat but admits wasn't drinking fluids much at all. Patient out playing golf on Tuesday night- 3 beers, no food, no water. Got up from cart (rush of blood to brain felt like to him) and put his clubs on and was walking out to ball when he blacked out.  Witnessed by friend who turned when heard patient drop clubs- within steps out cartpath- hit shoulders first and then back of his head. Did have 1 second of shaking and friend yelled at him- he woke up and shaking stopped immediately and no post ictal state- no fecal or urinary incontinence, tongue biting.   Got up drank water- was able to finish the round of golf but felt fatigued. Headache yesterday and Thursday. Getting headaches since that time particularly if has to concentrate. Vision seems worse but feels like this was going on prior to the fall.   A/P:  Suspect syncope related to dehydration, quickly rising from cart.  Now with- Concussion likely due to fall and hitting head with headaches with exerting himself afterwards. Will get CT to rule out brain bleed and eval CVA (far enough out that would be more likely to show on CT)  DDx - updated labs today without obvious cause of syncope -No seizure history. No postictal state- doubt syncope caused by seiure -Doubt CAD. EKG reassuring though obviously doesn't rule MI  -EKG does show short PR interval- which could make arhythmia more likely. Will refer to cardiology for their opinion on cardiac monitoring in light of short pr. Discussed with patient they may not suggest this but I wanted their expertise given the short PR. With possible left atrial hypertrophy- would ask  their opinion on echocardiogram as well.  -reassuring neuro exam today. Doubt CVA- though CT this many days out may show CVA - discussed carotid duplex - with only HLD as risk factor we opted to hold off on this for now.  For concussion -advised avoiding mental or physical stimulation until symptoms improved. encouraged him to take day off tomorrow and strongly avoid screen time- can rest or read easy book or listen to music.

## 2017-06-14 NOTE — Progress Notes (Signed)
Subjective:  Alejandro [name] is a 57 y.o. year old very pleasant male patient who presents for/with See problem oriented charting ROS- full  review of systems was completed and negative except for: appetite change, sweating, fatigue, visual problem, nausea, heat intolerance, urinary frequency, urinary urgency, dizziness, headaches, lightheadednes, passing out, weakness  Past Medical History-  Patient Active Problem List   Diagnosis Date Noted  . Psoriasis 02/22/2016    Priority: High  . Severe motion sickness 02/22/2016    Priority: High  . History of colon cancer 10/01/2007    Priority: High  . Groin rash 02/22/2016    Priority: Medium  . Hyperhidrosis of palms and soles 02/22/2016    Priority: Medium  . Hyperlipidemia 09/05/2013    Priority: Medium  . Gastroesophageal reflux disease without esophagitis 09/05/2013    Priority: Medium  . Sleep apnea 09/28/2011    Priority: Medium  . Left hamstring muscle strain 12/08/2014    Priority: Low  . Syncope 06/14/2017  . Acute sinus infection 01/01/2017   Past Surgical History:  Procedure Laterality Date  . COLONOSCOPY  2009   CANCEROUS POLYPS REMOVED  . KNEE ARTHROSCOPY Left 1979  . KNEE ARTHROSCOPY Right 08/22/2012   Procedure: RIGHT KNEE ARTHROSCOPY WITH DEBRIDEMENT ;  Surgeon: Marin Shutter, MD;  Location: [facility];  Service: Orthopedics;  Laterality: Right;  . WRIST FRACTURE SURGERY Left ~ 1995    Family History  Problem Relation Age of Onset  . Sleep disorder Father   . Hyperlipidemia Father   . Sleep disorder Paternal Grandfather   . Heart disease Paternal Grandfather   . Cancer Paternal Grandmother        breast  . Breast cancer Mother   . Alcohol abuse Mother   . Eating disorder Mother   . Diabetes Maternal Grandmother   . Heart disease Maternal Grandfather        late life  . Hyperlipidemia Maternal Grandfather   . Stroke Maternal Grandfather        late life   Social History   Social History Narrative    Married. 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 aluminum chloride (HYPERCARE) 20 % external solution Apply topically at bedtime. 35 mL 0  . atorvastatin (LIPITOR) 40 MG tablet TAKE 1 TABLET BY MOUTH ONCE DAILY 90 tablet 3  . Calcipotriene 0.005 % solution Apply topically 2 times daily. 60 mL 11  . clotrimazole-betamethasone (LOTRISONE) cream Apply 1 application topically 2 (two) times daily. 30 g 2  . fluticasone (CUTIVATE) 0.05 % cream Apply topically 2 (two) times daily. 30 g 5  . omeprazole (PRILOSEC) 20 MG capsule Take 20 mg by mouth daily.    . promethazine (PHENERGAN) 12.5 MG tablet Take 1 tablet (12.5 mg total) by mouth every 6 (six) hours as needed for nausea or vomiting. 30 tablet 1   No current facility-administered medications for this visit.     Objective: BP 118/78 (BP Location: Left Arm, Patient Position: Sitting, Cuff Size: Normal)   Pulse 66   Temp (!) 97.5 F (36.4 C) (Oral)   Ht 5\' 6"  (1.676 m)   Wt 152 lb 6.4 oz (69.1  kg)   SpO2 97%   BMI 24.60 kg/m  Gen: NAD, resting comfortably CV: RRR no murmurs rubs or gallops Lungs: CTAB no crackles, wheeze, rhonchi Abdomen: soft/nontender/nondistended/normal bowel sounds.  Ext: no edema Skin: warm, dry Neuro: CN II-XII intact, sensation and reflexes normal throughout, 5/5 muscle strength in bilateral upper and lower extremities. Normal finger to nose. Normal rapid alternating movements. No pronator drift. Normal romberg. Normal gait.   EKG: sinus rhythm with rate 61, PR 114, otherwise normal intervals, normal axis, left atrial enlargement based on lead II, no st or t wave  changes  Assessment/Plan:  Syncope Shortened PR interval - Plan: Ambulatory referral to Cardiology S: Monday woke up normal but was hot and humid that day and admits didn't eat well- around noon went out to lunch- felt motion sick and dizzy like he usually would with motion sickness but wasn't moving- symptoms lasted through the day. Felt better next day and was able to eat but admits wasn't drinking fluids much at all. Patient out playing golf on Tuesday night- 3 beers, no food, no water. Got up from cart (rush of blood to brain felt like to him) and put his clubs on and was walking out to ball when he blacked out.  Witnessed by friend who turned when heard patient drop clubs- within steps out cartpath- hit shoulders first and then back of his head. Did have 1 second of shaking and friend yelled at him- he woke up and shaking stopped immediately and no post ictal state- no fecal or urinary incontinence, tongue biting.   Got up drank water- was able to finish the round of golf but felt fatigued. Headache yesterday and Thursday. Getting headaches since that time particularly if has to concentrate. Vision seems worse but feels like this was going on prior to the fall.   A/P:  Suspect syncope related to dehydration, quickly rising from cart.  Now with- Concussion likely due to fall and hitting head with headaches with exerting himself afterwards. Will get CT to rule out brain bleed and eval CVA (far enough out that would be more likely to show on CT)  DDx - updated labs today without obvious cause of syncope -No seizure history. No postictal state- doubt syncope caused by seiure -Doubt CAD. EKG reassuring though obviously doesn't rule MI  -EKG does show short PR interval- which could make arhythmia more likely. Will refer to cardiology for their opinion on cardiac monitoring in light of short pr. Discussed with patient they may not suggest this but I wanted their expertise given the short PR. With  possible left atrial hypertrophy- would ask their opinion on echocardiogram as well.  -reassuring neuro exam today. Doubt CVA- though CT this many days out may show CVA - discussed carotid duplex - with only HLD as risk factor we opted to hold off on this for now.  For concussion -advised avoiding mental or physical stimulation until symptoms improved. encouraged him to take day off tomorrow and strongly avoid screen time- can rest or read easy book or listen to music.    Future Appointments  Date Time Provider [place]  08/17/2017 10:00 AM Marin Olp, MD [facility]  upcoming CPE  Lab/Order associations: Syncope, unspecified syncope type - Plan: EKG 12-Lead, CBC, Comprehensive metabolic panel, CT Head Wo Contrast, Ambulatory referral to Cardiology  Shortened PR interval - Plan: Ambulatory referral to Cardiology  Time Stamp The duration of face-to-face time during this visit was greater than 40 minutes (  1:01 PM to 2:02 PM with  20 minutes of this time used to provide care to another patient while pending EKG. Greater than 50% of this time was spent in counseling, explanation of diagnosis, planning of further management, and/or coordination of care including discussing potential causes, needed/possible workup, counseling on care post concussion, reasons I suspected concussion  Return precautions advised for return visit here vs. ER Garret Reddish, MD

## 2017-06-20 DIAGNOSIS — H2513 Age-related nuclear cataract, bilateral: Secondary | ICD-10-CM | POA: Diagnosis not present

## 2017-06-20 DIAGNOSIS — H35713 Central serous chorioretinopathy, bilateral: Secondary | ICD-10-CM | POA: Diagnosis not present

## 2017-07-20 DIAGNOSIS — H524 Presbyopia: Secondary | ICD-10-CM | POA: Diagnosis not present

## 2017-07-20 DIAGNOSIS — H5203 Hypermetropia, bilateral: Secondary | ICD-10-CM | POA: Diagnosis not present

## 2017-07-24 NOTE — Progress Notes (Signed)
Cardiology Office Note:    Date:  07/25/2017   ID:  Alejandro [name], DOB 11-04-1960, MRN [id number]  PCP:  Alejandro Olp, MD  Cardiologist:  Shirlee More, MD   Referring MD: Alejandro Olp, MD  ASSESSMENT:    1. Syncope, unspecified syncope type   2. Prolonged P-R interval   3. Hyperlipidemia, unspecified hyperlipidemia type    PLAN:    In order of problems listed above:  1. If not for the brief ictal activity he had a very typical vasovagal syncope.  I do think he should have further evaluation with EEG for seizure disorder he requires specific anticonvulsant therapy and I asked him to wear an extended monitor to exclude arrhythmia and I think that if that is normal he does not require things like an ischemia evaluation cardiac echo and implanted loop recorder unless he has recurrent episodes I asked him to be sure to have adequate water intake 3 to 4 L/day in the future if he has a prodrome to place himself supine to avoid loss of consciousness. 2. Normal variant 3. Stable continue his statin  Next appointment 6 weeks   Medication Adjustments/Labs and Tests Ordered: Current medicines are reviewed at length with the patient today.  Concerns regarding medicines are outlined above.  Orders Placed This Encounter  Procedures  . Ambulatory referral to Neurology  . LONG TERM MONITOR (3-14 DAYS)   No orders of the defined types were placed in this encounter.    Chief Complaint  Patient presents with  . New Patient (Initial Visit)    I fainted  . Loss of Consciousness    History of Present Illness:    Alejandro [name] is a 57 y.o. male who is being seen today for the evaluation of syncope at the request of Alejandro Olp, MD.  He had a recent episode of brief syncope while golfing.  The day prior he did not feel well felt weak the next day without adequate hydration and was golfing he got out of the golf cart felt lightheaded low nauseous tried to walk it  off and fell to the ground struck his head and had a brief ictal episode he had symptoms afterwards are diagnosed as a concussion and he has no previous history of seizure disorder but had a head trauma as a young man skateboarding.  He is never had syncope or presyncope before his EKG shows benign variant of accelerated AV nodal conduction without preexcitation.  With the unusual occurrence of brief ictal activity asked him to have an EEG performed I doubt he has a seizure disorder and to wear an extended 14-day Holter monitor to assess for arrhythmia.  I encouraged him to maintain adequate hydration 3 to 4 L of water per day and in the future if he has a episode where he feels as a faint himself to the ground to avoid loss of consciousness.  If he has recurrent episodes to require further evaluation at this time I do not think he needs a cardiac echo or stress test as he has no evidence of structural heart disease. Past Medical History:  Diagnosis Date  . Allergy   . Central sleep apnea    AHI of 44 exacerbated to 52 and failed CPAP  . Colon cancer ([place])    polpy removed from colon that was cancerous  . Complex sleep apnea syndrome    baseline AHI of 44 on 04-21-11, changed to adapt SV    .  Complication of anesthesia    Patient has hard time waking up  . GERD (gastroesophageal reflux disease)   . HEMORRHOIDS-INTERNAL 10/01/2007   Qualifier: Diagnosis of  By: Fuller Plan MD Marijo Conception High cholesterol   . PONV (postoperative nausea and vomiting)   . Psoriasis   . Right elbow pain 08/19/2015    Past Surgical History:  Procedure Laterality Date  . COLONOSCOPY  2009   CANCEROUS POLYPS REMOVED  . KNEE ARTHROSCOPY Left 1979  . KNEE ARTHROSCOPY Right 08/22/2012   Procedure: RIGHT KNEE ARTHROSCOPY WITH DEBRIDEMENT ;  Surgeon: Alejandro Shutter, MD;  Location: [facility];  Service: Orthopedics;  Laterality: Right;  . WRIST FRACTURE SURGERY Left ~ 1995    Current Medications: Current Meds  Medication  Sig  . atorvastatin (LIPITOR) 40 MG tablet TAKE 1 TABLET BY MOUTH ONCE DAILY  . Calcipotriene 0.005 % solution Apply topically 2 times daily.  . clotrimazole-betamethasone (LOTRISONE) cream Apply 1 application topically 2 (two) times daily.  . fluticasone (CUTIVATE) 0.05 % cream Apply topically 2 (two) times daily.  Marland Kitchen omeprazole (PRILOSEC) 20 MG capsule Take 20 mg by mouth daily.  . ondansetron (ZOFRAN) 4 MG tablet Take 4 mg by mouth every 8 (eight) hours as needed for nausea or vomiting.     Allergies:   Cortisone; Depo-medrol [methylprednisolone acetate]; Codeine; and Other   Social History   Socioeconomic History  . Marital status: Married    Spouse name: Not on file  . Number of children: 2  . Years of education: BS  . Highest education level: Not on file  Occupational History    Comment: [employer]  . Financial resource strain: Not on file  . Food insecurity:    Worry: Not on file    Inability: Not on file  . Transportation needs:    Medical: Not on file    Non-medical: Not on file  Tobacco Use  . Smoking status: Never Smoker  . Smokeless tobacco: Never Used  Substance and Sexual Activity  . Alcohol use: Yes    Alcohol/week: 1.8 - 2.4 oz    Types: 3 - 4 Cans of beer per week    Comment: 3 - 4 beers during some weeks   . Drug use: No  . Sexual activity: Yes  Lifestyle  . Physical activity:    Days per week: Not on file    Minutes per session: Not on file  . Stress: Not on file  Relationships  . Social connections:    Talks on phone: Not on file    Gets together: Not on file    Attends religious service: Not on file    Active member of club or organization: Not on file    Attends meetings of clubs or organizations: Not on file    Relationship status: Not on file  Other Topics Concern  . Not on file  Social History Narrative   Married. 2 grown children- 18 (cosemetology school) and 22 ([employer] grad 2018) in 2018      BA at  [employer] major. 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  ROS:   ROS Please see the history of present illness.    All other systems reviewed and are negative.  EKGs/Labs/Other Studies Reviewed:    The following studies were reviewed today:  EKG 06/14/17 [place] short PR 114 msec without pre excitation Recent Labs: 06/14/2017: ALT 20; BUN 19; Creatinine, Ser 0.89; Hemoglobin 15.0; Platelets 250.0; Potassium 4.8; Sodium 141  Recent Lipid Panel    Component Value Date/Time   CHOL 151 03/21/2016 0837   TRIG 58.0 03/21/2016 0837   HDL 56.60 03/21/2016 0837   CHOLHDL 3 03/21/2016 0837   VLDL 11.6 03/21/2016 0837   LDLCALC 82 03/21/2016 0837    Physical Exam:    VS:  BP 112/82 (BP Location: Left Arm, Patient Position: Sitting, Cuff Size: Normal)   Pulse 66   Ht 5\' 5"  (1.651 m)   Wt 153 lb 1.9 oz (69.5 kg)   SpO2 99%   BMI 25.48 kg/m     Wt Readings from Last 3 Encounters:  07/25/17 153 lb 1.9 oz (69.5 kg)  06/14/17 152 lb 6.4 oz (69.1 kg)  01/08/17 156 lb 12.8 oz (71.1 kg)     GEN:  Well nourished, well developed in no acute distress HEENT: Normal NECK: No JVD; No carotid bruits LYMPHATICS: No lymphadenopathy CARDIAC: RRR, no murmurs, rubs, gallops RESPIRATORY:  Clear to auscultation without rales, wheezing or rhonchi  ABDOMEN: Soft, non-tender, non-distended MUSCULOSKELETAL:  No edema; No deformity  SKIN: Warm and dry NEUROLOGIC:   Alert and oriented x 3 PSYCHIATRIC:  Normal affect     Signed, Shirlee More, MD  07/25/2017 12:05 PM    [place]

## 2017-07-25 ENCOUNTER — Encounter: Payer: Self-pay | Admitting: Cardiology

## 2017-07-25 ENCOUNTER — Ambulatory Visit: Payer: BLUE CROSS/BLUE SHIELD | Admitting: Cardiology

## 2017-07-25 VITALS — BP 112/82 | HR 66 | Ht 65.0 in | Wt 153.1 lb

## 2017-07-25 DIAGNOSIS — I440 Atrioventricular block, first degree: Secondary | ICD-10-CM

## 2017-07-25 DIAGNOSIS — R55 Syncope and collapse: Secondary | ICD-10-CM

## 2017-07-25 DIAGNOSIS — E785 Hyperlipidemia, unspecified: Secondary | ICD-10-CM

## 2017-07-25 NOTE — Patient Instructions (Addendum)
Medication Instructions:  Your physician recommends that you continue on your current medications as directed. Please refer to the Current Medication list given to you today.   Labwork: None.  Testing/Procedures: Your physician has recommended that you wear a holter monitor. Holter monitors are medical devices that record the heart's electrical activity. Doctors most often use these monitors to diagnose arrhythmias. Arrhythmias are problems with the speed or rhythm of the heartbeat. The monitor is a small, portable device. You can wear one while you do your normal daily activities. This is usually used to diagnose what is causing palpitations/syncope (passing out). Wear for 14 days.     Follow-Up: Your physician recommends that you schedule a follow-up appointment in: 6 weeks.   Dr. Bettina Gavia has referred you to neurology for an eeg. Someone should contact you with this appointment. If you do not hear anything from neurology please contact our office.   Any Other Special Instructions Will Be Listed Below (If Applicable).     If you need a refill on your cardiac medications before your next appointment, please call your pharmacy.    Syncope Syncope is when you lose temporarily pass out (faint). Signs that you may be about to pass out include:  Feeling dizzy or light-headed.  Feeling sick to your stomach (nauseous).  Seeing all white or all black.  Having cold, clammy skin.  If you passed out, get help right away. Call your local emergency services (911 in the [place] Do not drive yourself to the hospital. Follow these instructions at home: Pay attention to any changes in your symptoms. Take these actions to help with your condition:  Have someone stay with you until you feel stable.  Do not drive, use machinery, or play sports until your doctor says it is okay.  Keep all follow-up visits as told by your doctor. This is important.  If you start to feel like you might pass  out, lie down right away and raise (elevate) your feet above the level of your heart. Breathe deeply and steadily. Wait until all of the symptoms are gone.  Drink enough fluid to keep your pee (urine) clear or pale yellow.  If you are taking blood pressure or heart medicine, get up slowly and spend many minutes getting ready to sit and then stand. This can help with dizziness.  Take over-the-counter and prescription medicines only as told by your doctor.  Get help right away if:  You have a very bad headache.  You have unusual pain in your chest, tummy, or back.  You are bleeding from your mouth or rectum.  You have black or tarry poop (stool).  You have a very fast or uneven heartbeat (palpitations).  It hurts to breathe.  You pass out once or more than once.  You have jerky movements that you cannot control (seizure).  You are confused.  You have trouble walking.  You are very weak.  You have vision problems. These symptoms may be an emergency. Do not wait to see if the symptoms will go away. Get medical help right away. Call your local emergency services (911 in the [place] Do not drive yourself to the hospital. This information is not intended to replace advice given to you by your health care provider. Make sure you discuss any questions you have with your health care provider. Document Released: 06/07/2007 Document Revised: 05/27/2015 Document Reviewed: 09/02/2014 Elsevier Interactive Patient Education  2018 [employer].  Electroencephalogram, Adult An electroencephalogram (EEG) is a test  that records electrical activity in the brain. It is often used to diagnose or monitor problems that are related to the brain, such as:  Seizure disorders.  Sleeping problems.  Changes in behavior.  Tell a health care provider about:  Any allergies you have.  All medicines you are taking, including vitamins, herbs, eye drops, creams, and over-the-counter medicines.  Any  problems you or family members have had with anesthetic medicines.  Any blood disorders you have.  Any surgeries you have had.  Any medical conditions you may have, including psychiatric conditions.  Any history of illegal drug use or alcohol abuse. What are the risks? Generally, this is a safe test. If you have a seizure disorder, you may be made to have a seizure during the test. This is done so that your brain activity can be recorded during the seizure. What happens before the procedure?  Arrive with your hair clean and dry. Do not tease your hair, and do not put hair spray or oil in your hair.  Do not have any caffeine during the 4 hours before your test.  Follow instructions from your health care provider about sleeping, eating, or taking medicines before the test. What happens during the procedure? You will be asked to sit in a chair or lie down. Small metal discs (electrodes) will be attached to your head with an adhesive. These electrodes will pick up on the signals in your brain, and a machine will record the signals. During the test you may be asked to:  Sit or lie quietly and relax.  Open and close your eyes.  Breathe deeply for three minutes.  Look at a flashing light for a short period of time.  Go to sleep.  When the test is complete, the electrodes will be removed by using a solution such as acetone or fingernail polish remover. What happens after the procedure? It is your responsibility to get your test results. Ask the lab or department performing the test when and how you will get your results. This information is not intended to replace advice given to you by your health care provider. Make sure you discuss any questions you have with your health care provider. Document Released: 12/17/1999 Document Revised: 05/17/2015 Document Reviewed: 01/26/2014 Elsevier Interactive Patient Education  2018 [employer].   Holter Monitoring A Holter monitor is a small  device that is used to detect abnormal heart rhythms. It clips to your clothing and is connected by wires to flat, sticky disks (electrodes) that attach to your chest. It is worn continuously for 24-48 hours. Follow these instructions at home:  Wear your Holter monitor at all times, even while exercising and sleeping, for as long as directed by your health care provider.  Make sure that the Holter monitor is safely clipped to your clothing or close to your body as recommended by your health care provider.  Do not get the monitor or wires wet.  Do not put body lotion or moisturizer on your chest.  Keep your skin clean.  Keep a diary of your daily activities, such as walking and doing chores. If you feel that your heartbeat is abnormal or that your heart is fluttering or skipping a beat: ? Record what you are doing when it happens. ? Record what time of day the symptoms occur.  Return your Holter monitor as directed by your health care provider.  Keep all follow-up visits as directed by your health care provider. This is important. Get help right  away if:  You feel lightheaded or you faint.  You have trouble breathing.  You feel pain in your chest, upper arm, or jaw.  You feel sick to your stomach and your skin is pale, cool, or damp.  You heartbeat feels unusual or abnormal. This information is not intended to replace advice given to you by your health care provider. Make sure you discuss any questions you have with your health care provider. Document Released: 09/17/2003 Document Revised: 05/27/2015 Document Reviewed: 07/28/2013 Elsevier Interactive Patient Education  [employer].

## 2017-07-31 ENCOUNTER — Ambulatory Visit (HOSPITAL_COMMUNITY): Payer: BLUE CROSS/BLUE SHIELD

## 2017-08-17 ENCOUNTER — Encounter: Payer: Self-pay | Admitting: Family Medicine

## 2017-08-17 ENCOUNTER — Ambulatory Visit (INDEPENDENT_AMBULATORY_CARE_PROVIDER_SITE_OTHER): Payer: BLUE CROSS/BLUE SHIELD | Admitting: Family Medicine

## 2017-08-17 VITALS — BP 100/68 | HR 59 | Temp 97.9°F | Ht 65.0 in | Wt 156.0 lb

## 2017-08-17 DIAGNOSIS — L409 Psoriasis, unspecified: Secondary | ICD-10-CM | POA: Diagnosis not present

## 2017-08-17 DIAGNOSIS — Z125 Encounter for screening for malignant neoplasm of prostate: Secondary | ICD-10-CM

## 2017-08-17 DIAGNOSIS — E785 Hyperlipidemia, unspecified: Secondary | ICD-10-CM

## 2017-08-17 DIAGNOSIS — K219 Gastro-esophageal reflux disease without esophagitis: Secondary | ICD-10-CM

## 2017-08-17 DIAGNOSIS — Z114 Encounter for screening for human immunodeficiency virus [HIV]: Secondary | ICD-10-CM

## 2017-08-17 DIAGNOSIS — R3915 Urgency of urination: Secondary | ICD-10-CM

## 2017-08-17 DIAGNOSIS — Z0000 Encounter for general adult medical examination without abnormal findings: Secondary | ICD-10-CM | POA: Diagnosis not present

## 2017-08-17 DIAGNOSIS — R55 Syncope and collapse: Secondary | ICD-10-CM

## 2017-08-17 DIAGNOSIS — T753XXA Motion sickness, initial encounter: Secondary | ICD-10-CM

## 2017-08-17 LAB — POC URINALSYSI DIPSTICK (AUTOMATED)
BILIRUBIN UA: NEGATIVE
GLUCOSE UA: NEGATIVE
Ketones, UA: NEGATIVE
Leukocytes, UA: NEGATIVE
NITRITE UA: NEGATIVE
Protein, UA: NEGATIVE
RBC UA: NEGATIVE
Spec Grav, UA: 1.02 (ref 1.010–1.025)
Urobilinogen, UA: 0.2 E.U./dL
pH, UA: 6.5 (ref 5.0–8.0)

## 2017-08-17 LAB — LIPID PANEL
CHOLESTEROL: 142 mg/dL (ref 0–200)
HDL: 58 mg/dL (ref >39.00)
LDL Cholesterol: 69 mg/dL (ref 0–99)
NonHDL: 83.96
TRIGLYCERIDES: 74 mg/dL (ref 0.0–149.0)
Total CHOL/HDL Ratio: 2
VLDL: 14.8 mg/dL (ref 0.0–40.0)

## 2017-08-17 LAB — CBC WITH DIFFERENTIAL/PLATELET
BASOS ABS: 0 10*3/uL (ref 0.0–0.1)
BASOS PCT: 0.6 % (ref 0.0–3.0)
Eosinophils Absolute: 0.1 10*3/uL (ref 0.0–0.7)
Eosinophils Relative: 2.1 % (ref 0.0–5.0)
HEMATOCRIT: 45.6 % (ref 39.0–52.0)
HEMOGLOBIN: 15.3 g/dL (ref 13.0–17.0)
LYMPHS PCT: 19.3 % (ref 12.0–46.0)
Lymphs Abs: 1 10*3/uL (ref 0.7–4.0)
MCHC: 33.6 g/dL (ref 30.0–36.0)
MCV: 86.9 fl (ref 78.0–100.0)
Monocytes Absolute: 0.4 10*3/uL (ref 0.1–1.0)
Monocytes Relative: 7.7 % (ref 3.0–12.0)
NEUTROS ABS: 3.8 10*3/uL (ref 1.4–7.7)
Neutrophils Relative %: 70.3 % (ref 43.0–77.0)
PLATELETS: 227 10*3/uL (ref 150.0–400.0)
RBC: 5.24 Mil/uL (ref 4.22–5.81)
RDW: 13.6 % (ref 11.5–15.5)
WBC: 5.4 10*3/uL (ref 4.0–10.5)

## 2017-08-17 LAB — COMPREHENSIVE METABOLIC PANEL
ALBUMIN: 4.6 g/dL (ref 3.5–5.2)
ALK PHOS: 102 U/L (ref 39–117)
ALT: 18 U/L (ref 0–53)
AST: 19 U/L (ref 0–37)
BILIRUBIN TOTAL: 0.7 mg/dL (ref 0.2–1.2)
BUN: 17 mg/dL (ref 6–23)
CALCIUM: 9.8 mg/dL (ref 8.4–10.5)
CO2: 32 meq/L (ref 19–32)
CREATININE: 0.88 mg/dL (ref 0.40–1.50)
Chloride: 104 mEq/L (ref 96–112)
GFR: 95.41 mL/min (ref >60.00)
Glucose, Bld: 95 mg/dL (ref 70–99)
Potassium: 4.4 mEq/L (ref 3.5–5.1)
Sodium: 142 mEq/L (ref 135–145)
TOTAL PROTEIN: 7.1 g/dL (ref 6.0–8.3)

## 2017-08-17 LAB — PSA: PSA: 0.98 ng/mL (ref 0.10–4.00)

## 2017-08-17 MED ORDER — FLUTICASONE PROPIONATE 0.05 % EX CREA: TOPICAL_CREAM | Freq: Two times a day (BID) | CUTANEOUS | 5 refills | Status: DC

## 2017-08-17 MED ORDER — CALCIPOTRIENE 0.005 % EX SOLN: CUTANEOUS | 11 refills | Status: DC

## 2017-08-17 MED ORDER — CLOTRIMAZOLE-BETAMETHASONE 1-0.05 % EX CREA: 1.0000 "application " | TOPICAL_CREAM | Freq: Two times a day (BID) | CUTANEOUS | 2 refills | Status: DC

## 2017-08-17 NOTE — Patient Instructions (Signed)
Try zantac 150mg  twice a day on days you opt out of omeprazole (such as every other day trial

## 2017-08-17 NOTE — Assessment & Plan Note (Signed)
Syncope- thought vasovagal per cardiology. Heart monitor ordered but not completed by patient. He declined neuro referral as well- decilnes again today. No further episodes once he has focused more heavily on hydration. He agrees to do heart monitor and neurology if he has recurrence

## 2017-08-17 NOTE — Addendum Note (Signed)
Addended by: Kayren Eaves T on: 08/17/2017 11:01 AM   Modules accepted: Orders, SmartSet

## 2017-08-17 NOTE — Progress Notes (Signed)
Phone: [phone]  Subjective:  Patient presents today for their annual physical. Chief complaint-noted.   See problem oriented charting- ROS- full  review of systems was completed and negative except for: urinary urgency, frequency  The following were reviewed and entered/updated in epic: Past Medical History:  Diagnosis Date  . Allergy   . Central sleep apnea    AHI of 44 exacerbated to 52 and failed CPAP  . Colon cancer ([place])    polpy removed from colon that was cancerous  . Complex sleep apnea syndrome    baseline AHI of 44 on 04-21-11, changed to adapt SV    . Complication of anesthesia    Patient has hard time waking up  . GERD (gastroesophageal reflux disease)   . HEMORRHOIDS-INTERNAL 10/01/2007   Qualifier: Diagnosis of  By: Fuller Plan MD Marijo Conception High cholesterol   . PONV (postoperative nausea and vomiting)   . Psoriasis   . Right elbow pain 08/19/2015   Patient Active Problem List   Diagnosis Date Noted  . Psoriasis 02/22/2016    Priority: High  . Severe motion sickness 02/22/2016    Priority: High  . History of colon cancer 10/01/2007    Priority: High  . Syncope 06/14/2017    Priority: Medium  . Groin rash 02/22/2016    Priority: Medium  . Hyperhidrosis of palms and soles 02/22/2016    Priority: Medium  . Hyperlipidemia 09/05/2013    Priority: Medium  . GERD (gastroesophageal reflux disease) 09/05/2013    Priority: Medium  . Sleep apnea 09/28/2011    Priority: Medium  . Prolonged P-R interval 07/25/2017    Priority: Low  . Left hamstring muscle strain 12/08/2014    Priority: Low  . Acute sinus infection 01/01/2017   Past Surgical History:  Procedure Laterality Date  . COLONOSCOPY  2009   CANCEROUS POLYPS REMOVED  . KNEE ARTHROSCOPY Left 1979  . KNEE ARTHROSCOPY Right 08/22/2012   Procedure: RIGHT KNEE ARTHROSCOPY WITH DEBRIDEMENT ;  Surgeon: Marin Shutter, MD;  Location: [facility];  Service: Orthopedics;  Laterality: Right;  . WRIST FRACTURE  SURGERY Left ~ 1995    Family History  Problem Relation Age of Onset  . Sleep disorder Father   . Hyperlipidemia Father   . Sleep disorder Paternal Grandfather   . Heart disease Paternal Grandfather   . Cancer Paternal Grandmother        breast  . Breast cancer Mother   . Alcohol abuse Mother   . Eating disorder Mother   . Diabetes Maternal Grandmother   . Heart disease Maternal Grandfather        late life  . Hyperlipidemia Maternal Grandfather   . Stroke Maternal Grandfather        late life    Medications- reviewed and updated Current Outpatient Medications  Medication Sig Dispense Refill  . atorvastatin (LIPITOR) 40 MG tablet TAKE 1 TABLET BY MOUTH ONCE DAILY 90 tablet 3  . Calcipotriene 0.005 % solution Apply topically 2 times daily. 60 mL 11  . clotrimazole-betamethasone (LOTRISONE) cream Apply 1 application topically 2 (two) times daily. 30 g 2  . fluticasone (CUTIVATE) 0.05 % cream Apply topically 2 (two) times daily. 30 g 5  . omeprazole (PRILOSEC) 20 MG capsule Take 20 mg by mouth daily.    . ondansetron (ZOFRAN) 4 MG tablet Take 4 mg by mouth every 8 (eight) hours as needed for nausea or vomiting.     No current facility-administered medications for  this visit.     Allergies-reviewed and updated Allergies  Allergen Reactions  . Cortisone   . Depo-Medrol [Methylprednisolone Acetate] Other (See Comments)    Blurry vision- blisters behind retinas  . Codeine     REACTION: nausea, vomiting  . Other     Does/t likt narcotics and cannot take steroids     Social History   Social History Narrative   Married. 2 grown children- 18 (cosemetology school) and 22 ([employer] grad 2018) in 2018      BA at [employer] major. 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 Oropharynx normal Neck: no thyromegaly CV: RRR no murmurs rubs or gallops Lungs: CTAB no crackles, wheeze, rhonchi Abdomen: soft/nontender/nondistended/normal bowel sounds. No rebound or guarding.  Ext: no edema Skin: warm, dry, erythema at top of buttocks with some scaling. On glans of penis there is some mild erythema and scaling as well Neuro: grossly normal, moves all extremities, PERRLA Rectal: normal tone, normal sized prostate, no masses or tenderness   Assessment/Plan:  57 y.o. male presenting for annual physical.  Health Maintenance counseling: 1. Anticipatory guidance: Patient counseled regarding regular dental exams -q6 months, eye exams, progressive lenses - within a few months, wearing seatbelts.  2. Risk factor reduction:  Advised patient of need for regular exercise and diet rich and fruits and vegetables to reduce risk of heart attack and stroke. Exercise- not doing regularly- advised 150 mins a week, but remains active. Diet-reasonable diet- knows he could cut down on some fried foods but does get good fruits/veggies intake.  Wt Readings from Last 3 Encounters:  08/17/17 156 lb (70.8 kg)  07/25/17 153 lb 1.9 oz (69.5 kg)  06/14/17 152 lb 6.4 oz (69.1 kg)  3. Immunizations/screenings ancillary studies- advised fall flu shot Immunization History  Administered Date(s) Administered  . Influenza-Unspecified 09/21/2015, 10/25/2016  . Tdap 06/03/2006, 01/01/2017  4. Prostate cancer screening-  low risk PSA trend- get psa, ua, cbg. Urinary frequency, urgency- has increased fluid intake recently but thinks it started before that. No nocturia. Weaker stream.  Lab Results  Component Value Date   PSA 0.67 03/21/2016   PSA 0.76 03/10/2015   PSA 0.86 07/09/2013  5. Colon cancer history- 04/04/15  with 3 year repeat for colonoscopy 6. Skin cancer screening- has seen Dr. Allyson Sabal years ago. advised regular sunscreen use. Denies worrisome, changing, or new skin lesions.    Status of chronic or acute concerns   Hyperglycemia- will update cbg today. Ir remains high- get a1c next year  Psoriasis Calcipotriene helps with psoriasis in scalp. lotrisone helps with psoriasis on glas of penis, fluticasone cream helps with intermittent jock itch.   Severe motion sickness Vertigo is better from eyes but still has motion issues  Syncope Syncope- thought vasovagal per cardiology. Heart monitor ordered but not completed by patient. He declined neuro referral as well- decilnes again today. No further episodes once he has focused more heavily on hydration. He agrees to do heart monitor and neurology if he has recurrence  GERD (  gastroesophageal reflux disease) GERD- having good success with omeprazole. He didn't trial zantac every other day and continue omeprazole every other day. He didn't try this last year as intended  Hyperlipidemia HLD- on atorvastatin 40mg  and well controlled  Return in about 1 year (around 08/18/2018) for physical.  Lab/Order associations: Preventative health care - Plan: CBC with Differential/Platelet, Comprehensive metabolic panel, Lipid panel, PSA, HIV antibody  Psoriasis - Plan: fluticasone (CUTIVATE) 0.05 % cream  Urinary urgency - Plan: PSA  Screening for prostate cancer - Plan: PSA  Hyperlipidemia, unspecified hyperlipidemia type - Plan: CBC with Differential/Platelet, Comprehensive metabolic panel, Lipid panel  Screening for HIV (human immunodeficiency virus) - Plan: HIV antibody  Severe motion sickness, initial encounter  Syncope, unspecified syncope type  Gastroesophageal reflux disease without esophagitis  Return precautions advised.  Garret Reddish, MD

## 2017-08-17 NOTE — Assessment & Plan Note (Signed)
GERD- having good success with omeprazole. He didn't trial zantac every other day and continue omeprazole every other day. He didn't try this last year as intended

## 2017-08-17 NOTE — Assessment & Plan Note (Signed)
Vertigo is better from eyes but still has motion issues

## 2017-08-17 NOTE — Assessment & Plan Note (Signed)
HLD- on atorvastatin 40mg  and well controlled

## 2017-08-17 NOTE — Assessment & Plan Note (Signed)
Calcipotriene helps with psoriasis in scalp. lotrisone helps with psoriasis on glas of penis, fluticasone cream helps with intermittent jock itch.

## 2017-08-18 LAB — HIV ANTIBODY (ROUTINE TESTING W REFLEX): HIV 1&2 Ab, 4th Generation: NONREACTIVE

## 2017-08-28 DIAGNOSIS — M180 Bilateral primary osteoarthritis of first carpometacarpal joints: Secondary | ICD-10-CM | POA: Diagnosis not present

## 2017-08-28 DIAGNOSIS — M1811 Unilateral primary osteoarthritis of first carpometacarpal joint, right hand: Secondary | ICD-10-CM | POA: Diagnosis not present

## 2017-09-04 DIAGNOSIS — M180 Bilateral primary osteoarthritis of first carpometacarpal joints: Secondary | ICD-10-CM | POA: Diagnosis not present

## 2017-09-04 DIAGNOSIS — R600 Localized edema: Secondary | ICD-10-CM | POA: Diagnosis not present

## 2017-09-07 DIAGNOSIS — M180 Bilateral primary osteoarthritis of first carpometacarpal joints: Secondary | ICD-10-CM | POA: Diagnosis not present

## 2018-02-25 ENCOUNTER — Encounter: Payer: Self-pay | Admitting: Family Medicine

## 2018-02-25 ENCOUNTER — Ambulatory Visit: Payer: BLUE CROSS/BLUE SHIELD | Admitting: Family Medicine

## 2018-02-25 VITALS — BP 110/90 | HR 79 | Temp 98.5°F | Ht 65.0 in | Wt 157.2 lb

## 2018-02-25 DIAGNOSIS — K219 Gastro-esophageal reflux disease without esophagitis: Secondary | ICD-10-CM

## 2018-02-25 DIAGNOSIS — J329 Chronic sinusitis, unspecified: Secondary | ICD-10-CM

## 2018-02-25 DIAGNOSIS — B9789 Other viral agents as the cause of diseases classified elsewhere: Secondary | ICD-10-CM

## 2018-02-25 DIAGNOSIS — J069 Acute upper respiratory infection, unspecified: Secondary | ICD-10-CM | POA: Diagnosis not present

## 2018-02-25 MED ORDER — OMEPRAZOLE 40 MG PO CPDR: 40.0000 mg | DELAYED_RELEASE_CAPSULE | Freq: Every day | ORAL | 1 refills | Status: DC

## 2018-02-25 NOTE — Progress Notes (Signed)
PCP: Marin Olp, MD  Subjective:  Alejandro [name] is a 58 y.o. year old very pleasant male patient who presents with sinusitis symptoms including nasal congestion, sinus tenderness  Day 5 of sinus pressure and congestion as of today. He started taking amoxicillin every 6 hours 3 a day since Thursday. Sinus drainage vs. Congestion and unable to get discharge out. Also has a bad cough that tends to vary through day. Also feeling hot and clammy during this time frame. Notes chest congestion. Denies fever.   -Symptoms  -previous treatments: Sudafed in day, benadryl at night -sick contacts/travel/risks: does not report flu exposure.   ROS-denies fever (thoguh does feel hot or cold at times), SOB, nausea, vomiting, diarrhea  Pertinent Past Medical History-  Patient Active Problem List   Diagnosis Date Noted  . Psoriasis 02/22/2016    Priority: High  . Severe motion sickness 02/22/2016    Priority: High  . History of colon cancer 10/01/2007    Priority: High  . Syncope 06/14/2017    Priority: Medium  . Groin rash 02/22/2016    Priority: Medium  . Hyperhidrosis of palms and soles 02/22/2016    Priority: Medium  . Hyperlipidemia 09/05/2013    Priority: Medium  . GERD (gastroesophageal reflux disease) 09/05/2013    Priority: Medium  . Sleep apnea 09/28/2011    Priority: Medium  . Prolonged P-R interval 07/25/2017    Priority: Low  . Left hamstring muscle strain 12/08/2014    Priority: Low  . Acute sinus infection 01/01/2017    Medications- reviewed  Current Outpatient Medications  Medication Sig Dispense Refill  . atorvastatin (LIPITOR) 40 MG tablet TAKE 1 TABLET BY MOUTH ONCE DAILY 90 tablet 3  . Calcipotriene 0.005 % solution Apply topically 2 times daily. 60 mL 11  . clotrimazole-betamethasone (LOTRISONE) cream Apply 1 application topically 2 (two) times daily. 30 g 2  . fluticasone (CUTIVATE) 0.05 % cream Apply topically 2 (two) times daily. 30 g 5  . omeprazole  (PRILOSEC) 40 MG capsule Take 1 capsule (40 mg total) by mouth daily. 30 capsule 1  . ondansetron (ZOFRAN) 4 MG tablet Take 4 mg by mouth every 8 (eight) hours as needed for nausea or vomiting.     No current facility-administered medications for this visit.     Objective: BP 110/90 (BP Location: Right Arm, Patient Position: Sitting, Cuff Size: Large)   Pulse 79   Temp 98.5 F (36.9 C) (Oral)   Ht 5\' 5"  (1.651 m)   Wt 157 lb 3.2 oz (71.3 kg)   SpO2 95%   BMI 26.16 kg/m  Gen: NAD, resting comfortably HEENT: Turbinates erythematous with minimal drainage, TM normal, pharynx mildly erythematous with no tonsilar exudate or edema, bilateral maxillary sinus tenderness CV: RRR no murmurs rubs or gallops Lungs: CTAB no crackles, wheeze, rhonchi Ext: no edema Skin: warm, dry, no rash  Assessment/Plan:  Sinus infection/Sinusitis Viral based on <10 days, no double sickening, lack of severity of symptoms in first 3 days (no fever and thick green discharge from nose). Educated on signs that bacterial infection may have developed (symptoms over 10 days, double sickening).   Treatment: -symptomatic care with  Plain mucinex or mucinex- DM (if you want to have something to help with cough as well) Sinus rinses like a Neti Pot or Neilmed sinus rinse (make sure to follow instructions for water preparation) Tylenol 650 mg every 6 hours to help with sinus pressure -Antibiotic indicated:  Not at present- but we  agreed I would call something in if symptoms worsen after improving or last past 10 days - likely augmentin BID for 7 days   Finally, we reviewed reasons to return to care including if symptoms worsen or persist  (despite above treatments) or new concerns arise (particularly fever or shortness of breath)  Meds ordered this encounter  Medications  . omeprazole (PRILOSEC) 40 MG capsule    Sig: Take 1 capsule (40 mg total) by mouth daily.    Dispense:  30 capsule    Refill:  1    Garret Reddish, MD

## 2018-02-25 NOTE — Patient Instructions (Addendum)
You seem to be having a flareup of your acid reflux.  Lets try omeprazole 40 mg daily before breakfast or before dinner for 1 to 2 months.  If symptoms do not improve at all-may be worth seeing GI-certainly would consider this if symptoms worsen  Bottom blood pressure # hair high- stopping sudafed is a good idea  Sorry to hear your mother is not doing well- I would avoid contact until symptoms have significantly improved.  Sinus infection/Sinusitis Likely started with upper respiratory infection/common cold Viral based on <10 days, no double sickening, lack of severity of symptoms in first 3 days (no fever and thick green discharge from nose). Educated on signs that bacterial infection may have developed (symptoms over 10 days, double sickening).   Treatment: -symptomatic care with  Plain mucinex or mucinex- DM (if you want to have something to help with cough as well) Sinus rinses like a Neti Pot or Neilmed sinus rinse (make sure to follow instructions for water preparation) Tylenol 650 mg every 6 hours to help with sinus pressure -Antibiotic indicated:  Not at present- but we agreed I would call something in if symptoms worsen after improving or last past 10 days - likely augmentin BID for 7 days   Finally, we reviewed reasons to return to care including if symptoms worsen or persist  (despite above treatments) or new concerns arise (particularly fever or shortness of breath)  Meds ordered this encounter  Medications  . omeprazole (PRILOSEC) 40 MG capsule    Sig: Take 1 capsule (40 mg total) by mouth daily.    Dispense:  30 capsule    Refill:  1

## 2018-02-25 NOTE — Assessment & Plan Note (Signed)
S:For the past few weeks has been burping/belching a fair amount. Gets waves of nausea- similar to when he started omeprazole. Feels bloated. Gets some waves of hot/cold/nausea and like he needs to eat but eats and doesn't get better. Has increased coffee some and doing some more hershey kisses ut no abnormal amount.  A/P: You seem to be having a flareup of your acid reflux.  Lets try omeprazole 40 mg daily before breakfast or before dinner for 1 to 2 months.  If symptoms do not improve at all-may be worth seeing GI-certainly would consider this if symptoms worsen

## 2018-04-25 ENCOUNTER — Encounter: Payer: Self-pay | Admitting: Family Medicine

## 2018-04-25 ENCOUNTER — Ambulatory Visit (INDEPENDENT_AMBULATORY_CARE_PROVIDER_SITE_OTHER): Payer: BLUE CROSS/BLUE SHIELD | Admitting: Family Medicine

## 2018-04-25 ENCOUNTER — Ambulatory Visit: Payer: Self-pay | Admitting: Family Medicine

## 2018-04-25 DIAGNOSIS — H01005 Unspecified blepharitis left lower eyelid: Secondary | ICD-10-CM

## 2018-04-25 MED ORDER — BACITRACIN-POLYMYXIN B 500-10000 UNIT/GM OP OINT: 1.0000 "application " | TOPICAL_OINTMENT | Freq: Two times a day (BID) | OPHTHALMIC | 0 refills | Status: DC

## 2018-04-25 NOTE — Telephone Encounter (Signed)
Pt. Reports his left eye started swelling 2-3 days ago. Bottom lid is swollen.red, and itchy. Eye waters. No pus. No blurry vision. Spoke with [facility] in the practice and will send triage for review. Answer Assessment - Initial Assessment Questions 1. ONSET: "When did the swelling start?" (e.g., minutes, hours, days)     Started 2 days ago 2. LOCATION: "What part of the eyelids is swollen?"     Lower lid 3. SEVERITY: "How swollen is it?"     Lower lid puffy and red 4. ITCHING: "Is there any itching?" If so, ask: "How much?"   (Scale 1-10; mild, moderate or severe)     Yes 5. PAIN: "Is the swelling painful to touch?" If so, ask: "How painful is it?"   (Scale 1-10; mild, moderate or severe)     1 6. FEVER: "Do you have a fever?" If so, ask: "What is it, how was it measured, and when did it start?"      No 7. CAUSE: "What do you think is causing the swelling?"     Unsure 8. RECURRENT SYMPTOM: "Have you had eyelid swelling before?" If so, ask: "When was the last time?" "What happened that time?"     No 9. OTHER SYMPTOMS: "Do you have any other symptoms?" (e.g., blurred vision, eye discharge, rash, runny nose)     No 10. PREGNANCY: "Is there any chance you are pregnant?" "When was your last menstrual period?"       n/a  Protocols used: EYE - [facility]

## 2018-04-25 NOTE — Telephone Encounter (Signed)
Called pt to schedule, he already has an appointment today.

## 2018-04-25 NOTE — Telephone Encounter (Signed)
Please call to schedule appointment.

## 2018-04-25 NOTE — Progress Notes (Signed)
    Chief Complaint:  Alejandro [name] is a 58 y.o. male who presents today for a virtual office visit with a chief complaint of eyelid swelling.   Assessment/Plan:  Blepharitis No red flags.  No signs of preseptal or periorbital cellulitis.  Start topical Polysporin ointment.  Discussed reasons to return to care and seek emergent care.  Follow-up as needed.    Subjective:  HPI:  Eyelid swelling Started 4 days ago.  Located left lower eyelid.  Stable over that time.  Some itching.  Some watery discharge.  No vision changes.  No fevers or chills.  No specific treatments tried.  No obvious precipitating events.  No other obvious alleviating or aggravating factors.  ROS: Per HPI  PMH: He reports that he has never smoked. He has never used smokeless tobacco. He reports current alcohol use of about 3.0 - 4.0 standard drinks of alcohol per week. He reports that he does not use drugs.      Objective/Observations  Physical Exam: Gen: NAD, resting comfortably HEENT: Small amount of edema to left lower eyelid with minimal erythema. Conjunctiva normal. EOMI without pain.  Pulm: Normal work of breathing Neuro: Grossly normal, moves all extremities Psych: Normal affect and thought content  Virtual Visit via Video   I connected with Alejandro [name] on 04/25/18 at  2:00 PM EDT by a video enabled telemedicine application and verified that I am speaking with the correct person using two identifiers. I discussed the limitations of evaluation and management by telemedicine and the availability of in person appointments. The patient expressed understanding and agreed to proceed.   Patient location: Home Provider location: [place] participating in the virtual visit: Myself and Patient     Algis Greenhouse. Jerline Pain, MD 04/25/2018 2:01 PM

## 2018-05-28 ENCOUNTER — Other Ambulatory Visit: Payer: Self-pay | Admitting: Family Medicine

## 2018-05-30 DIAGNOSIS — H0102A Squamous blepharitis right eye, upper and lower eyelids: Secondary | ICD-10-CM | POA: Diagnosis not present

## 2018-05-30 DIAGNOSIS — H1045 Other chronic allergic conjunctivitis: Secondary | ICD-10-CM | POA: Diagnosis not present

## 2018-05-30 DIAGNOSIS — I69998 Other sequelae following unspecified cerebrovascular disease: Secondary | ICD-10-CM | POA: Diagnosis not present

## 2018-05-30 DIAGNOSIS — H11153 Pinguecula, bilateral: Secondary | ICD-10-CM | POA: Diagnosis not present

## 2018-05-31 ENCOUNTER — Ambulatory Visit (INDEPENDENT_AMBULATORY_CARE_PROVIDER_SITE_OTHER): Payer: BLUE CROSS/BLUE SHIELD | Admitting: Family Medicine

## 2018-05-31 ENCOUNTER — Encounter: Payer: Self-pay | Admitting: Family Medicine

## 2018-05-31 VITALS — Ht 65.0 in | Wt 149.0 lb

## 2018-05-31 DIAGNOSIS — K219 Gastro-esophageal reflux disease without esophagitis: Secondary | ICD-10-CM | POA: Diagnosis not present

## 2018-05-31 DIAGNOSIS — T753XXA Motion sickness, initial encounter: Secondary | ICD-10-CM | POA: Diagnosis not present

## 2018-05-31 MED ORDER — SCOPOLAMINE 1 MG/3DAYS TD PT72: 1.0000 | MEDICATED_PATCH | TRANSDERMAL | 0 refills | Status: DC | PRN

## 2018-05-31 NOTE — Assessment & Plan Note (Signed)
S: Patient with motion sickness since childhood-can reach the point of nausea and vomiting. Used to get out of cycle within a day but recently had worsening episode  Over the last week is having more frequent bouts of symptoms. 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 Went home and threw up. Next day tried to help again but had another episode and took him a full week to finally normalize. He went to the eye doctor to get eyes rechecked (glasses adjustment last year really helped him). They did not think he needed new glasses.   Has some old promethazine 12.5mg  for nausea- started taking that in middle of the week with mild help.  Symptoms have finally improved-90-95% better at present.  Wants long term option-would prefer a preventative Scopolamine on 4 cruisis- only got sick one Has not recently tried meclizine A/P: Long-term history of motion sickness with dizziness and nausea as well as vomiting -We will try scopolamine patch to see if that gives him any relief when he has flareups -Wants to try vestibular therapy-we are not sure if this will be beneficial but would like to try - Patient really would like to be on a medicine to help prevent these episodes-I did not see a lot of data about options for preventative medications for motion sickness- we ultimately decided to get ENT opinion on this-referral placed today

## 2018-05-31 NOTE — Assessment & Plan Note (Signed)
S: Patient thought omeprazole 40 mg was helping with GERD- took for 30 days and was better (period where he had flare up) but after another month despite not getting refill on 40mg  dose issue resolved as he went back to 20mg  dose.   With below motion sickness issues- bowels get looser and gets more belchy/gassy A/P: GERD overall has improved-potential mild symptoms with belching/gassy- trial omeprazole 20 mg twice a day for 2 weeks then go back to once daily

## 2018-05-31 NOTE — Progress Notes (Signed)
Phone [phone]   Subjective:  Virtual visit via Video note. Chief complaint: Chief Complaint  Patient presents with  . Gastroesophageal Reflux    This visit type was conducted due to national recommendations for restrictions regarding the COVID-19 Pandemic (e.g. social distancing).  This format is felt to be most appropriate for this patient at this time balancing risks to patient and risks to population by having him in for in person visit.  No physical exam was performed (except for noted visual exam or audio findings with Telehealth visits).    Our team/I connected with Alejandro [name] at  9:40 AM EDT by a video enabled telemedicine application (doxy.me or caregility through epic) and verified that I am speaking with the correct person using two identifiers.  Location patient: [place] Location provider: [facility], office Persons participating in the virtual visit:  patient  Our team/I discussed the limitations of evaluation and management by telemedicine and the availability of in person appointments. In light of current covid-19 pandemic, patient also understands that we are trying to protect them by minimizing in office contact if at all possible.  The patient expressed consent for telemedicine visit and agreed to proceed. Patient understands insurance will be billed.   ROS- nausea, loose stools. Describes dizziness. No chest pain or SOB reported. No palpitations reported.    Past Medical History-  Patient Active Problem List   Diagnosis Date Noted  . Psoriasis 02/22/2016    Priority: High  . Severe motion sickness 02/22/2016    Priority: High  . History of colon cancer 10/01/2007    Priority: High  . Syncope 06/14/2017    Priority: Medium  . Groin rash 02/22/2016    Priority: Medium  . Hyperhidrosis of palms and soles 02/22/2016    Priority: Medium  . Hyperlipidemia 09/05/2013    Priority: Medium  . GERD (gastroesophageal reflux disease) 09/05/2013   Priority: Medium  . Sleep apnea 09/28/2011    Priority: Medium  . Prolonged P-R interval 07/25/2017    Priority: Low  . Left hamstring muscle strain 12/08/2014    Priority: Low  . Acute sinus infection 01/01/2017    Medications- reviewed and updated Current Outpatient Medications  Medication Sig Dispense Refill  . atorvastatin (LIPITOR) 40 MG tablet Take 1 tablet by mouth once daily 90 tablet 0  . bacitracin-polymyxin b (POLYSPORIN) ophthalmic ointment Place 1 application into the left eye 2 (two) times daily. apply to eye every 12 hours while awake 3.5 g 0  . Calcipotriene 0.005 % solution Apply topically 2 times daily. 60 mL 11  . clotrimazole-betamethasone (LOTRISONE) cream Apply 1 application topically 2 (two) times daily. 30 g 2  . fluticasone (CUTIVATE) 0.05 % cream Apply topically 2 (two) times daily. 30 g 5  . omeprazole (PRILOSEC) 40 MG capsule Take 1 capsule (40 mg total) by mouth daily. 30 capsule 1  . ondansetron (ZOFRAN) 4 MG tablet Take 4 mg by mouth every 8 (eight) hours as needed for nausea or vomiting.    Marland Kitchen scopolamine (TRANSDERM-SCOP) 1 MG/3DAYS Place 1 patch (1.5 mg total) onto the skin every 3 (three) days as needed (For motion sickness). 10 patch 0   No current facility-administered medications for this visit.      Objective:  Ht 5\' 5"  (1.651 m)   Wt 149 lb (67.6 kg)   BMI 24.79 kg/m  self reported vitals Gen: NAD, resting comfortably Lungs: nonlabored, normal respiratory rate  Skin: appears dry, no obvious rash Walking around house without  difficulty    Assessment and Plan   #GERD S: Patient thought omeprazole 40 mg was helping with GERD- took for 30 days and was better (period where he had flare up) but after another month despite not getting refill on 40mg  dose issue resolved as he went back to 20mg  dose.   With below motion sickness issues- bowels get looser and gets more belchy/gassy A/P: GERD overall has improved-potential mild symptoms with  belching/gassy- trial omeprazole 20 mg twice a day for 2 weeks then go back to once daily  # severe motion sickness S: Patient with motion sickness since childhood-can reach the point of nausea and vomiting. Used to get out of cycle within a day but recently had worsening episode  Over the last week is having more frequent bouts of symptoms. 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 Went home and threw up. Next day tried to help again but had another episode and took him a full week to finally normalize. He went to the eye doctor to get eyes rechecked (glasses adjustment last year really helped him). They did not think he needed new glasses.   Has some old promethazine 12.5mg  for nausea- started taking that in middle of the week with mild help.  Symptoms have finally improved-90-95% better at present.  Wants long term option-would prefer a preventative Scopolamine on 4 cruisis- only got sick one Has not recently tried meclizine A/P: Long-term history of motion sickness with dizziness and nausea as well as vomiting -We will try scopolamine patch to see if that gives him any relief when he has flareups -Wants to try vestibular therapy-we are not sure if this will be beneficial but would like to try - Patient really would like to be on a medicine to help prevent these episodes-I did not see a lot of data about options for preventative medications for motion sickness- we ultimately decided to get ENT opinion on this-referral placed today  Other notes: 1. business slow with covid 19. Wife changing jobs- some uncertainty in the air 2.left knee pain when walking and felt like he couldn't put weight on it. Gone within an hour. Was fine by the next day. Last night left foot was painful. Didn't  get red or swollen.  Unclear triggers but likely symptoms have resolved-recommended evaluation of currently having pain  Lab/Order associations: Severe motion sickness, initial encounter - Plan: Ambulatory referral to ENT, Ambulatory referral to Physical Therapy  Meds ordered this encounter  Medications  . scopolamine (TRANSDERM-SCOP) 1 MG/3DAYS    Sig: Place 1 patch (1.5 mg total) onto the skin every 3 (three) days as needed (For motion sickness).    Dispense:  10 patch    Refill:  0    Time Stamp The duration of face-to-face time during this visit was greater than 25 minutes. Greater than 50% of this time was spent in counseling, explanation of diagnosis, planning of further management, and/or coordination of care including discussing options for motion sickness and further work-up, reviewing patient concerns about reflux and a second medication.     Return precautions advised.  Garret Reddish, MD

## 2018-05-31 NOTE — Patient Instructions (Signed)
Health Maintenance Due  Topic Date Due  . COLONOSCOPY  04/04/2018    Depression screen [facility] 2/9 02/25/2018 08/17/2017 01/01/2017  Decreased Interest 0 0 0  Down, Depressed, Hopeless 0 0 0  PHQ - 2 Score 0 0 0  Altered sleeping - - 0  Tired, decreased energy - - 0  Change in appetite - - 0  Feeling bad or failure about yourself  - - 0  Trouble concentrating - - 0  Moving slowly or fidgety/restless - - 0  Suicidal thoughts - - 0  PHQ-9 Score - - 0

## 2018-06-04 ENCOUNTER — Telehealth: Payer: Self-pay | Admitting: Family Medicine

## 2018-06-04 MED ORDER — MECLIZINE HCL 12.5 MG PO TABS: 12.5000 mg | ORAL_TABLET | Freq: Three times a day (TID) | ORAL | 1 refills | Status: DC | PRN

## 2018-06-04 NOTE — Telephone Encounter (Signed)
Per last ov notes  "Long-term history of motion sickness with dizziness and nausea as well as vomiting -We will try scopolamine patch to see if that gives him any relief when he has flareups -Wants to try vestibular therapy-we are not sure if this will be beneficial but would like to try - Patient really would like to be on a medicine to help prevent these episodes-I did not see a lot of data about options for preventative medications for motion sickness- we ultimately decided to get ENT opinion on this-referral placed today"  Scopolamine patches were called in. Tried to call pt to see if he had received patches or to see if they had not worked for him. Also called to see if someone reach put for ENT for appt. However, no answer left vm for pt to return phone call. Will try again later.

## 2018-06-04 NOTE — Telephone Encounter (Signed)
Spoke to pt and he was advised that a patch was called in for him 05/23/2017. Pt stated that he wanted to have both the patch and pill on hand. Pt was informed that per Dr.Hunter, pt does not need to have both meds. Pt was very upset and stated that he should have both in case one doesn't work while he is on vacation to have it on hand. Rx issue will be handled by Dr. Yong Channel.

## 2018-06-04 NOTE — Telephone Encounter (Signed)
Copied from [place] [id number]. Topic: General - Inquiry >> Jun 04, 2018  8:11 AM Richardo Priest, NT wrote: Reason for CRM: Patient called in stating a medication was left off from 5/29. Patient believes it is promethazine 12.5mg  for his nausea. Patient is wanting this done by today and is irate it has yet to be done. Please advise and send to [facility], [place] [address] [phone] (Phone) [phone] (Fax)

## 2018-06-04 NOTE — Telephone Encounter (Signed)
See note

## 2018-06-04 NOTE — Telephone Encounter (Signed)
Patient called to ask can this Rx be sent to his Pharmacy today please he is tired of being nauseous.

## 2018-06-06 NOTE — Telephone Encounter (Signed)
Noted  

## 2018-06-26 DIAGNOSIS — R42 Dizziness and giddiness: Secondary | ICD-10-CM | POA: Diagnosis not present

## 2018-06-26 DIAGNOSIS — H903 Sensorineural hearing loss, bilateral: Secondary | ICD-10-CM | POA: Diagnosis not present

## 2018-06-27 DIAGNOSIS — R42 Dizziness and giddiness: Secondary | ICD-10-CM | POA: Diagnosis not present

## 2018-07-03 DIAGNOSIS — R42 Dizziness and giddiness: Secondary | ICD-10-CM | POA: Diagnosis not present

## 2018-08-03 DIAGNOSIS — Z1159 Encounter for screening for other viral diseases: Secondary | ICD-10-CM | POA: Diagnosis not present

## 2018-08-03 DIAGNOSIS — Z01812 Encounter for preprocedural laboratory examination: Secondary | ICD-10-CM | POA: Diagnosis not present

## 2018-08-06 DIAGNOSIS — Z9889 Other specified postprocedural states: Secondary | ICD-10-CM | POA: Diagnosis not present

## 2018-08-06 DIAGNOSIS — K644 Residual hemorrhoidal skin tags: Secondary | ICD-10-CM | POA: Diagnosis not present

## 2018-08-06 DIAGNOSIS — K6289 Other specified diseases of anus and rectum: Secondary | ICD-10-CM | POA: Diagnosis not present

## 2018-08-06 DIAGNOSIS — Z85048 Personal history of other malignant neoplasm of rectum, rectosigmoid junction, and anus: Secondary | ICD-10-CM | POA: Diagnosis not present

## 2018-08-06 DIAGNOSIS — K219 Gastro-esophageal reflux disease without esophagitis: Secondary | ICD-10-CM | POA: Diagnosis not present

## 2018-08-06 DIAGNOSIS — Z79899 Other long term (current) drug therapy: Secondary | ICD-10-CM | POA: Diagnosis not present

## 2018-08-06 DIAGNOSIS — Z1211 Encounter for screening for malignant neoplasm of colon: Secondary | ICD-10-CM | POA: Diagnosis not present

## 2018-08-06 DIAGNOSIS — K648 Other hemorrhoids: Secondary | ICD-10-CM | POA: Diagnosis not present

## 2018-08-06 DIAGNOSIS — Z8719 Personal history of other diseases of the digestive system: Secondary | ICD-10-CM | POA: Diagnosis not present

## 2018-08-06 DIAGNOSIS — Z86010 Personal history of colonic polyps: Secondary | ICD-10-CM | POA: Diagnosis not present

## 2018-08-06 LAB — HM COLONOSCOPY

## 2018-08-07 ENCOUNTER — Encounter: Payer: Self-pay | Admitting: Family Medicine

## 2018-08-21 ENCOUNTER — Other Ambulatory Visit: Payer: Self-pay | Admitting: Family Medicine

## 2018-10-03 ENCOUNTER — Other Ambulatory Visit: Payer: Self-pay | Admitting: Family Medicine

## 2018-11-24 ENCOUNTER — Other Ambulatory Visit: Payer: Self-pay | Admitting: Family Medicine

## 2019-02-18 ENCOUNTER — Telehealth: Payer: Self-pay

## 2019-02-18 NOTE — Telephone Encounter (Signed)
Patient calling to see if he could  Have the shingles shot. Patient is not experiencing any issues just wanted to know how to go about having it done and if he was old enough to receive the vaccine.

## 2019-02-18 NOTE — Telephone Encounter (Signed)
Called patient he will check with insurance for coverage and will call office to make app for nurse visit if he would ike here.

## 2019-02-19 ENCOUNTER — Other Ambulatory Visit: Payer: Self-pay | Admitting: Family Medicine

## 2019-02-19 NOTE — Telephone Encounter (Signed)
Ok to refill 

## 2019-02-24 ENCOUNTER — Other Ambulatory Visit: Payer: Self-pay

## 2019-02-24 ENCOUNTER — Telehealth: Payer: Self-pay | Admitting: Family Medicine

## 2019-02-24 NOTE — Telephone Encounter (Signed)
Pt requesting referral to Dr. Darliss Cheney with [place] system to be seen for his vertigo. Fax is [phone]. Please advise.

## 2019-02-25 ENCOUNTER — Other Ambulatory Visit: Payer: Self-pay

## 2019-02-25 DIAGNOSIS — R42 Dizziness and giddiness: Secondary | ICD-10-CM

## 2019-02-25 NOTE — Telephone Encounter (Signed)
Referral to [facility] placed.

## 2019-02-26 ENCOUNTER — Other Ambulatory Visit: Payer: Self-pay | Admitting: Family Medicine

## 2019-02-26 ENCOUNTER — Other Ambulatory Visit: Payer: Self-pay

## 2019-02-26 DIAGNOSIS — R42 Dizziness and giddiness: Secondary | ICD-10-CM

## 2019-02-28 ENCOUNTER — Telehealth: Payer: Self-pay

## 2019-02-28 NOTE — Telephone Encounter (Signed)
Patient is requesting a new referral for [place] gastrologist Patient was told that the referral would need to included his symptoms. Patient states that he is experiencing acid reflux, nausea, dizziness, balance and motion issues. Patient aware that Dr.Hunter is out of the office today. Fax number to [employer] is [phone]

## 2019-02-28 NOTE — Telephone Encounter (Signed)
Please see message. °

## 2019-03-03 ENCOUNTER — Other Ambulatory Visit: Payer: Self-pay

## 2019-03-03 DIAGNOSIS — Z85038 Personal history of other malignant neoplasm of large intestine: Secondary | ICD-10-CM

## 2019-03-03 DIAGNOSIS — R110 Nausea: Secondary | ICD-10-CM

## 2019-03-03 DIAGNOSIS — R42 Dizziness and giddiness: Secondary | ICD-10-CM

## 2019-03-03 NOTE — Telephone Encounter (Signed)
Referral to [facility] placed.

## 2019-03-03 NOTE — Telephone Encounter (Signed)
You may refer-also list history of colon cancer as well as a diagnosis

## 2019-03-20 DIAGNOSIS — Z85038 Personal history of other malignant neoplasm of large intestine: Secondary | ICD-10-CM | POA: Diagnosis not present

## 2019-04-15 DIAGNOSIS — R110 Nausea: Secondary | ICD-10-CM | POA: Diagnosis not present

## 2019-04-15 DIAGNOSIS — H9042 Sensorineural hearing loss, unilateral, left ear, with unrestricted hearing on the contralateral side: Secondary | ICD-10-CM | POA: Diagnosis not present

## 2019-04-23 DIAGNOSIS — Z87898 Personal history of other specified conditions: Secondary | ICD-10-CM | POA: Diagnosis not present

## 2019-04-23 DIAGNOSIS — R110 Nausea: Secondary | ICD-10-CM | POA: Diagnosis not present

## 2019-05-14 DIAGNOSIS — M545 Low back pain: Secondary | ICD-10-CM | POA: Diagnosis not present

## 2019-05-14 DIAGNOSIS — M5136 Other intervertebral disc degeneration, lumbar region: Secondary | ICD-10-CM | POA: Diagnosis not present

## 2019-05-26 ENCOUNTER — Other Ambulatory Visit: Payer: Self-pay | Admitting: Family Medicine

## 2019-05-27 ENCOUNTER — Other Ambulatory Visit: Payer: Self-pay

## 2019-05-27 DIAGNOSIS — L409 Psoriasis, unspecified: Secondary | ICD-10-CM

## 2019-05-27 MED ORDER — FLUTICASONE PROPIONATE 0.05 % EX CREA: TOPICAL_CREAM | Freq: Two times a day (BID) | CUTANEOUS | 5 refills | Status: DC

## 2019-05-29 ENCOUNTER — Other Ambulatory Visit: Payer: Self-pay | Admitting: Family Medicine

## 2019-06-04 DIAGNOSIS — H35363 Drusen (degenerative) of macula, bilateral: Secondary | ICD-10-CM | POA: Diagnosis not present

## 2019-06-04 DIAGNOSIS — H35713 Central serous chorioretinopathy, bilateral: Secondary | ICD-10-CM | POA: Diagnosis not present

## 2019-06-04 DIAGNOSIS — H2513 Age-related nuclear cataract, bilateral: Secondary | ICD-10-CM | POA: Diagnosis not present

## 2019-07-24 ENCOUNTER — Telehealth (INDEPENDENT_AMBULATORY_CARE_PROVIDER_SITE_OTHER): Payer: BC Managed Care – PPO | Admitting: Family Medicine

## 2019-07-24 ENCOUNTER — Telehealth: Payer: BLUE CROSS/BLUE SHIELD | Admitting: Family Medicine

## 2019-07-24 ENCOUNTER — Encounter: Payer: Self-pay | Admitting: Family Medicine

## 2019-07-24 DIAGNOSIS — G479 Sleep disorder, unspecified: Secondary | ICD-10-CM

## 2019-07-24 MED ORDER — TRAZODONE HCL 50 MG PO TABS: 25.0000 mg | ORAL_TABLET | Freq: Every evening | ORAL | 0 refills | Status: DC | PRN

## 2019-07-24 NOTE — Progress Notes (Signed)
Virtual Visit via Video Note  I connected with Alejandro [name]  on 07/24/19 at 10:00 AM EDT by a video enabled telemedicine application and verified that I am speaking with the correct person using two identifiers.  Location patient: home, [place] Location provider:work or home office Persons participating in the virtual visit: patient, provider  I discussed the limitations of evaluation and management by telemedicine and the availability of in person appointments. The patient expressed understanding and agreed to proceed.   HPI:  Acute visit for sleep issues: -started last year after his mother passed away -he has issues with sleep in 2004 and took xanax at night which helped, trazodone also helped with sleep in the past, but did not feel well if he took it during the day -has difficulty initiating sleep - feels like he is tossing and turning a lot -denies anxiety or depression -does have some stress, child with some issues - but does not feel like it is much -gets regular exercise, no caffeine, rare alcohol -he has sleep apnea, he prefers to avoid heavy duty sleeping pills or medications that suppress the resp system   ROS: See pertinent positives and negatives per HPI.  Past Medical History:  Diagnosis Date   Allergy    Central sleep apnea    AHI of 44 exacerbated to 52 and failed CPAP   Colon cancer (HCC)    polpy removed from colon that was cancerous   Complex sleep apnea syndrome    baseline AHI of 44 on 04-21-11, changed to adapt SV     Complication of anesthesia    Patient has hard time waking up   GERD (gastroesophageal reflux disease)    HEMORRHOIDS-INTERNAL 10/01/2007   Qualifier: Diagnosis of  By: Fuller Plan MD Marijo Conception    High cholesterol    PONV (postoperative nausea and vomiting)    Psoriasis    Right elbow pain 08/19/2015    Past Surgical History:  Procedure Laterality Date   COLONOSCOPY  2009   CANCEROUS POLYPS REMOVED   KNEE ARTHROSCOPY Left 1979    KNEE ARTHROSCOPY Right 08/22/2012   Procedure: RIGHT KNEE ARTHROSCOPY WITH DEBRIDEMENT ;  Surgeon: Marin Shutter, MD;  Location: [facility];  Service: Orthopedics;  Laterality: Right;   WRIST FRACTURE SURGERY Left ~ 1995    Family History  Problem Relation Age of Onset   Sleep disorder Father    Hyperlipidemia Father    Sleep disorder Paternal Grandfather    Heart disease Paternal Grandfather    Cancer Paternal Grandmother        breast   Breast cancer Mother    Alcohol abuse Mother    Eating disorder Mother    Diabetes Maternal Grandmother    Heart disease Maternal Grandfather        late life   Hyperlipidemia Maternal Grandfather    Stroke Maternal Grandfather        late life    SOCIAL HX: see hpi   Current Outpatient Medications:    atorvastatin (LIPITOR) 40 MG tablet, Take 1 tablet by mouth once daily, Disp: 90 tablet, Rfl: 0   Calcipotriene 0.005 % solution, APPLY  SOLUTION TOPICALLY TWICE DAILY, Disp: 60 mL, Rfl: 0   clotrimazole-betamethasone (LOTRISONE) cream, APPLY 1 APPLICATION TOPICALLY 2 TIMES DAILY, Disp: 30 g, Rfl: 0   fluticasone (CUTIVATE) 0.05 % cream, Apply topically 2 (two) times daily., Disp: 30 g, Rfl: 5   meclizine (ANTIVERT) 12.5 MG tablet, Take 1 tablet (12.5 mg total) by mouth  3 (three) times daily as needed for dizziness (or motion sickness). Do not take  with scopolamine, Disp: 30 tablet, Rfl: 1   omeprazole (PRILOSEC) 40 MG capsule, Take 1 capsule (40 mg total) by mouth daily., Disp: 30 capsule, Rfl: 1   ondansetron (ZOFRAN) 4 MG tablet, Take 4 mg by mouth every 8 (eight) hours as needed for nausea or vomiting., Disp: , Rfl:    scopolamine (TRANSDERM-SCOP) 1 MG/3DAYS, Place 1 patch (1.5 mg total) onto the skin every 3 (three) days as needed (For motion sickness)., Disp: 10 patch, Rfl: 0   traZODone (DESYREL) 50 MG tablet, Take 0.5-1 tablets (25-50 mg total) by mouth at bedtime as needed for sleep., Disp: 30 tablet, Rfl:  0  EXAM:  VITALS per patient if applicable:  GENERAL: alert, oriented, appears well and in no acute distress  HEENT: atraumatic, conjunttiva clear, no obvious abnormalities on inspection of external nose and ears  NECK: normal movements of the head and neck  LUNGS: on inspection no signs of respiratory distress, breathing rate appears normal, no obvious gross SOB, gasping or wheezing  CV: no obvious cyanosis  MS: moves all visible extremities without noticeable abnormality  PSYCH/NEURO: pleasant and cooperative, no obvious depression or anxiety, speech and thought processing grossly intact  ASSESSMENT AND PLAN:  Discussed the following assessment and plan:  Sleep disturbance  -we discussed possible serious and likely etiologies, options for evaluation and workup, limitations of telemedicine visit vs in person visit, treatment, treatment risks and precautions. Pt prefers to treat via telemedicine empirically rather then risking or undertaking an in person visit at this moment. Advised sleep hygiene, CBT and discussed various OTC and prescription medications for sleep and risks.  HE opted to try sleep hygiene changes, consider CBT and trial of trazodone. Advised follow up with PCP in a few weeks. He prefers to call for that appointment if needed.   I discussed the assessment and treatment plan with the patient. The patient was provided an opportunity to ask questions and all were answered. The patient agreed with the plan and demonstrated an understanding of the instructions.   The patient was advised to call back or seek an in-person evaluation if the symptoms worsen or if the condition fails to improve as anticipated.   Lucretia Kern, DO

## 2019-07-24 NOTE — Patient Instructions (Addendum)
FOR IMPROVED SLEEP AND TO RESET YOUR SLEEP SCHEDULE:  []  Schedule sleep counseling(cognitive behavioral therapy).   [facility] is a good option.   Call for appointment: [phone]  []  Go to bed and wake up at the same time everyday.  []  Keep bedroom cool, dark and quiet  []  Reserve bed for sleep - do not read, watch TV, look at phone or device, etc., in bed.  []  If you toss and turn for more then 10-15 minutes, get out of bed and list or journal thoughts or do quiet activity (not screen-time, no tv, phone, computer) then go back to bed. Repeat as needed. Try not to worry about when you will eventually fall asleep.  []  Some people find that a half dose of benadryl, melatonin, tylenol pm or unisom on a few nights per week is helpful initially for a few weeks.  []  Seek help for any depression or anxiety.  []  Exercise 30 minutes daily. Avoid caffeine and alcohol - particularly in the evenings.  [] Prescription strength sleep medications should only be used in severe cases of insomnia if other measures fail and should be used sparingly.  I hope you are feeling better soon! Follow up with your doctor in 3-4 weeks or sooner if your symptoms worsen or new concerns arise.

## 2019-08-07 DIAGNOSIS — H52223 Regular astigmatism, bilateral: Secondary | ICD-10-CM | POA: Diagnosis not present

## 2019-08-07 DIAGNOSIS — H5203 Hypermetropia, bilateral: Secondary | ICD-10-CM | POA: Diagnosis not present

## 2019-08-07 DIAGNOSIS — H524 Presbyopia: Secondary | ICD-10-CM | POA: Diagnosis not present

## 2019-09-07 ENCOUNTER — Other Ambulatory Visit: Payer: Self-pay | Admitting: Family Medicine

## 2019-09-22 ENCOUNTER — Other Ambulatory Visit: Payer: Self-pay | Admitting: Family Medicine

## 2019-10-20 NOTE — Patient Instructions (Addendum)
Please stop by lab before you go If you have mychart- we will send your results within 3 business days of [place] receiving them.  If you do not have mychart- we will call you about results within 5 business days of [place] receiving them.  *please note we are currently using [facility] which has a longer processing time than [place] typically so labs may not come back as quickly as in the past *please also note that you will see labs on mychart as soon as they post. I will later go in and write notes on them- will say "notes from Dr. Yong Channel"  Recommend trial of align or florastor for loose stools- let us know if not improving within 2 months  Recommend double voiding  Trial off omeprazole- try pepcid 20mg  twice daily for a month- if this works step down to 10mg  twice daily   We will call you within two weeks about your referral to dermatology. If you do not hear within 3 weeks, give [place] a call.

## 2019-10-20 NOTE — Progress Notes (Signed)
Phone: [phone]   Subjective:  Patient presents today for their annual physical. Chief complaint-noted.   See problem oriented charting- ROS- full  review of systems was completed and negative  except for: fatigue, visual problems, anal bleeding, diarrhea, nausea increased urination, difficulty urinating, urinary urgency, back pain, neck pain, sleep disturbance  The following were reviewed and entered/updated in epic: Past Medical History:  Diagnosis Date  . Allergy   . Central sleep apnea    AHI of 44 exacerbated to 52 and failed CPAP  . Colon cancer ([place])    polpy removed from colon that was cancerous  . Complex sleep apnea syndrome    baseline AHI of 44 on 04-21-11, changed to adapt SV    . Complication of anesthesia    Patient has hard time waking up  . GERD (gastroesophageal reflux disease)   . HEMORRHOIDS-INTERNAL 10/01/2007   Qualifier: Diagnosis of  By: Fuller Plan MD Marijo Conception High cholesterol   . PONV (postoperative nausea and vomiting)   . Psoriasis   . Right elbow pain 08/19/2015   Patient Active Problem List   Diagnosis Date Noted  . Psoriasis 02/22/2016    Priority: High  . Severe motion sickness 02/22/2016    Priority: High  . History of colon cancer 10/01/2007    Priority: High  . Syncope 06/14/2017    Priority: Medium  . Groin rash 02/22/2016    Priority: Medium  . Hyperhidrosis of palms and soles 02/22/2016    Priority: Medium  . Hyperlipidemia 09/05/2013    Priority: Medium  . GERD (gastroesophageal reflux disease) 09/05/2013    Priority: Medium  . Sleep apnea 09/28/2011    Priority: Medium  . Prolonged P-R interval 07/25/2017    Priority: Low  . Left hamstring muscle strain 12/08/2014    Priority: Low  . DDD (degenerative disc disease), lumbar 10/21/2019  . Acute sinus infection 01/01/2017   Past Surgical History:  Procedure Laterality Date  . COLONOSCOPY  2009   CANCEROUS POLYPS REMOVED  . KNEE ARTHROSCOPY Left 1979  . KNEE  ARTHROSCOPY Right 08/22/2012   Procedure: RIGHT KNEE ARTHROSCOPY WITH DEBRIDEMENT ;  Surgeon: Marin Shutter, MD;  Location: [facility];  Service: Orthopedics;  Laterality: Right;  . WRIST FRACTURE SURGERY Left ~ 1995    Family History  Problem Relation Age of Onset  . Sleep disorder Father   . Hyperlipidemia Father   . Sleep disorder Paternal Grandfather   . Heart disease Paternal Grandfather   . Cancer Paternal Grandmother        breast  . Breast cancer Mother   . Alcohol abuse Mother   . Eating disorder Mother   . Diabetes Maternal Grandmother   . Heart disease Maternal Grandfather        late life  . Hyperlipidemia Maternal Grandfather   . Stroke Maternal Grandfather        late life    Medications- reviewed and updated Current Outpatient Medications  Medication Sig Dispense Refill  . atorvastatin (LIPITOR) 40 MG tablet Take 1 tablet by mouth once daily 90 tablet 0  . Calcipotriene 0.005 % solution APPLY  SOLUTION TOPICALLY TWICE DAILY 60 mL 0  . clotrimazole-betamethasone (LOTRISONE) cream APPLY 1 APPLICATION TOPICALLY Alejandro [name] TIMES DAILY 30 g 0  . fluticasone (CUTIVATE) 0.05 % cream Apply topically Alejandro [name] (two) times daily. 30 g 5  . meclizine (ANTIVERT) 12.5 MG tablet Take 1 tablet (12.5 mg total) by mouth 3 (three) times daily as  needed for dizziness (or motion sickness). Do not take  with scopolamine 30 tablet 1  . omeprazole (PRILOSEC) 40 MG capsule Take 1 capsule (40 mg total) by mouth daily. 30 capsule 1  . ondansetron (ZOFRAN) 4 MG tablet Take 4 mg by mouth every 8 (eight) hours as needed for nausea or vomiting.    Marland Kitchen scopolamine (TRANSDERM-SCOP) 1 MG/3DAYS Place 1 patch (1.5 mg total) onto the skin every 3 (three) days as needed (For motion sickness). 10 patch 0  . traZODone (DESYREL) 50 MG tablet Take 0.5-1 tablets (25-50 mg total) by mouth at bedtime as needed for sleep. (Patient not taking: Reported on 10/21/2019) 30 tablet 0   No current facility-administered medications for this  visit.    Allergies-reviewed and updated Allergies  Allergen Reactions  . Cortisone   . Depo-Medrol [Methylprednisolone Sodium Succ] Other (See Comments)    Blurry vision- blisters behind retinas  . Other Other (See Comments)    Does/t likt narcotics and cannot take steroids  Blurry vision- blisters behind retinas   . Codeine Other (See Comments)    REACTION: nausea, vomiting ABD PAIN REACTION: nausea, vomiting    Social History   Social History Narrative   Married. 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 Oropharynx normal Neck: no thyromegaly CV: RRR no murmurs rubs or gallops Lungs: CTAB no crackles, wheeze, rhonchi Abdomen: soft/nontender/nondistended/normal bowel sounds. No rebound or guarding.  Ext: no edema Skin: warm, dry, <1 x 1 cm area on right posterior leg-  Neuro: grossly normal, moves all extremities, PERRLA Past rectal exam low risk   Assessment and Plan  59 y.o. [name] presenting for annual physical.  Health Maintenance counseling: 1. Anticipatory guidance: Patient counseled regarding regular dental exams q6 months, eye exams yearly,  avoiding smoking and second hand smoke, limiting alcohol to Alejandro [name] beverages per day.   Alejandro [name]. Risk factor reduction:  Advised patient of need for regular exercise and diet rich and fruits and vegetables to reduce risk of heart attack and stroke. Exercise- tennis once a week and golfs once a week- active at home. Diet- slight weight gain- 157 to 161 compared to last in office scales-  discussed improving diet.  Wt Readings from Last 3 Encounters:  10/21/19 161 lb 12.8 oz (73.4 kg)  05/31/18 149 lb (67.6 kg)  02/25/18 157 lb 3.Alejandro [name] oz (71.3 kg)  3. Immunizations/screenings/ancillary studies-discussed candidate for COVID-19 booster with BMI over 25  Immunization History  Administered Date(s) Administered  . Influenza, Quadrivalent, Recombinant, Inj, Pf 09/05/2017, 10/05/2018  . Influenza-Unspecified 09/21/2015, 10/25/2016, 09/05/2017, 10/05/2018, 09/24/2019  . PFIZER SARS-COV-Alejandro [name] Vaccination 03/20/2019, 04/10/2019  . Tdap 06/03/2006, 01/01/2017  . Zoster Recombinat (Shingrix) 02/18/2019, 06/03/2019   4. Prostate cancer screening- low risk PSA trend. Continues have some urinary frequency and urgency (really bothers him) as well as weaker stream and dribbling afterwards. Discussed finasteride and tamsulosin- opts out for now- did discuss double voiding  trial. Symptoms largely stable Lab Results  Component Value Date   PSA 0.98 08/17/2017   PSA 0.67 03/21/2016   PSA 0.76 03/10/2015   5. Colon cancer screening - August 06, 2018 with 3-year repeat planned.  Reports loose stools largely unformed but stomach upset x3 or so before 10 AM. Has tried raisin bran. Every once in a while has blood in stool- old anal fissure- no progression of blood in stool. Would recommend month Alejandro [name] month trial of align or florastor probiotic.  6. Skin cancer screening-has seen Dr. Allyson Sabal in the past but not recently. advised regular sunscreen use. Denies worrisome, changing, or new skin lesions.  7. Never smoker 8. STD screening - monogamous- opts out  Status of chronic or acute concerns   # hard spot on back of right thigh-  Possible cyst- will refer to [facility] derm for full skin exam and they can evaluate this  #Severe motion sickness (hypersensitive vestibular system)-continues to have severe issues with this. Has had issues since childhood. Can cause nausea and vomiting. Visit last May patient was having  more frequent bouts of symptoms. Eye doctor last year did not think he needed new prescription-he was having issues even if he did controlled movements. Was doing better on promethazine last visit. He preferred a long-term preventative. Thinks it contributes to his GI symptoms. Feels like symptoms are worsening over time.  -We tried scopolamine patch- no help -Referred to vestibular therapy- this was not helpful -Ultimately referred to ear nose and throat-he saw Dr. Amado Coe with [facility]- recommended PT and habituation exercises-they thought nausea, vomiting, burping or vagal responses to motion intolerance stimulus. Whole day of vertigo testing and was told he didn't have vertigo symptoms- which he told him he did not have.  - I do not have a great solution at this point  #Insomnia-had a virtual visit with Dr. Rogelia Boga was sleep hygiene changes, CBT, trial of trazodone. He states did not improve that much on trazodone. Still not able to use his CPAP machine- has not tried new masks lately. Something on his face keeps him up. He would fall asleep on trazodone and then within an hor or two he would take mask off. He doesn't feel like he falls into a deep sleep- remembers thoughts through most of the night, waking up a lot. He took xanax for a few months when friend committed suicide. He is not interested in CBT. He is trying to avoid screen time at bed.  - will refer back to Dr. Brett Fairy who originally diagnosed him for her expert insight.   #hyperlipidemia S: Medication:atorvastatin 40Mg  Lab Results  Component Value Date   CHOL 142 08/17/2017   HDL 58.00 08/17/2017   LDLCALC 69 08/17/2017   TRIG 74.0 08/17/2017   CHOLHDL Alejandro [name] 08/17/2017   A/P: Last LDL looked excellent-update lipids and likely continue current medications  # GERD S:Medication: Prilosec 20Mg  daily in AM- burping and gassy if he comes off med.  B12 levels related to PPI use: No results found for: VITAMINB12 A/P: stable  control. Trial off omeprazole- try pepcid 20mg  twice daily for a month- if this works step down to 10mg  twice daily    #Hyperglycemia-blood sugar fasting was normal last year-discussed coming back for fasting labs to recheck- he wants to do labs and we can consider fasting next year if levels off   #Psoriasis-calcipotriene helps with psoriasis in his scalp. Lotrisone helps when he has issues on the penis or jock itch- or has  fluticasone cream  # low back pain- had MRI and was told had DDD by Dr. Tonita Cong. He is still able to play sports but at lower level. inflaming the area more easily with regular activities. Trying to do stretches for his hamstring but worries he is hurting his low back.  -sports med advisor handout given -using aleve or ibuprofen- discussed minimizing use  Recommended follow up: Return in about 1 year (around 10/20/2020) for physical or sooner if needed.  Lab/Order associations:Non fasting   ICD-10-CM   1. Preventative health care  S34.19 COMPLETE METABOLIC PANEL WITH GFR    Lipid panel    CBC with Differential/Platelet    PSA  Alejandro [name]. Gastroesophageal reflux disease without esophagitis  K21.9   3. Hyperlipidemia, unspecified hyperlipidemia type  Q22.Alejandro [name] COMPLETE METABOLIC PANEL WITH GFR    Lipid panel    CBC with Differential/Platelet  4. Screening for prostate cancer  Z12.5 PSA  5. High risk medication use  Z79.899   6. Sleep apnea, unspecified type  G47.30   7. Insomnia, unspecified type  G47.00   8. Severe motion sickness, initial encounter  T75.3XXA   9. DDD (degenerative disc disease), lumbar  M51.36     No orders of the defined types were placed in this encounter.   Return precautions advised.  Garret Reddish, MD

## 2019-10-21 ENCOUNTER — Other Ambulatory Visit: Payer: Self-pay

## 2019-10-21 ENCOUNTER — Encounter: Payer: Self-pay | Admitting: Family Medicine

## 2019-10-21 ENCOUNTER — Ambulatory Visit (INDEPENDENT_AMBULATORY_CARE_PROVIDER_SITE_OTHER): Payer: BC Managed Care – PPO | Admitting: Family Medicine

## 2019-10-21 VITALS — BP 118/68 | HR 75 | Temp 98.2°F | Resp 18 | Ht 65.0 in | Wt 161.8 lb

## 2019-10-21 DIAGNOSIS — G4730 Sleep apnea, unspecified: Secondary | ICD-10-CM

## 2019-10-21 DIAGNOSIS — T753XXA Motion sickness, initial encounter: Secondary | ICD-10-CM

## 2019-10-21 DIAGNOSIS — G4700 Insomnia, unspecified: Secondary | ICD-10-CM

## 2019-10-21 DIAGNOSIS — Z125 Encounter for screening for malignant neoplasm of prostate: Secondary | ICD-10-CM

## 2019-10-21 DIAGNOSIS — Z1283 Encounter for screening for malignant neoplasm of skin: Secondary | ICD-10-CM

## 2019-10-21 DIAGNOSIS — Z0000 Encounter for general adult medical examination without abnormal findings: Secondary | ICD-10-CM

## 2019-10-21 DIAGNOSIS — Z79899 Other long term (current) drug therapy: Secondary | ICD-10-CM

## 2019-10-21 DIAGNOSIS — K219 Gastro-esophageal reflux disease without esophagitis: Secondary | ICD-10-CM | POA: Diagnosis not present

## 2019-10-21 DIAGNOSIS — E785 Hyperlipidemia, unspecified: Secondary | ICD-10-CM | POA: Diagnosis not present

## 2019-10-21 DIAGNOSIS — M5136 Other intervertebral disc degeneration, lumbar region: Secondary | ICD-10-CM | POA: Insufficient documentation

## 2019-10-21 MED ORDER — FAMOTIDINE 20 MG PO TABS: 20.0000 mg | ORAL_TABLET | Freq: Two times a day (BID) | ORAL | 5 refills | Status: DC

## 2019-10-22 LAB — CBC WITH DIFFERENTIAL/PLATELET
Absolute Monocytes: 543 cells/uL (ref 200–950)
Basophils Absolute: 30 cells/uL (ref 0–200)
Basophils Relative: 0.5 %
Eosinophils Absolute: 148 cells/uL (ref 15–500)
Eosinophils Relative: 2.5 %
HCT: 45.6 % (ref 38.5–50.0)
Hemoglobin: 15.2 g/dL (ref 13.2–17.1)
Lymphs Abs: 938 cells/uL (ref 850–3900)
MCH: 28.8 pg (ref 27.0–33.0)
MCHC: 33.3 g/dL (ref 32.0–36.0)
MCV: 86.4 fL (ref 80.0–100.0)
MPV: 10.8 fL (ref 7.5–12.5)
Monocytes Relative: 9.2 %
Neutro Abs: 4242 cells/uL (ref 1500–7800)
Neutrophils Relative %: 71.9 %
Platelets: 230 10*3/uL (ref 140–400)
RBC: 5.28 10*6/uL (ref 4.20–5.80)
RDW: 12.2 % (ref 11.0–15.0)
Total Lymphocyte: 15.9 %
WBC: 5.9 10*3/uL (ref 3.8–10.8)

## 2019-10-22 LAB — COMPLETE METABOLIC PANEL WITH GFR
AG Ratio: 2.4 (calc) (ref 1.0–2.5)
ALT: 25 U/L (ref 9–46)
AST: 19 U/L (ref 10–35)
Albumin: 4.7 g/dL (ref 3.6–5.1)
Alkaline phosphatase (APISO): 123 U/L (ref 35–144)
BUN: 23 mg/dL (ref 7–25)
CO2: 31 mmol/L (ref 20–32)
Calcium: 9.6 mg/dL (ref 8.6–10.3)
Chloride: 104 mmol/L (ref 98–110)
Creat: 0.82 mg/dL (ref 0.70–1.33)
GFR, Est African American: 114 mL/min/{1.73_m2} (ref >=60)
GFR, Est Non African American: 98 mL/min/{1.73_m2} (ref >=60)
Globulin: 2 g/dL (calc) (ref 1.9–3.7)
Glucose, Bld: 88 mg/dL (ref 65–99)
Potassium: 4.5 mmol/L (ref 3.5–5.3)
Sodium: 141 mmol/L (ref 135–146)
Total Bilirubin: 0.4 mg/dL (ref 0.2–1.2)
Total Protein: 6.7 g/dL (ref 6.1–8.1)

## 2019-10-22 LAB — LIPID PANEL
Cholesterol: 154 mg/dL (ref <200)
HDL: 56 mg/dL (ref >=40)
LDL Cholesterol (Calc): 84 mg/dL (calc)
Non-HDL Cholesterol (Calc): 98 mg/dL (calc) (ref <130)
Total CHOL/HDL Ratio: 2.8 (calc) (ref <5.0)
Triglycerides: 67 mg/dL (ref <150)

## 2019-10-22 LAB — VITAMIN B12: Vitamin B-12: 446 pg/mL (ref 200–1100)

## 2019-10-22 LAB — PSA: PSA: 0.73 ng/mL (ref <=4.0)

## 2019-10-27 ENCOUNTER — Telehealth: Payer: Self-pay

## 2019-10-27 NOTE — Telephone Encounter (Signed)
Received a voicemail from this patient wanting to schedule an appointment. They can be reached at [phone].

## 2019-11-03 ENCOUNTER — Telehealth: Payer: Self-pay

## 2019-11-03 NOTE — Telephone Encounter (Signed)
Patient is calling in stating he was prescribed famotidine (PEPCID) 20 MG tablet, wanted to know how long he is supposed to be on the medication before he is supposed to notice a change in the indigestion.

## 2019-11-03 NOTE — Telephone Encounter (Signed)
In your notes you stated "try pepcid 20mg  twice daily for a month- if this works step down to 10mg  twice daily " . Will it take a month before he can tell a difference?

## 2019-11-04 NOTE — Telephone Encounter (Signed)
Called and lm for pt tcb. 

## 2019-11-04 NOTE — Telephone Encounter (Signed)
The hope was that if he stepped down from prilosec to pepcid that he would not have worsening symptoms- if he had worsening symptoms that did not get back to baseline after transition- he may go back to prilosec.

## 2019-11-18 ENCOUNTER — Ambulatory Visit: Payer: BC Managed Care – PPO | Admitting: Neurology

## 2019-11-18 ENCOUNTER — Encounter: Payer: Self-pay | Admitting: Neurology

## 2019-11-18 VITALS — BP 115/71 | HR 70 | Ht 65.0 in | Wt 160.0 lb

## 2019-11-18 DIAGNOSIS — G4731 Primary central sleep apnea: Secondary | ICD-10-CM | POA: Diagnosis not present

## 2019-11-18 MED ORDER — ALPRAZOLAM 0.25 MG PO TABS: 0.2500 mg | ORAL_TABLET | Freq: Every evening | ORAL | 0 refills | Status: DC | PRN

## 2019-11-18 MED ORDER — SERTRALINE HCL 25 MG PO TABS
25.0000 mg | ORAL_TABLET | Freq: Every day | ORAL | 5 refills | Status: DC
Start: 2019-11-18 — End: 2020-04-28

## 2019-11-18 NOTE — Patient Instructions (Signed)
Sertraline tablets What is this medicine? SERTRALINE (SER tra leen) is used to treat depression. It may also be used to treat obsessive compulsive disorder, panic disorder, post-trauma stress, premenstrual dysphoric disorder (PMDD) or social anxiety. This medicine may be used for other purposes; ask your health care provider or pharmacist if you have questions. COMMON BRAND NAME(S): Zoloft What should I tell my health care provider before I take this medicine? They need to know if you have any of these conditions:  bleeding disorders  bipolar disorder or a family history of bipolar disorder  glaucoma  heart disease  high blood pressure  history of irregular heartbeat  history of low levels of calcium, magnesium, or potassium in the blood  if you often drink alcohol  liver disease  receiving electroconvulsive therapy  seizures  suicidal thoughts, plans, or attempt; a previous suicide attempt by you or a family member  take medicines that treat or prevent blood clots  thyroid disease  an unusual or allergic reaction to sertraline, other medicines, foods, dyes, or preservatives  pregnant or trying to get pregnant  breast-feeding How should I use this medicine? Take this medicine by mouth with a glass of water. Follow the directions on the prescription label. You can take it with or without food. Take your medicine at regular intervals. Do not take your medicine more often than directed. Do not stop taking this medicine suddenly except upon the advice of your doctor. Stopping this medicine too quickly may cause serious side effects or your condition may worsen. A special MedGuide will be given to you by the pharmacist with each prescription and refill. Be sure to read this information carefully each time. Talk to your pediatrician regarding the use of this medicine in children. While this drug may be prescribed for children as young as 7 years for selected conditions,  precautions do apply. Overdosage: If you think you have taken too much of this medicine contact a poison control center or emergency room at once. NOTE: This medicine is only for you. Do not share this medicine with others. What if I miss a dose? If you miss a dose, take it as soon as you can. If it is almost time for your next dose, take only that dose. Do not take double or extra doses. What may interact with this medicine? Do not take this medicine with any of the following medications:  cisapride  dronedarone  linezolid  MAOIs like Carbex, Eldepryl, Marplan, Nardil, and Parnate  methylene blue (injected into a vein)  pimozide  thioridazine This medicine may also interact with the following medications:  alcohol  amphetamines  aspirin and aspirin-like medicines  certain medicines for depression, anxiety, or psychotic disturbances  certain medicines for fungal infections like ketoconazole, fluconazole, posaconazole, and itraconazole  certain medicines for irregular heart beat like flecainide, quinidine, propafenone  certain medicines for migraine headaches like almotriptan, eletriptan, frovatriptan, naratriptan, rizatriptan, sumatriptan, zolmitriptan  certain medicines for sleep  certain medicines for seizures like carbamazepine, valproic acid, phenytoin  certain medicines that treat or prevent blood clots like warfarin, enoxaparin, dalteparin  cimetidine  digoxin  diuretics  fentanyl  isoniazid  lithium  NSAIDs, medicines for pain and inflammation, like ibuprofen or naproxen  other medicines that prolong the QT interval (cause an abnormal heart rhythm) like dofetilide  rasagiline  safinamide  supplements like St. John's wort, kava kava, valerian  tolbutamide  tramadol  tryptophan This list may not describe all possible interactions. Give your health care  provider a list of all the medicines, herbs, non-prescription drugs, or dietary supplements  you use. Also tell them if you smoke, drink alcohol, or use illegal drugs. Some items may interact with your medicine. What should I watch for while using this medicine? Tell your doctor if your symptoms do not get better or if they get worse. Visit your doctor or health care professional for regular checks on your progress. Because it may take several weeks to see the full effects of this medicine, it is important to continue your treatment as prescribed by your doctor. Patients and their families should watch out for new or worsening thoughts of suicide or depression. Also watch out for sudden changes in feelings such as feeling anxious, agitated, panicky, irritable, hostile, aggressive, impulsive, severely restless, overly excited and hyperactive, or not being able to sleep. If this happens, especially at the beginning of treatment or after a change in dose, call your health care professional. Dennis Bast may get drowsy or dizzy. Do not drive, use machinery, or do anything that needs mental alertness until you know how this medicine affects you. Do not stand or sit up quickly, especially if you are an older patient. This reduces the risk of dizzy or fainting spells. Alcohol may interfere with the effect of this medicine. Avoid alcoholic drinks. Your mouth may get dry. Chewing sugarless gum or sucking hard candy, and drinking plenty of water may help. Contact your doctor if the problem does not go away or is severe. What side effects may I notice from receiving this medicine? Side effects that you should report to your doctor or health care professional as soon as possible:  allergic reactions like skin rash, itching or hives, swelling of the face, lips, or tongue  anxious  black, tarry stools  changes in vision  confusion  elevated mood, decreased need for sleep, racing thoughts, impulsive behavior  eye pain  fast, irregular heartbeat  feeling faint or lightheaded, falls  feeling agitated,  angry, or irritable  hallucination, loss of contact with reality  loss of balance or coordination  loss of memory  painful or prolonged erections  restlessness, pacing, inability to keep still  seizures  stiff muscles  suicidal thoughts or other mood changes  trouble sleeping  unusual bleeding or bruising  unusually weak or tired  vomiting Side effects that usually do not require medical attention (report to your doctor or health care professional if they continue or are bothersome):  change in appetite or weight  change in sex drive or performance  diarrhea  increased sweating  indigestion, nausea  tremors This list may not describe all possible side effects. Call your doctor for medical advice about side effects. You may report side effects to FDA at 1-800-FDA-1088. Where should I keep my medicine? Keep out of the reach of children. Store at room temperature between 15 and 30 degrees C (59 and 86 degrees F). Throw away any unused medicine after the expiration date. NOTE: This sheet is a summary. It may not cover all possible information. If you have questions about this medicine, talk to your doctor, pharmacist, or health care provider.  2020 Elsevier/[employer] Standard ([id number] 65:03:54) Complicated Grief Grief is a normal response to the death of someone close to you. Feelings of fear, anger, and guilt can affect almost everyone who loses a loved one. It is also common to have symptoms of depression while you are grieving. These include problems with sleep, loss of appetite, and lack of energy. They  may last for weeks or months after a loss. Complicated grief is different from normal grief or depression. Normal grieving involves sadness and feelings of loss, but those feelings get better and heal over time. Complicated grief is a severe type of grief that lasts for a long time, usually for several months to a year or longer. It interferes with your ability to function  normally. Complicated grief may require treatment from a mental health care provider. What are the causes? The cause of this condition is not known. It is not clear why some people continue to struggle with grief and others do not. What increases the risk? You are more likely to develop this condition if:  The death of your loved one was sudden or unexpected.  The death of your loved one was due to a violent event.  Your loved one died from suicide.  Your loved one was a child or a young person.  You were very close to your loved one, or you were dependent on him or her.  You have a history of depression or anxiety. What are the signs or symptoms? Symptoms of this condition include:  Feeling disbelief or having a lack of emotion (numbness).  Being unable to enjoy good memories of your loved one.  Needing to avoid anything or anyone that reminds you of your loved one.  Being unable to stop thinking about the death.  Feeling intense anger or guilt.  Feeling alone and hopeless.  Feeling that your life is meaningless and empty.  Losing the desire to move on with your life. How is this diagnosed? This condition may be diagnosed based on:  Your symptoms. Complicated grief will be diagnosed if you have ongoing symptoms of grief for 6-12 months or longer.  The effect of symptoms on your life. You may be diagnosed with this condition if your symptoms are interfering with your ability to live your life. Your health care provider may recommend that you see a mental health care provider. Many symptoms of depression are similar to the symptoms of complicated grief. It is important to be evaluated for complicated grief along with other mental health conditions. How is this treated? This condition is most commonly treated with talk therapy. This therapy is offered by a mental health specialist (psychiatrist). During therapy:  You will learn healthy ways to cope with the loss of your  loved one.  Your mental health care provider may recommend antidepressant medicines. Follow these instructions at home: Lifestyle   Take care of yourself. ? Eat on a regular basis, and maintain a healthy diet. Eat plenty of fruits, vegetables, lean protein, and whole grains. ? Try to get some exercise each day. Aim for 30 minutes of exercise on most days of the week. ? Keep a consistent sleep schedule. Try to get 8 or more hours of sleep each night. ? Start doing the things that you used to enjoy.  Do not use drugs or alcohol to ease your symptoms.  Spend time with friends and loved ones. General instructions  Take over-the-counter and prescription medicines only as told by your health care provider.  Consider joining a grief (bereavement) support group to help you deal with your loss.  Keep all follow-up visits as told by your health care provider. This is important. Contact a health care provider if:  Your symptoms prevent you from functioning normally.  Your symptoms do not get better with treatment. Get help right away if:  You have serious thoughts  about hurting yourself or someone else.  You have suicidal feelings. If you ever feel like you may hurt yourself or others, or have thoughts about taking your own life, get help right away. You can go to your nearest emergency department or call:  Your local emergency services (911 in the [place]  A suicide crisis helpline, such as the [facility] at 770 883 3704. This is open 24 hours a day. Summary  Complicated grief is a severe type of grief that lasts for a long time. This grief is not likely to go away on its own. Get the help you need.  Some griefs are more difficult than others and can cause this condition. You may need a certain type of treatment to help you recover if the loss of your loved one was sudden, violent, or due to suicide.  You may feel guilty about moving on with your life.  Getting help does not mean that you are forgetting your loved one. It means that you are taking care of yourself.  Complicated grief is best treated with talk therapy. Medicines may also be prescribed.  Seek the help you need, and find support that will help you recover. This information is not intended to replace advice given to you by your health care provider. Make sure you discuss any questions you have with your health care provider. Document Revised: 12/01/2016 Document Reviewed: 10/04/2016 Elsevier Patient Education  [employer].

## 2019-11-18 NOTE — Progress Notes (Signed)
SLEEP MEDICINE CLINIC    Provider:  Larey Seat, MD  Primary Care Physician:  Marin Olp, [place] [place] [address]     Referring Provider: Marin Olp, [address] [place],  [place] [address]          Chief Complaint according to patient   Patient presents with:    . New Patient (Initial Visit)     pt alone, rm 10. presents today for concerns of not sleeping well. he had a SS c/o here 7+ yrs ago, which had indicated mixed apnea.  He started CPAP and tried it for 54yr and barely unable to tolerate, finally stopped. For the last  6 yrs he slept fine without- despite having the apnea. 59yr ago he all of a sudden started to have difficulty with sleeping. he states that he feels like don't sleep at all even through he know he does. he states wife said he snores.  he is interested in seeing of something can be done to help him sleep.  New is insomnia, 'the night lasts forever" and started after his mother's death last year.       HISTORY OF PRESENT ILLNESS:  Alejandro [name] is a 59  Year- old Caucasian male patient is seen here in a consultation on 11/18/2019 requested by Dr. Yong Channel.  Chief concern according to patient :  'my mother struggled with interstittial lung disease, finbrosis and died with shortness of breath- a sickness than progressed over 1 final year". I has similar reaction to a close friends death by suicide in 04/22/2002, used xanax for 3 month".     I have the pleasure of seeing Alejandro [name] today, a right -handed White or Caucasian male with a history of mixed apnea sleep disorder. He  has a past medical history of Allergy, Central sleep apnea, Colon cancer ([place]), Complex sleep apnea syndrome, Complication of anesthesia, GERD (gastroesophageal reflux disease), HEMORRHOIDS-INTERNAL (10/01/2007), High cholesterol, PONV (postoperative nausea and vomiting), Psoriasis, and Right elbow pain (08/19/2015).  The patient had the  first sleep study in the year 04/21/2012 with a result of mixed, complex apnea .   Sleep relevant medical history: history of untreated sleep apnea. Nightmares, insomnia. Used to dream he was falling, woke up screaming. Family medical /sleep history: No other family member on CPAP with OSA, insomnia, no sleep walkers.    Social history:  Patient is working as Biochemist, clinical- low stress- no travel now,  and lives in a household with spouse, has 2 grown daughters. Younger one is aloof.  The patient currently works in daytime.Pets are present. Tobacco use- never. ETOH use : rare ,  Caffeine intake in form of Coffee( /) Soda( very little ) Tea ( not daily) or energy drinks. Regular exercise; tennis, golf.  Hobbies : walking, hiking.    Sleep habits are as follows: The patient's dinner time is between 7 PM. The patient goes to bed at 11.30 PM and struggles to sleep-once asleep, he continues to sleep for 5-6 hours, but wakes for 1 bathroom break.  Bowel movements  at 4-5 AM.  The preferred sleep position is either laterally or supine  , with the support of one pillow. Flat bed. GERD treated with Omeprazole.  Dreams are reportedly frequent/vivid. 5.30-6 AM is the usual rise time. The patient wakes up spontaneously.  He  reports not feeling refreshed or restored in AM, with symptoms such as residual fatigue.  Naps  are not taken : l can't nap even if I wanted to"   Review of Systems: Out of a complete 14 system review, the patient complains of only the following symptoms, and all other reviewed systems are negative.:  Fatigue, sleepiness , snoring, fragmented sleep, Insomnia - new onset    How likely are you to doze in the following situations: 0 = not likely, 1 = slight chance, 2 = moderate chance, 3 = high chance   Sitting and Reading? Watching Television? Sitting inactive in a public place (theater or meeting)? As a passenger in a car for an hour without a break? Lying down in the afternoon when  circumstances permit? Sitting and talking to someone? Sitting quietly after lunch without alcohol? In a car, while stopped for a few minutes in traffic?   Total = 14/ 24 points   FSS endorsed at 35/ 63 points.   Social History   Socioeconomic History  . Marital status: Married    Spouse name: Not on file  . Number of children: 2  . Years of education: BS  . Highest education level: Not on file  Occupational History    Comment: [employer]  Tobacco Use  . Smoking status: Never Smoker  . Smokeless tobacco: Never Used  Vaping Use  . Vaping Use: Never used  Substance and Sexual Activity  . Alcohol use: Yes    Alcohol/week: 3.0 - 4.0 standard drinks    Types: 3 - 4 Cans of beer per week    Comment: 3 - 4 beers during some weeks   . Drug use: No  . Sexual activity: Yes  Other Topics Concern  . Not on file  Social History Narrative   Married. 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 Difficulty of Paying Living Expenses: Not on file  Food Insecurity:   . Worried About Charity fundraiser in the Last Year: Not on file  . Ran Out of Food in the Last Year: Not on file  Transportation Needs:   . Lack of Transportation (Medical): Not on file  . Lack of Transportation (Non-Medical): Not on file  Physical Activity:   . Days of Exercise per Week: Not on file  . Minutes of Exercise per Session: Not on file  Stress:   . Feeling of Stress : Not on file  Social Connections:   . Frequency of Communication with Friends and Family: Not on file  . Frequency of Social Gatherings with Friends and Family: Not on file  . Attends Religious Services: Not on file  . Active Member of Clubs or  Organizations: Not on file  . Attends Archivist Meetings: Not on file  . Marital Status: Not on file    Family History  Problem Relation Age of Onset  . Sleep disorder Father   . Hyperlipidemia Father   . Sleep disorder Paternal Grandfather   . Heart disease Paternal Grandfather   . Cancer Paternal Grandmother        breast  . Breast cancer Mother   . Alcohol abuse Mother   . Eating disorder Mother   . Diabetes Maternal Grandmother   . Heart  disease Maternal Grandfather        late life  . Hyperlipidemia Maternal Grandfather   . Stroke Maternal Grandfather        late life    Past Medical History:  Diagnosis Date  . Allergy   . Central sleep apnea    AHI of 44 exacerbated to 52 and failed CPAP  . Colon cancer ([place])    polpy removed from colon that was cancerous  . Complex sleep apnea syndrome    baseline AHI of 44 on 04-21-11, changed to adapt SV    . Complication of anesthesia    Patient has hard time waking up  . GERD (gastroesophageal reflux disease)   . HEMORRHOIDS-INTERNAL 10/01/2007   Qualifier: Diagnosis of  By: Fuller Plan MD Marijo Conception High cholesterol   . PONV (postoperative nausea and vomiting)   . Psoriasis   . Right elbow pain 08/19/2015    Past Surgical History:  Procedure Laterality Date  . COLONOSCOPY  2009   CANCEROUS POLYPS REMOVED  . KNEE ARTHROSCOPY Left 1979  . KNEE ARTHROSCOPY Right 08/22/2012   Procedure: RIGHT KNEE ARTHROSCOPY WITH DEBRIDEMENT ;  Surgeon: Marin Shutter, MD;  Location: [facility];  Service: Orthopedics;  Laterality: Right;  . WRIST FRACTURE SURGERY Left ~ 1995     Current Outpatient Medications on File Prior to Visit  Medication Sig Dispense Refill  . atorvastatin (LIPITOR) 40 MG tablet Take 1 tablet by mouth once daily 90 tablet 0  . Calcipotriene 0.005 % solution APPLY  SOLUTION TOPICALLY TWICE DAILY 60 mL 0  . clotrimazole-betamethasone (LOTRISONE) cream APPLY 1 APPLICATION TOPICALLY 2 TIMES DAILY (Patient not  taking: Reported on 11/18/2019) 30 g 0  . meclizine (ANTIVERT) 12.5 MG tablet Take 1 tablet (12.5 mg total) by mouth 3 (three) times daily as needed for dizziness (or motion sickness). Do not take  with scopolamine (Patient not taking: Reported on 11/18/2019) 30 tablet 1  . ondansetron (ZOFRAN) 4 MG tablet Take 4 mg by mouth every 8 (eight) hours as needed for nausea or vomiting. (Patient not taking: Reported on 11/18/2019)    . scopolamine (TRANSDERM-SCOP) 1 MG/3DAYS Place 1 patch (1.5 mg total) onto the skin every 3 (three) days as needed (For motion sickness). (Patient not taking: Reported on 11/18/2019) 10 patch 0   No current facility-administered medications on file prior to visit.    Allergies  Allergen Reactions  . Cortisone   . Depo-Medrol [Methylprednisolone Sodium Succ] Other (See Comments)    Blurry vision- blisters behind retinas  . Other Other (See Comments)    Does/t likt narcotics and cannot take steroids  Blurry vision- blisters behind retinas   . Codeine Other (See Comments)    REACTION: nausea, vomiting ABD PAIN REACTION: nausea, vomiting    Physical exam:  Today's Vitals   11/18/19 1301  BP: 115/71  Pulse: 70  Weight: 160 lb (72.6 kg)  Height: 5\' 5"  (1.651 m)   Body mass index is 26.63 kg/m.   Wt Readings from Last 3 Encounters:  11/18/19 160 lb (72.6 kg)  10/21/19 161 lb 12.8 oz (73.4 kg)  05/31/18 149 lb (67.6 kg)     Ht Readings from Last 3 Encounters:  11/18/19 5\' 5"  (1.651 m)  10/21/19 5\' 5"  (1.651 m)  05/31/18 5\' 5"  (1.651 m)      General: The patient is awake, alert and appears not in acute distress. The patient is well groomed. Head: Normocephalic, atraumatic.  Neck is  supple. Mallampati 2,  neck circumference:16 inches .  Nasal airflow congested- deviated septum -  Retrognathia is mild .  Dental status: intact  Cardiovascular:  Regular rate and cardiac rhythm by pulse,  without distended neck veins. Respiratory: Lungs are clear to  auscultation.  Skin:  Without evidence of ankle edema, or rash. Trunk: The patient's posture is erect.   Neurologic exam : The patient is awake and alert, oriented to place and time.   Memory subjective described as intact.  Attention span & concentration ability appears normal.  Speech is fluent,  without  dysarthria, dysphonia or aphasia.  Mood and affect are appropriate.   Cranial nerves: no loss of smell or taste reported  Pupils are equal and briskly reactive to light. Funduscopic exam deferred.  Extraocular movements in vertical and horizontal planes were intact and without nystagmus. No Diplopia. Visual fields by finger perimetry are intact. Hearing was intact to soft voice and finger rubbing.    Facial sensation intact to fine touch.  Facial motor strength is symmetric and tongue and uvula move midline.  Neck ROM : rotation, tilt and flexion extension were normal for age and shoulder shrug was symmetrical.  He reports frequent neck pain, forcing him to sleep more supine.    Motor exam:  Symmetric bulk, tone and ROM.   Normal tone without cog wheeling, symmetric grip strength .   Sensory: Vibration normal.  Proprioception tested in the upper extremities was normal.   Coordination: Rapid alternating movements in the fingers/hands were of normal speed.  The Finger-to-nose maneuver was intact without evidence of ataxia, dysmetria or tremor.   Gait and station: Patient could rise unassisted from a seated position, walked without assistive device.  Stance is of normal width/ base and the patient turned with 3 steps.  Toe and heel walk were deferred.  Deep tendon reflexes: in the  upper and lower extremities are symmetric and intact.  Babinski response was deferred.       After spending a total time of 45 minutes face to face and additional time for physical and neurologic examination, review of laboratory studies,  personal review of imaging studies, reports and results of  other testing and review of referral information / records as far as provided in visit, I have established the following assessments:  I have the pleasure of seeing Mr. Dyche today again he underwent a sleep study with [facility] in November 2013 and was diagnosed with severe complex sleep apnea at an AHI of 44/h. His apnea was central and obstructive there were even some mixed events. He could not tolerate CPAP very well and he actually stopped using CPAP after a year as he felt that the benefits did not outweigh the risks. He slept simply better without a device. His main problem was to tolerate a mask and a mask or interface. The patient's overall sleep lifestyle and sleep habits have not changed much. His bedroom is cool quiet and dark he sleeps on one pillow his bed is adjustable but he only tolerates a slight incline. He has been treated for GERD. He is not consuming tobacco, excessive alcohol or caffeine.  We presume that he still has the same apnea currently untreated but there is also a component that is new, that of sleep initiation insomnia. This started with the death of his mother and is almost certainly related to grief. I would like to address both issues #1 I will repeat a sleep study of course to see how  his current apnea is rated between central and obstructive and also to what degree apnea is present but I will also address some of the typical insomnia treatments aside from the already initiated sleep hygiene education that his primary care provider provided.   My Plan is to proceed with:  1) we had tried Trazodone in 2013 , he didn't like it. I would chose an SSRI, Zoloft, Lexapro, etc.  2) order HST- PSG, consider  SPLIT-    I would like to thank Marin Olp, [address] [place],  [place] [address] for allowing me to meet with and to take care of this pleasant patient.   In short, Tyjai Errin Chewning is presenting with sleep initiation insomnia and untreated  apnea.  I plan to follow up either personally or through our NP within 3 month.   CC: I will share my notes with PCP.  Electronically signed by: Larey Seat, MD 11/18/2019 1:06 PM  [facility] Board certified by The [employer] of Sleep Medicine and Diplomate of the [employer] of Sleep Medicine. Board certified In Neurology through the [place], Fellow of the [employer] of Neurology. Medical Director of [facility].

## 2019-11-18 NOTE — Addendum Note (Signed)
Addended by: Larey Seat on: 11/18/2019 01:54 PM   Modules accepted: Orders

## 2019-12-02 ENCOUNTER — Institutional Professional Consult (permissible substitution): Payer: Self-pay | Admitting: Neurology

## 2019-12-04 ENCOUNTER — Institutional Professional Consult (permissible substitution): Payer: Self-pay | Admitting: Neurology

## 2019-12-07 ENCOUNTER — Other Ambulatory Visit: Payer: Self-pay | Admitting: Family Medicine

## 2019-12-11 ENCOUNTER — Telehealth: Payer: Self-pay | Admitting: Neurology

## 2019-12-11 NOTE — Telephone Encounter (Signed)
Called the patient back. Per notes Dr Brett Fairy had started this as a part of a treatment plan. Patient states he is doing well on it but would like to eventually come off of the medication. He doesn't want to be on it long term. I advised the patient for now continue the medication. He has a sleep study coming up next week. Informed I will contact after she reviews the sleep study and at that point can decide when to wean off.

## 2019-12-11 NOTE — Telephone Encounter (Signed)
Pt called stating that he has about a week left of his sertraline (ZOLOFT) 25 MG tablet and he would like to know is he to stay on this or is he having to wean himself off of it. Please advise.

## 2019-12-13 ENCOUNTER — Other Ambulatory Visit: Payer: Self-pay | Admitting: Family Medicine

## 2019-12-18 ENCOUNTER — Other Ambulatory Visit: Payer: Self-pay

## 2019-12-18 ENCOUNTER — Ambulatory Visit (INDEPENDENT_AMBULATORY_CARE_PROVIDER_SITE_OTHER): Payer: BC Managed Care – PPO | Admitting: Neurology

## 2019-12-18 DIAGNOSIS — G4731 Primary central sleep apnea: Secondary | ICD-10-CM

## 2019-12-18 DIAGNOSIS — Z789 Other specified health status: Secondary | ICD-10-CM

## 2019-12-19 DIAGNOSIS — Z789 Other specified health status: Secondary | ICD-10-CM | POA: Insufficient documentation

## 2019-12-19 DIAGNOSIS — G4731 Primary central sleep apnea: Secondary | ICD-10-CM | POA: Insufficient documentation

## 2019-12-19 NOTE — Addendum Note (Signed)
Addended by: Larey Seat on: 12/19/2019 04:22 PM   Modules accepted: Orders

## 2019-12-19 NOTE — Progress Notes (Signed)
PATIENT'S NAME:  Alejandro [name], Alejandro [name] DOB:      1960/08/02      MR#:    [id number]     DATE OF RECORDING: 12/18/2019  B. Otto Herb M.D.:  Marin Olp, MD Study Performed:   Baseline Polysomnogram HISTORY:  Alejandro [name] has a medical history of Allergy, Central sleep apnea, Colon cancer ([place]), Complex sleep apnea syndrome, Complication of anesthesia, GERD (gastroesophageal reflux disease), HEMORRHOIDS-INTERNAL (10/01/2007), High cholesterol, PONV (postoperative nausea and vomiting), Psoriasis, and Right elbow pain (08/19/2015).  The patient had the first sleep study in the year 2014 with a result of mixed, complex apnea.  The patient endorsed the Epworth Sleepiness Scale at 14 points.   The patient's weight 160 pounds with a height of 65 (inches), resulting in a BMI of 26.8 kg/m2. The patient's neck circumference measured 16 inches.  CURRENT MEDICATIONS: Lipitor, Lotrisone cream, Antivert, Zofran, Scopolamine  PROCEDURE:  This is a multichannel digital polysomnogram utilizing the Somnostar 11.2 system.  Electrodes and sensors were applied and monitored per AASM Specifications.   EEG, EOG, Chin and Limb EMG, were sampled at 200 Hz.  ECG, Snore and Nasal Pressure, Thermal Airflow, Respiratory Effort, CPAP Flow and Pressure, Oximetry was sampled at 50 Hz. Digital video and audio were recorded.      BASELINE STUDY: Lights Out was at 22:57 and Lights On at 04:18.  Total recording time (TRT) was 321.5 minutes, with a total sleep time (TST) of 260.5 minutes.   The patient's sleep latency was 14 minutes.  REM latency was 192 minutes.  The sleep efficiency was 81.1 %.     SLEEP ARCHITECTURE: WASO (Wake after sleep onset) was 49 minutes.  There were 41.5 minutes in Stage N1, 133 minutes Stage N2, 32 minutes Stage N3 and 54 minutes in Stage REM.  The percentage of Stage N1 was 15.9%, Stage N2 was 51.1%, Stage N3 was 12.3% and Stage R (REM sleep) was 20.7%.    RESPIRATORY ANALYSIS:  There  were a total of 171 respiratory events:  43 obstructive apneas, 0 central apneas and 0 mixed apneas with a total of 43 apneas and an apnea index (AI) of 9.9 /hour. There were 128 hypopneas with a hypopnea index of 29.5 /hour.  The total APNEA/HYPOPNEA INDEX (AHI) was 39.4/hour.  34 events occurred in REM sleep and 216 events in NREM. The REM AHI was 37.8 /hour, versus a non-REM AHI of 39.8. The patient spent 104.5 minutes of total sleep time in the supine position and 156 minutes in non-supine. The supine AHI was 64.3/h versus a non-supine AHI of 22.7/h.  OXYGEN SATURATION & C02:  The Wake baseline 02 saturation was 95%, with the lowest being 72%. Time spent below 89% saturation equaled 51 minutes. The arousals were noted as: 34 were spontaneous, 0 were associated with PLMs, 135 were associated with respiratory events.The patient had a total of 0 Periodic Limb Movements.    Audio and video analysis did not show any abnormal or unusual movements, behaviors, phonations or vocalizations.   Snoring was noted. EKG was in keeping with normal sinus rhythm (NSR).   IMPRESSION:  1. This is severe sleep apnea, AHI of 39.4/h , presenting with hypoxemia and equally in REM and NREM sleep.  2. Cyclic, severe and COMPLEX SLEEP APNEA, accentuated by supine sleep.  3. Severely fragmented sleep. 4. Sleep hypoxemia with SpO2 nadir of 72% and duration of 51 minutes.     RECOMMENDATIONS:  1. Advise full-night, attended, PAP titration  study to optimize therapy.  If the patient's insurance will not permit such a study, consider BiPAP modality if CPAP is again not tolerated.    I certify that I have reviewed the entire raw data recording prior to the issuance of this report in accordance with the Standards of Accreditation of the American Academy of Sleep Medicine (AASM)   Larey Seat, MD Diplomat, American Board of Psychiatry and Neurology  Diplomat, American Board of Sleep Medicine Market researcher,  [facility] [employer]

## 2019-12-19 NOTE — Procedures (Signed)
PATIENT'S NAME:  Alejandro [name], Alejandro [name] DOB:      [birth year]      MR#:    [id number]     DATE OF RECORDING: 12/18/2019  B. Otto Herb M.D.:  Marin Olp, MD Study Performed:   Baseline Polysomnogram HISTORY:  Alejandro [name] has a medical history of Allergy, Central sleep apnea, Colon cancer ([place]), Complex sleep apnea syndrome, Complication of anesthesia, GERD (gastroesophageal reflux disease), HEMORRHOIDS-INTERNAL (10/01/2007), High cholesterol, PONV (postoperative nausea and vomiting), Psoriasis, and Right elbow pain (08/19/2015).  The patient had the first sleep study in the year 2014 with a result of mixed, complex apnea.  The patient endorsed the Epworth Sleepiness Scale at 14 points.   The patient's weight 160 pounds with a height of 65 (inches), resulting in a BMI of 26.8 kg/m2. The patient's neck circumference measured 16 inches.  CURRENT MEDICATIONS: Lipitor, Lotrisone cream, Antivert, Zofran, Scopolamine   PROCEDURE:  This is a multichannel digital polysomnogram utilizing the Somnostar 11.2 system.  Electrodes and sensors were applied and monitored per AASM Specifications.   EEG, EOG, Chin and Limb EMG, were sampled at 200 Hz.  ECG, Snore and Nasal Pressure, Thermal Airflow, Respiratory Effort, CPAP Flow and Pressure, Oximetry was sampled at 50 Hz. Digital video and audio were recorded.      BASELINE STUDY: Lights Out was at 22:57 and Lights On at 04:18.  Total recording time (TRT) was 321.5 minutes, with a total sleep time (TST) of 260.5 minutes.   The patient's sleep latency was 14 minutes.  REM latency was 192 minutes.  The sleep efficiency was 81.1 %.     SLEEP ARCHITECTURE: WASO (Wake after sleep onset) was 49 minutes.  There were 41.5 minutes in Stage N1, 133 minutes Stage N2, 32 minutes Stage N3 and 54 minutes in Stage REM.  The percentage of Stage N1 was 15.9%, Stage N2 was 51.1%, Stage N3 was 12.3% and Stage R (REM sleep) was 20.7%.    RESPIRATORY ANALYSIS:  There  were a total of 171 respiratory events:  43 obstructive apneas, 0 central apneas and 0 mixed apneas with a total of 43 apneas and an apnea index (AI) of 9.9 /hour. There were 128 hypopneas with a hypopnea index of 29.5 /hour.  The total APNEA/HYPOPNEA INDEX (AHI) was 39.4/hour.  34 events occurred in REM sleep and 216 events in NREM. The REM AHI was 37.8 /hour, versus a non-REM AHI of 39.8. The patient spent 104.5 minutes of total sleep time in the supine position and 156 minutes in non-supine. The supine AHI was 64.3/h versus a non-supine AHI of 22.7/h.  OXYGEN SATURATION & C02:  The Wake baseline 02 saturation was 95%, with the lowest being 72%. Time spent below 89% saturation equaled 51 minutes. The arousals were noted as: 34 were spontaneous, 0 were associated with PLMs, 135 were associated with respiratory events.The patient had a total of 0 Periodic Limb Movements.    Audio and video analysis did not show any abnormal or unusual movements, behaviors, phonations or vocalizations.   Snoring was noted. EKG was in keeping with normal sinus rhythm (NSR).   IMPRESSION:  1. This is severe sleep apnea, AHI of 39.4/h , presenting with hypoxemia and equally in REM and NREM sleep.  2. Cyclic, severe and COMPLEX SLEEP APNEA, accentuated by supine sleep.  3. Severely fragmented sleep. 4. Sleep hypoxemia with SpO2 nadir of 72% and duration of 51 minutes.     RECOMMENDATIONS:  1. Advise full-night, attended, PAP  titration study to optimize therapy.  If the patient's insurance will not permit such a study, consider BiPAP modality if CPAP is again not tolerated.    I certify that I have reviewed the entire raw data recording prior to the issuance of this report in accordance with the Standards of Accreditation of the American Academy of Sleep Medicine (AASM)   Larey Seat, MD Diplomat, American Board of Psychiatry and Neurology  Diplomat, American Board of Sleep Medicine Market researcher,  [facility] [employer]

## 2019-12-22 ENCOUNTER — Telehealth: Payer: Self-pay | Admitting: Neurology

## 2019-12-22 ENCOUNTER — Telehealth: Payer: Self-pay

## 2019-12-22 ENCOUNTER — Encounter: Payer: Self-pay | Admitting: Neurology

## 2019-12-22 ENCOUNTER — Other Ambulatory Visit: Payer: Self-pay | Admitting: Neurology

## 2019-12-22 DIAGNOSIS — G4731 Primary central sleep apnea: Secondary | ICD-10-CM

## 2019-12-22 NOTE — Telephone Encounter (Signed)
[employer] will deny cpap titration due to no centrals on PSG. This study was approved because he had a had a history of central apnea but none was found on PSG. He will need an auto 5-20 sent to DME.

## 2019-12-22 NOTE — Telephone Encounter (Signed)
done

## 2019-12-22 NOTE — Telephone Encounter (Signed)
----- Message from Larey Seat, MD sent at 12/19/2019  4:22 PM EST ----- PATIENT'S NAME:  Alejandro [name], Alejandro [name] DOB:      [birth year]      MR#:    [id number]     DATE OF RECORDING: 12/18/2019  B. Otto Herb M.D.:  Marin Olp, MD Study Performed:   Baseline Polysomnogram HISTORY:  Alejandro [name] has a medical history of Allergy, Central sleep apnea, Colon cancer ([place]), Complex sleep apnea syndrome, Complication of anesthesia, GERD (gastroesophageal reflux disease), HEMORRHOIDS-INTERNAL (10/01/2007), High cholesterol, PONV (postoperative nausea and vomiting), Psoriasis, and Right elbow pain (08/19/2015).  The patient had the first sleep study in the year 2014 with a result of mixed, complex apnea.  The patient endorsed the Epworth Sleepiness Scale at 14 points.   The patient's weight 160 pounds with a height of 65 (inches), resulting in a BMI of 26.8 kg/m2. The patient's neck circumference measured 16 inches.  CURRENT MEDICATIONS: Lipitor, Lotrisone cream, Antivert, Zofran, Scopolamine   PROCEDURE:  This is a multichannel digital polysomnogram utilizing the Somnostar 11.2 system.  Electrodes and sensors were applied and monitored per AASM Specifications.   EEG, EOG, Chin and Limb EMG, were sampled at 200 Hz.  ECG, Snore and Nasal Pressure, Thermal Airflow, Respiratory Effort, CPAP Flow and Pressure, Oximetry was sampled at 50 Hz. Digital video and audio were recorded.      BASELINE STUDY: Lights Out was at 22:57 and Lights On at 04:18.  Total recording time (TRT) was 321.5 minutes, with a total sleep time (TST) of 260.5 minutes.   The patient's sleep latency was 14 minutes.  REM latency was 192 minutes.  The sleep efficiency was 81.1 %.     SLEEP ARCHITECTURE: WASO (Wake after sleep onset) was 49 minutes.  There were 41.5 minutes in Stage N1, 133 minutes Stage N2, 32 minutes Stage N3 and 54 minutes in Stage REM.  The percentage of Stage N1 was 15.9%, Stage N2 was 51.1%, Stage N3 was  12.3% and Stage R (REM sleep) was 20.7%.    RESPIRATORY ANALYSIS:  There were a total of 171 respiratory events:  43 obstructive apneas, 0 central apneas and 0 mixed apneas with a total of 43 apneas and an apnea index (AI) of 9.9 /hour. There were 128 hypopneas with a hypopnea index of 29.5 /hour.  The total APNEA/HYPOPNEA INDEX (AHI) was 39.4/hour.  34 events occurred in REM sleep and 216 events in NREM. The REM AHI was 37.8 /hour, versus a non-REM AHI of 39.8. The patient spent 104.5 minutes of total sleep time in the supine position and 156 minutes in non-supine. The supine AHI was 64.3/h versus a non-supine AHI of 22.7/h.  OXYGEN SATURATION & C02:  The Wake baseline 02 saturation was 95%, with the lowest being 72%. Time spent below 89% saturation equaled 51 minutes. The arousals were noted as: 34 were spontaneous, 0 were associated with PLMs, 135 were associated with respiratory events.The patient had a total of 0 Periodic Limb Movements.    Audio and video analysis did not show any abnormal or unusual movements, behaviors, phonations or vocalizations.   Snoring was noted. EKG was in keeping with normal sinus rhythm (NSR).   IMPRESSION:  1. This is severe sleep apnea, AHI of 39.4/h , presenting with hypoxemia and equally in REM and NREM sleep.  2. Cyclic, severe and COMPLEX SLEEP APNEA, accentuated by supine sleep.  3. Severely fragmented sleep. 4. Sleep hypoxemia with SpO2 nadir of 72% and duration  of 51 minutes.     RECOMMENDATIONS:  1. Advise full-night, attended, PAP titration study to optimize therapy.  If the patient's insurance will not permit such a study, consider BiPAP modality if CPAP is again not tolerated.    I certify that I have reviewed the entire raw data recording prior to the issuance of this report in accordance with the Standards of Accreditation of the American Academy of Sleep Medicine (AASM)   Larey Seat, MD Diplomat, American Board of Psychiatry and  Neurology  Diplomat, American Board of Sleep Medicine Market researcher, [facility] [employer]

## 2019-12-22 NOTE — Telephone Encounter (Signed)
Pt returned call. I advised pt that Dr. Brett Fairy reviewed their sleep study results and found that pt has severe sleep apnea. Dr. Brett Fairy recommends that pt starts auto CPAP. I reviewed PAP compliance expectations with the pt. Pt is agreeable to starting a CPAP. I advised pt that an order will be sent to a DME, [facility] and [facility] will call the pt within about one week after they file with the pt's insurance. [facility]) will show the pt how to use the machine, fit for masks, and troubleshoot the CPAP if needed. A follow up appt will need to be made for insurance purposes with Dr. Brett Fairy or the NP. Pt verbalized understanding of results. Pt had some concerns he wanted me to address with Dr Brett Fairy. He states that he saw an add for inspire and would like to know if he would be a candidate since he tried and failed CPAP within 5 yrs. Advised that I would bring that to Dr Dohmeier's attention. Advised if she agrees then order for a referral to ENT can also be sent for him to explore that option as well, he would prefer to at least explore this route to see if this would be a good option for him. He also wanted to know what the plan for the sertraline is. He would like to know how long her should stay on this med. He would like to come off when time is right. Advised the patient I have sent the order to [facility])  and have received confirmation that they have received the order.  Advised I would discuss with dr Dohmeier further about his other concerns upon her return to work in couple weeks. The patient understood.

## 2019-12-22 NOTE — Telephone Encounter (Signed)
Called patient to discuss sleep study results. No answer at this time. LVM for the patient to call back.   I have also forwarded the mychart message to the patient and have placed the order for auto CPAP since the insurance denied in lab titration study

## 2019-12-23 DIAGNOSIS — Z20822 Contact with and (suspected) exposure to covid-19: Secondary | ICD-10-CM | POA: Diagnosis not present

## 2020-01-05 ENCOUNTER — Encounter: Payer: Self-pay | Admitting: Neurology

## 2020-01-07 ENCOUNTER — Other Ambulatory Visit: Payer: Self-pay | Admitting: Neurology

## 2020-01-07 ENCOUNTER — Telehealth: Payer: Self-pay | Admitting: Neurology

## 2020-01-07 DIAGNOSIS — G4731 Primary central sleep apnea: Secondary | ICD-10-CM

## 2020-01-07 NOTE — Telephone Encounter (Signed)
Called the patient and advised Dr Vickey Huger reviewed his study and chart and states that certainly a referral to ENT to assess if you are a candidate for inspire is a option for you. Advised that she would agree to place referral for him to assess if he could be a candidate from a ENT standpoint. Pt agreed to sending the referral and that process started. In regards to the sertaline, Dr Dohmeier indicated that she would like the pt to have at least tried the medication for 3 months. This is a very low dose that after 3 months using the medication if he wants to stop the medication he can. It is not necessary to have to wean off of this medication. Advised the pt that if after stopping he noticed that sleep was better when on the medication, he can always restart the medication.  Pt agreed with the above plan and verbalized understanding.

## 2020-02-10 DIAGNOSIS — J342 Deviated nasal septum: Secondary | ICD-10-CM | POA: Diagnosis not present

## 2020-02-10 DIAGNOSIS — G4733 Obstructive sleep apnea (adult) (pediatric): Secondary | ICD-10-CM | POA: Diagnosis not present

## 2020-02-10 DIAGNOSIS — K148 Other diseases of tongue: Secondary | ICD-10-CM | POA: Diagnosis not present

## 2020-02-10 DIAGNOSIS — K1379 Other lesions of oral mucosa: Secondary | ICD-10-CM | POA: Diagnosis not present

## 2020-02-19 ENCOUNTER — Other Ambulatory Visit: Payer: Self-pay | Admitting: Family Medicine

## 2020-02-25 ENCOUNTER — Telehealth: Payer: Self-pay | Admitting: Neurology

## 2020-02-25 NOTE — Telephone Encounter (Signed)
Pt asking for the date that the order was sent to his DME for his CPAP.  Pt states he is now being told he will have to wait an additional 6-8 weeks for his CPAP (Pt reminded of the nationwide shortage) please call

## 2020-02-25 NOTE — Telephone Encounter (Signed)
Called the patient and advised that the [facility] office would be able to get him seen sooner if he is interested in going there.  Patient would definitely be interested in that.  I have made the manager aware and will message [facility] at the [facility] location so that they can get the patient worked in sooner.  Patient was appreciative for the call.

## 2020-03-02 DIAGNOSIS — G4733 Obstructive sleep apnea (adult) (pediatric): Secondary | ICD-10-CM | POA: Diagnosis not present

## 2020-03-03 ENCOUNTER — Encounter: Payer: Self-pay | Admitting: Neurology

## 2020-03-03 ENCOUNTER — Telehealth: Payer: Self-pay | Admitting: Neurology

## 2020-03-03 NOTE — Telephone Encounter (Signed)
I have reviewed this multiple times with the patient during previous calls. This is not new information. I have forwarded the patient a my chart message reminding him the steps as well.

## 2020-03-03 NOTE — Telephone Encounter (Signed)
Called pt to set up initial CPAP f/u, pt accepted appointment on 04/05 with Ward Givens, NP. Pt expressed that he did not feel this appointment was necessary. Informed pt that having a follow up was required to check compliance and answer any questions/concerns he may have about the machine. Pt stated he did not want to "waste his time" if he felt he was doing well with the machine and didn't need to come in. Pt requested a call back from the nurse. Please advise.

## 2020-03-08 ENCOUNTER — Other Ambulatory Visit: Payer: Self-pay | Admitting: Family Medicine

## 2020-03-17 ENCOUNTER — Telehealth: Payer: Self-pay

## 2020-03-17 NOTE — Telephone Encounter (Signed)
Please advise 

## 2020-03-17 NOTE — Telephone Encounter (Signed)
It looks like he is scheduled to see neurology 04/06/20- I think that would be a perfect opportunity to discuss/look at potential treatments . Is he ok waiting until that time?

## 2020-03-17 NOTE — Telephone Encounter (Signed)
Alejandro [name] is aware of your comments. He states that he will call the neuro office to see if your comments are true because he is seeing Dr. Maureen Chatters for sleep and he don't think they will see him for headaches.

## 2020-03-17 NOTE — Telephone Encounter (Signed)
Pt called [facility]. That neurologist sees him for sleeping. They told him that provider cannot see him for the vestibular migraines. [facility] told him that he needs a new referral to see someone at [name] for the migraines. Pt is wanting this referral sent in ASAP.

## 2020-03-17 NOTE — Telephone Encounter (Signed)
Pt has been experiencing motion sickness/ vestibular migraines for awhile now. States he has seen Dr. Yong Channel for it. Pt is wanting to get medication prescribed for vestibular migraines. He wants to know if this is something Dr. Yong Channel can address or if he needs to be referred elsewhere. Pt has already seen an ENT and states that was of no help. Please advise.

## 2020-03-18 ENCOUNTER — Other Ambulatory Visit: Payer: Self-pay

## 2020-03-18 DIAGNOSIS — R42 Dizziness and giddiness: Secondary | ICD-10-CM

## 2020-03-18 NOTE — Telephone Encounter (Signed)
May place referral under dizziness-please let Lattie Haw know and try to have this processed as soon as possible

## 2020-03-18 NOTE — Telephone Encounter (Signed)
Referral has been placed and he's aware!

## 2020-03-18 NOTE — Addendum Note (Signed)
Addended by: Marin Olp on: 03/18/2020 12:28 PM   Modules accepted: Orders

## 2020-03-19 ENCOUNTER — Ambulatory Visit: Payer: BC Managed Care – PPO | Admitting: Physician Assistant

## 2020-04-02 DIAGNOSIS — G4733 Obstructive sleep apnea (adult) (pediatric): Secondary | ICD-10-CM | POA: Diagnosis not present

## 2020-04-06 ENCOUNTER — Ambulatory Visit: Payer: BC Managed Care – PPO | Admitting: Adult Health

## 2020-04-06 ENCOUNTER — Ambulatory Visit: Payer: BC Managed Care – PPO | Admitting: Neurology

## 2020-04-12 IMAGING — CT CT HEAD W/O CM
3 series · 15 of 44 positions shown, 18 images · non-contrast
Comparison: None.

CLINICAL DATA: Syncopal episode 2 days ago with posterior head
trauma, initial encounter

EXAM:
CT HEAD WITHOUT CONTRAST
TECHNIQUE: Contiguous axial images were obtained from the base of the skull
through the vertex without intravenous contrast.

[Series 2: head 5.0 h37s · axial · 0.41mm/px · z∈[+156,+266]mm · 9 of 27 slices shown, 12 images]
[im 3/27  brain]
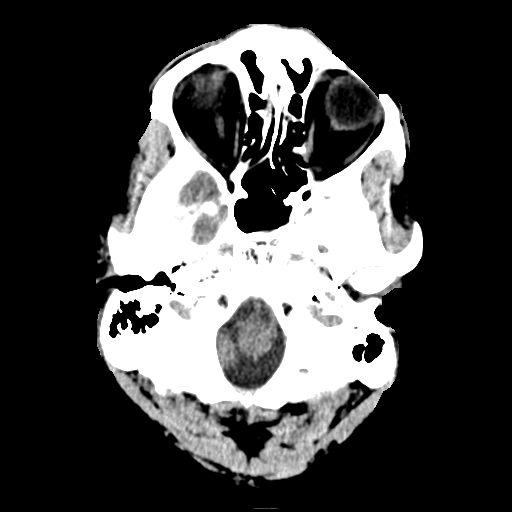
[im 3/27  bone]
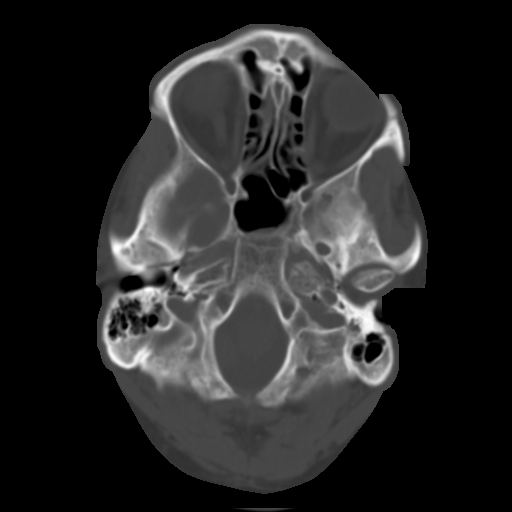
[im 6/27  brain]
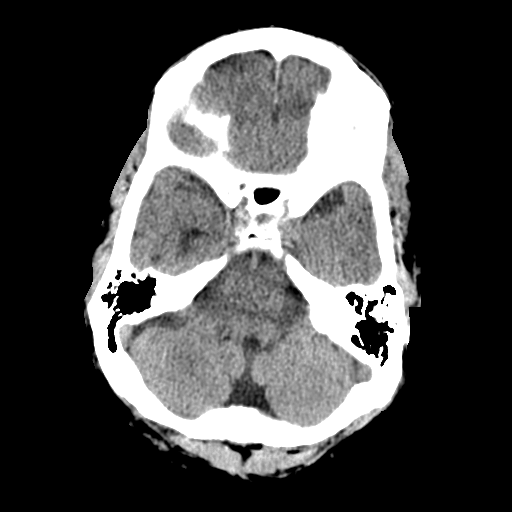
[im 8/27  brain]
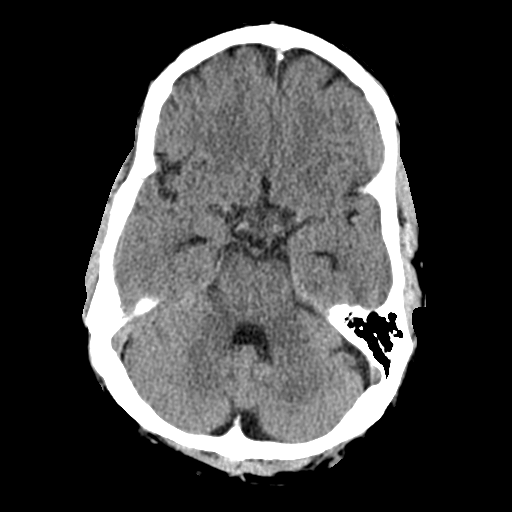
[im 11/27  brain]
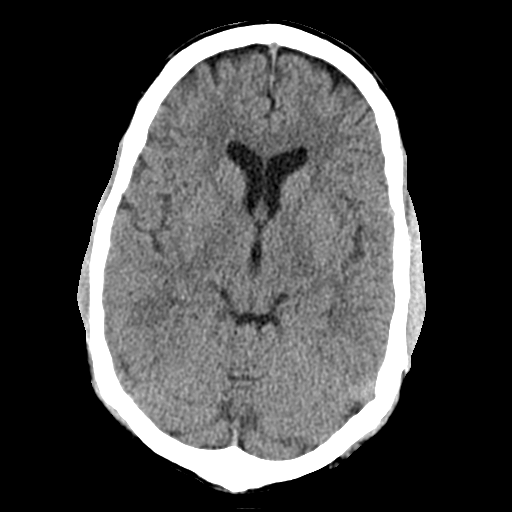
[im 14/27  brain]
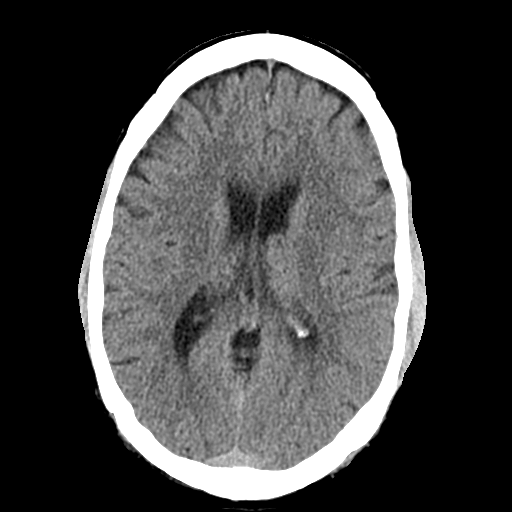
[im 14/27  bone]
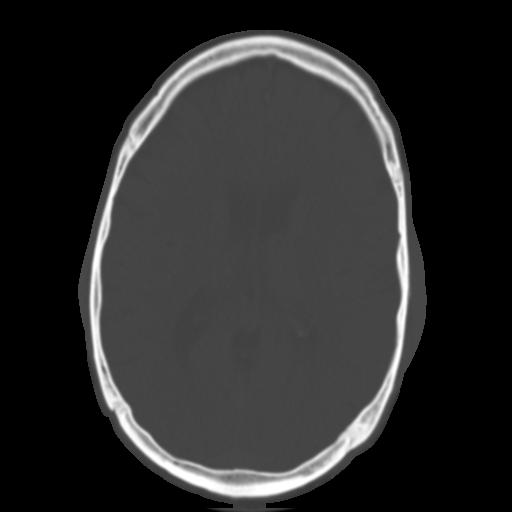
[im 17/27  brain]
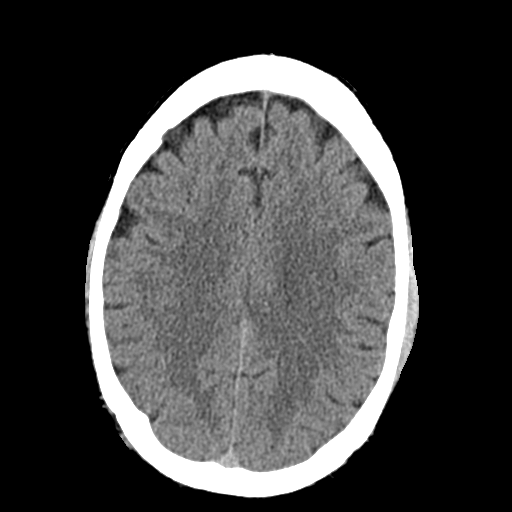
[im 20/27  brain]
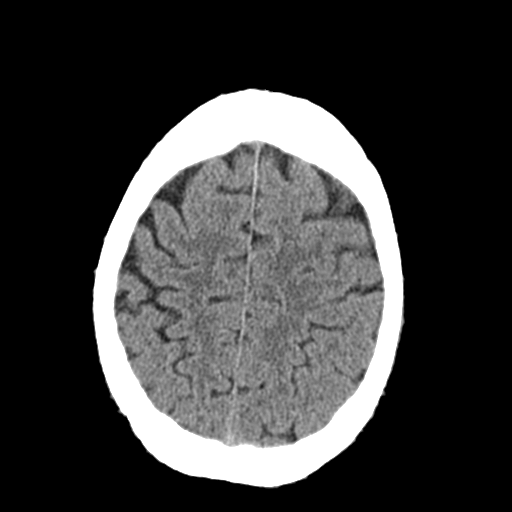
[im 22/27  brain]
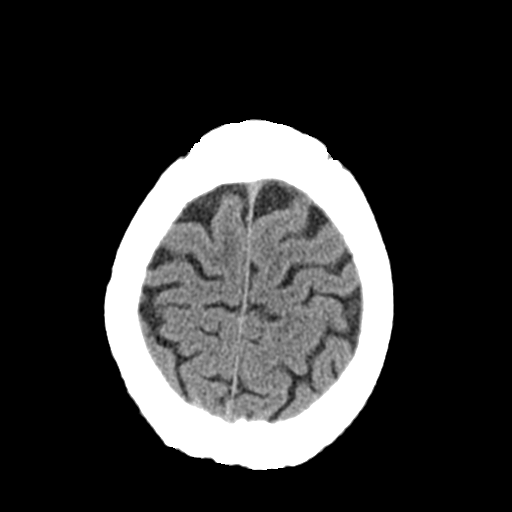
[im 25/27  brain]
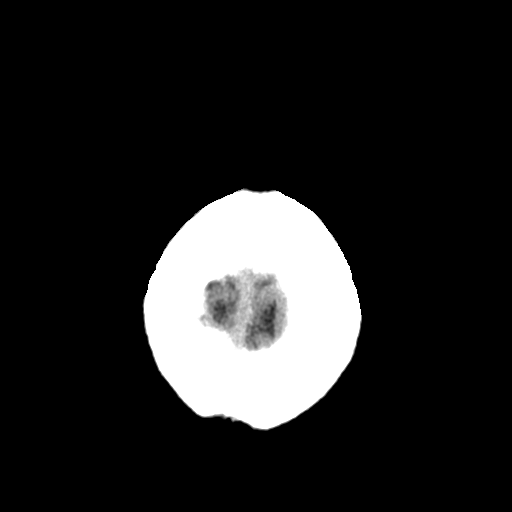
[im 25/27  bone]
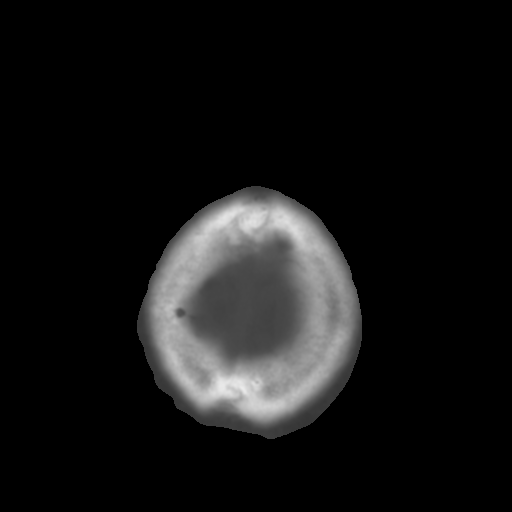

[Series 4: head 3.0 mpr coronal · coronal · 0.27mm/px · 3 of 66 slices shown]
[im 22/66  brain]
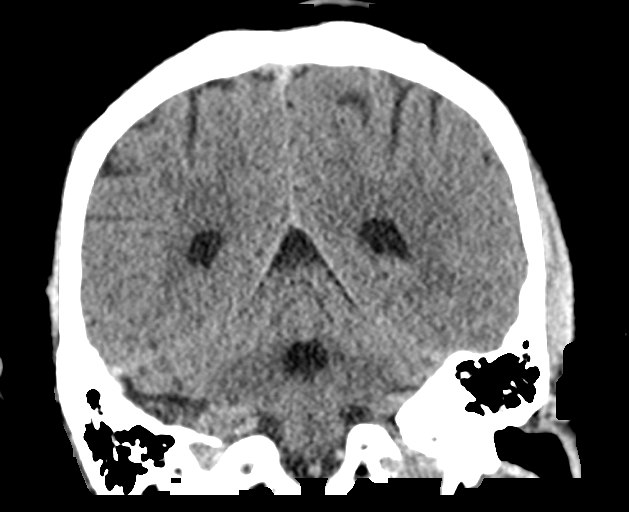
[im 29/66  brain]
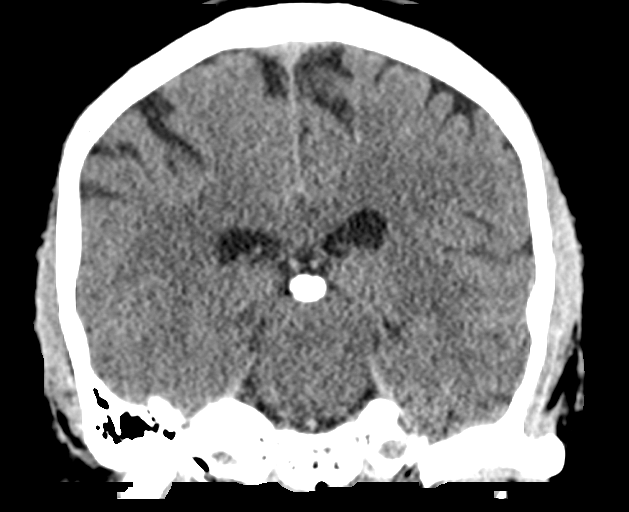
[im 37/66  brain]
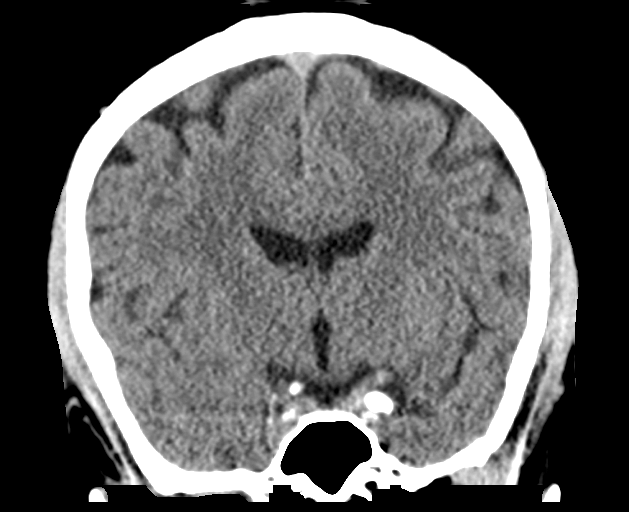

[Series 5: head 3.0 mpr sagittal · sagittal · 0.29mm/px · 3 of 54 slices shown]
[im 18/54  brain]
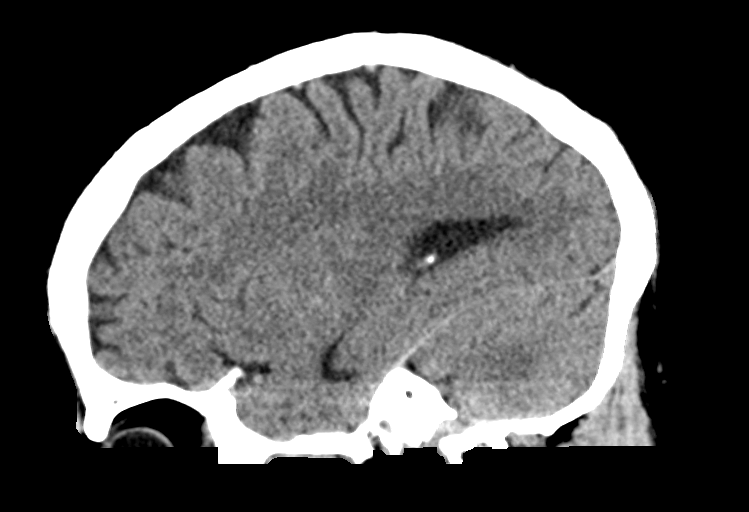
[im 27/54  brain]
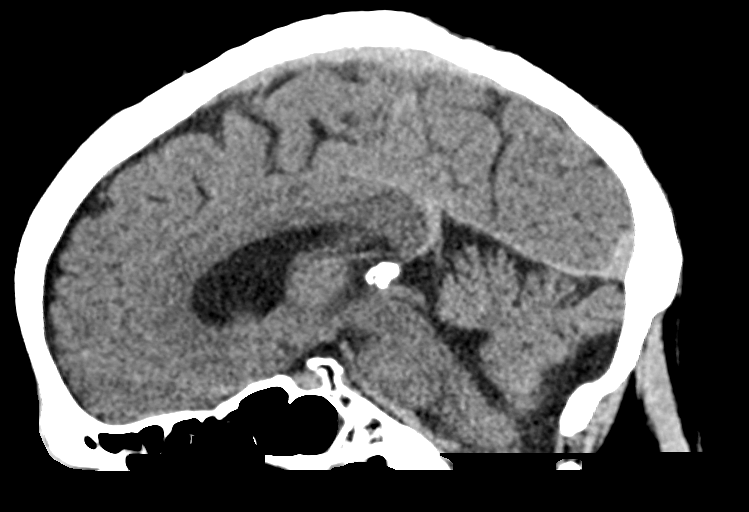
[im 36/54  brain]
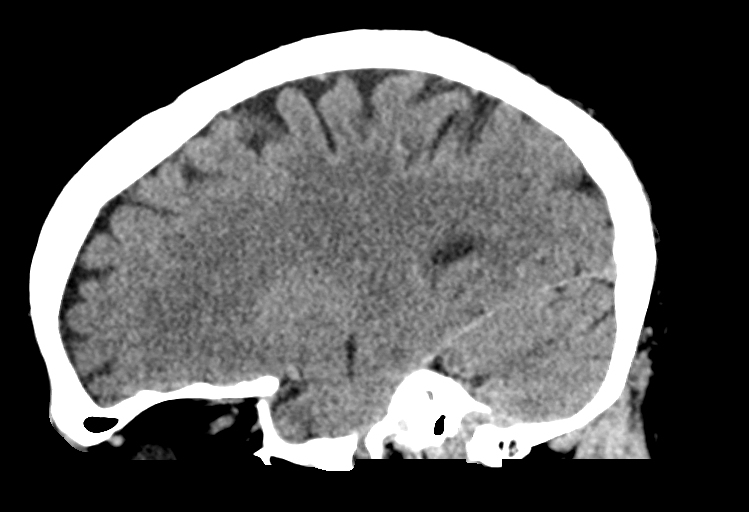

[15 of 44 positions shown; findings below may reference images not displayed]

FINDINGS: Brain: Mild atrophic changes are noted. No findings to suggest acute
hemorrhage, acute infarction or space-occupying mass lesion are
noted.

Vascular: No hyperdense vessel or unexpected calcification.

Skull: Normal. Negative for fracture or focal lesion.

Sinuses/Orbits: No acute finding.

Other: None.
IMPRESSION: Mild atrophic changes without acute abnormality.

## 2020-04-14 ENCOUNTER — Telehealth: Payer: Self-pay | Admitting: Neurology

## 2020-04-14 NOTE — Telephone Encounter (Signed)
Pt called and the message from [place], [place] was read to him.  This is Pharmacist, hospital

## 2020-04-14 NOTE — Telephone Encounter (Signed)
The patient there was no answer.  Left a detailed message advising the patient to bring his machine and power cord to his visit scheduled tomorrow.  Patient is coming for initial CPAP in the machine he is on will require [place] to download the machine.    If patient returns call please reiterate to bring his machine and power cord.

## 2020-04-15 ENCOUNTER — Telehealth: Payer: Self-pay | Admitting: Neurology

## 2020-04-15 ENCOUNTER — Other Ambulatory Visit: Payer: Self-pay

## 2020-04-15 ENCOUNTER — Ambulatory Visit: Payer: BC Managed Care – PPO | Admitting: Neurology

## 2020-04-15 ENCOUNTER — Encounter: Payer: Self-pay | Admitting: Neurology

## 2020-04-15 ENCOUNTER — Other Ambulatory Visit: Payer: Self-pay | Admitting: Neurology

## 2020-04-15 VITALS — BP 128/78 | HR 77 | Ht 65.5 in | Wt 153.0 lb

## 2020-04-15 DIAGNOSIS — R1115 Cyclical vomiting syndrome unrelated to migraine: Secondary | ICD-10-CM | POA: Diagnosis not present

## 2020-04-15 DIAGNOSIS — G4700 Insomnia, unspecified: Secondary | ICD-10-CM | POA: Insufficient documentation

## 2020-04-15 DIAGNOSIS — G4733 Obstructive sleep apnea (adult) (pediatric): Secondary | ICD-10-CM | POA: Diagnosis not present

## 2020-04-15 DIAGNOSIS — T753XXA Motion sickness, initial encounter: Secondary | ICD-10-CM | POA: Insufficient documentation

## 2020-04-15 DIAGNOSIS — H832X9 Labyrinthine dysfunction, unspecified ear: Secondary | ICD-10-CM

## 2020-04-15 DIAGNOSIS — G478 Other sleep disorders: Secondary | ICD-10-CM

## 2020-04-15 MED ORDER — ALPRAZOLAM 0.25 MG PO TABS: ORAL_TABLET | ORAL | 1 refills | Status: DC

## 2020-04-15 MED ORDER — ALPRAZOLAM 0.25 MG PO TABS: 0.2500 mg | ORAL_TABLET | Freq: Every day | ORAL | 1 refills | Status: AC | PRN

## 2020-04-15 NOTE — Telephone Encounter (Signed)
[id number] has called to verify a prescription for pt, please call them at [phone]

## 2020-04-15 NOTE — Progress Notes (Signed)
SLEEP MEDICINE CLINIC    Provider:  Larey Seat, MD  Primary Care Physician:  Marin Olp, [place] [address]     Referring Provider: Marin Olp, [address] [place],  [place] [address]          Chief Complaint according to patient   Patient presents with:    . New Patient (Initial Visit)     pt alone, rm 10. presents today for concerns of not sleeping well. he had a SS c/o here 7+ yrs ago, which had indicated mixed apnea.  He started CPAP and tried it for 95yr and barely unable to tolerate, finally stopped. New is insomnia, 'the night lasts forever" and started after his mother's death last year.  Now repeated sleep study w showed severe apnea . He has a new machine , much more quiet.       HISTORY OF PRESENT ILLNESS:  Alejandro [name] is a 60 year- old Caucasian male patient and her for a RV on 04/15/2020. His PSG still showed the presence of sleep apnea, and he has still insomnia, in spite of taking Zoloft.  Alejandro [name]  has a past medical history of Allergy, Central sleep apnea, Colon cancer ([place]), Complex sleep apnea syndrome, Complication of anesthesia, GERD (gastroesophageal reflux disease), HEMORRHOIDS-INTERNAL (10/01/2007), High cholesterol, PONV (postoperative nausea and vomiting), Psoriasis, and Right elbow pain (08/19/2015).   Mr. Alejandro [name] tested on 12-18-19 and he had a total sleep time of 260 minutes at night, delayed sleep latency was 14 minutes, he was able to gather 20% of REM sleep and stage N3 which is slow-wave sleep sleep was 12.3% of the recorded night.  His AHI was 39.4 and actually not accentuated in rem.  There was a great difference between supine sleep and nonsupine sleep supine AHI was 64.3 nonsupine 22.7 he had 51 minutes spent below 89% oxygen saturation nadir was 72%.  This is a complex sleep apnea mostly hypopnea of mostly obstructive in origin.  Be therefore suggested CPAP use again that the  patient has been working with his new machine which is a lunar device I looked at the last 30 days he has on average use the machine 3 hours 3 minutes maximum usage was 4 hours 32 minutes however he has only 6.7% of the time being able to be used more than 4 hours so he is currently in a phase of desensitization he is getting used to the machine this is his first month with the machine, due to supply chain problems he was not able to get the machine and time earlier.  The mean pressure on 95th percentile pressure 10.8 cmH2O and the daily average air leak was only 40 minutes. We have less than 30 days of data now-   So his average residual central AI was 2.8 and he has about 3 obstructive events per hour.    What I would like to do is to change him to a mask that he described as likely more comfortable for him he has tried a full facemask with either an F 30 I or a DreamWear attachment and we switched today to just a nasal pillow or nasal cradle F 30 I. He was fitted here, in office.  He likes that the tube was on top and it does not restrict the ability to sleep on the side or even prone.  So I think he has made great effort and not  to use the machine for a longer time and I am optimistic that he will come to 4 hours plus each night. With the new mask he may actually be able to have a shorter sleep latency as well the insomnia is not primarily related to his apnea it is a separate issue.  He does not feel rested or restored and often feels that he has slept less than he may on record have slept.  Further interval history- blurred vision, He has had an evaluation for vertigo, nausea, dizziness when bending forward, was seen by Dr. Benjamine Mola and [facility] , was told he doesn't have vertigo.  Vistibular migraine? This was raised by PT in vestibular rehab. When bending down he gets hot and clammy and nauseated, hands and feet feel cold and sweaty.    He stopped taking Omeprazole and started a supplement, probiotics,  antihistamines and feels better- but still has a long time to fall asleep- this is primary insomnia , not related to apnea.    Was seen here in a consultation on 12-18-2019:   requested by Dr. Ansel Bong.  Chief concern according to patient :  'my mother struggled with interstittial lung disease, finbrosis and died with shortness of breath- a sickness than progressed over 1 final year". I has similar reaction to a close friends death by suicide in 2002-04-13, used xanax for 3 month".    The patient had the first sleep study in the year 2012-04-12 with a result of mixed, somewhat complex apnea .   Sleep relevant medical history: history of untreated sleep apnea. Nightmares, insomnia. Used to dream he was falling, woke up screaming. Family medical /sleep history: No other family member on CPAP with OSA, insomnia, no sleep walkers.    Social history:  Patient is working as Biochemist, clinical- low stress- no travel now,  and lives in a household with spouse, has 2 grown daughters. Younger one is aloof.  The patient currently works in daytime.Pets are present. Tobacco use- never. ETOH use : rare ,  Caffeine intake in form of Coffee( /) Soda( very little ) Tea ( not daily) or energy drinks. Regular exercise; tennis, golf.  Hobbies : walking, hiking.    Sleep habits are as follows: The patient's dinner time is between 7 PM. The patient goes to bed at 11.30 PM and struggles to sleep-once asleep, he continues to sleep for 5-6 hours, but wakes for 1 bathroom break.  Bowel movements  at 4-5 AM.  The preferred sleep position is either laterally or supine  , with the support of one pillow. Flat bed. GERD treated with Omeprazole.  Dreams are reportedly frequent/vivid. 5.30-6 AM is the usual rise time. The patient wakes up spontaneously.  He  reports not feeling refreshed or restored in AM, with symptoms such as residual fatigue.  Naps are not taken : l can't nap even if I wanted to"   Review of Systems: Out of a complete 14  system review, the patient complains of only the following symptoms, and all other reviewed systems are negative.:     Fatigue, sleepiness  fragmented sleep, Insomnia - trouble to go to sleep, sleep perception may be affected.   Possible hypoglycemia, Hydration ?   Vestibular sensitivity. " I can't look out the side of a window, I throw up" Cyclic vomiting.   No headaches.         How likely are you to doze in the following situations: 0 = not likely, 1 = slight  chance, 2 = moderate chance, 3 = high chance   Sitting and Reading? Watching Television? Sitting inactive in a public place (theater or meeting)? As a passenger in a car for an hour without a break? Lying down in the afternoon when circumstances permit? Sitting and talking to someone? Sitting quietly after lunch without alcohol? In a car, while stopped for a few minutes in traffic?   Total = 15/ 24 points   FSS endorsed at 35/ 63 points.   Social History   Socioeconomic History  . Marital status: Married    Spouse name: Not on file  . Number of children: 2  . Years of education: BS  . Highest education level: Not on file  Occupational History    Comment: [employer]  Tobacco Use  . Smoking status: Never Smoker  . Smokeless tobacco: Never Used  Vaping Use  . Vaping Use: Never used  Substance and Sexual Activity  . Alcohol use: Yes    Alcohol/week: 3.0 - 4.0 standard drinks    Types: 3 - 4 Cans of beer per week    Comment: 3 - 4 beers during some weeks   . Drug use: No  . Sexual activity: Yes  Other Topics Concern  . Not on file  Social History Narrative   Married. 2 grown children- 18 (cosemetology school) and 22 ([employer] grad 2018) in 2018      BA at [employer] major. 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 Sleep disorder Father   . Hyperlipidemia Father   . Sleep disorder Paternal Grandfather   . Heart disease Paternal Grandfather   . Cancer Paternal Grandmother        breast  . Breast cancer Mother   . Alcohol abuse Mother   . Eating disorder Mother   . Diabetes Maternal Grandmother   . Heart disease Maternal Grandfather        late life  . Hyperlipidemia Maternal Grandfather   . Stroke Maternal Grandfather        late life    Past Medical History:  Diagnosis Date  . Allergy   . Central sleep apnea    AHI of 44 exacerbated to 52 and failed CPAP  . Colon cancer ([place])    polpy removed from colon that was cancerous  . Complex sleep apnea syndrome    baseline AHI of 44 on 04-21-11, changed to adapt SV    . Complication of anesthesia    Patient has hard time waking up  . GERD (gastroesophageal reflux disease)   . HEMORRHOIDS-INTERNAL 10/01/2007   Qualifier: Diagnosis of  By: Fuller Plan MD Marijo Conception High cholesterol   . PONV (postoperative nausea and vomiting)   . Psoriasis   . Right elbow pain 08/19/2015    Past Surgical History:  Procedure Laterality Date  . COLONOSCOPY  2009   CANCEROUS POLYPS REMOVED  . KNEE ARTHROSCOPY Left 1979  . KNEE ARTHROSCOPY Right 08/22/2012   Procedure: RIGHT KNEE ARTHROSCOPY WITH DEBRIDEMENT ;  Surgeon: Marin Shutter,  MD;  Location: [facility];  Service: Orthopedics;  Laterality: Right;  . WRIST FRACTURE SURGERY Left ~ 1995     Current Outpatient Medications on File Prior to Visit  Medication Sig Dispense Refill  . ALPRAZolam (XANAX) 0.25 MG tablet Take 1 tablet (0.25 mg total) by mouth at bedtime as needed for anxiety. 30 tablet 0  . atorvastatin (LIPITOR) 40 MG tablet Take 1 tablet by mouth once daily 90 tablet 0  .  Calcipotriene 0.005 % solution APPLY  SOLUTION TOPICALLY TWICE DAILY 60 mL 0  . clotrimazole-betamethasone (LOTRISONE) cream APPLY 1 APPLICATION TOPICALLY 2 TIMES DAILY 30 g 0  . meclizine (ANTIVERT) 12.5 MG tablet Take 1 tablet (12.5 mg total) by mouth 3 (three) times daily as needed for dizziness (or motion sickness). Do not take  with scopolamine (Patient taking differently: Take 12.5 mg by mouth 3 (three) times daily as needed for dizziness (or motion sickness). Do not take  with scopolamine) 30 tablet 1  . ondansetron (ZOFRAN) 4 MG tablet Take 4 mg by mouth every 8 (eight) hours as needed for nausea or vomiting.    Marland Kitchen scopolamine (TRANSDERM-SCOP) 1 MG/3DAYS Place 1 patch (1.5 mg total) onto the skin every 3 (three) days as needed (For motion sickness). 10 patch 0  . sertraline (ZOLOFT) 25 MG tablet Take 1 tablet (25 mg total) by mouth at bedtime. 30 tablet 5   No current facility-administered medications on file prior to visit.    Allergies  Allergen Reactions  . Cortisone   . Depo-Medrol [Methylprednisolone Sodium Succ] Other (See Comments)    Blurry vision- blisters behind retinas  . Other Other (See Comments)    Does/t likt narcotics and cannot take steroids  Blurry vision- blisters behind retinas   . Codeine Other (See Comments)    REACTION: nausea, vomiting ABD PAIN REACTION: nausea, vomiting    Physical exam:  Today's Vitals   04/15/20 1343  BP: 128/78  Pulse: 77  Weight: 153 lb (69.4 kg)  Height: 5' 5.5" (1.664 m)   Body mass index is 25.07 kg/m.   Wt Readings from Last 3 Encounters:  04/15/20 153 lb (69.4 kg)  11/18/19 160 lb (72.6 kg)  10/21/19 161 lb 12.8 oz (73.4 kg)     Ht Readings from Last 3 Encounters:  04/15/20 5' 5.5" (1.664 m)  11/18/19 5\' 5"  (1.651 m)  10/21/19 5\' 5"  (1.651 m)      General: The patient is awake, alert and appears not in acute distress. The patient is well groomed. Head: Normocephalic, atraumatic.  Neck is supple. Mallampati 2,   neck circumference:16 inches .  Nasal airflow congested- deviated septum -  Retrognathia is mild .  Dental status: intact  Cardiovascular:  Regular rate and cardiac rhythm by pulse,  without distended neck veins. Respiratory: Lungs are clear to auscultation.  Skin:  Without evidence of ankle edema, or rash. Trunk: The patient's posture is erect.   Neurologic exam : The patient is awake and alert, oriented to place and time.   Memory subjective described as intact.  Attention span & concentration ability appears normal.  Speech is fluent,  without  dysarthria, dysphonia or aphasia.  Mood and affect are appropriate.   Cranial nerves: no loss of smell or taste reported  Pupils are equal and briskly reactive to light. Funduscopic exam deferred.  Extraocular movements in vertical and horizontal planes were intact and with endpoint nystagmus only on the left . No Diplopia. Visual fields by finger perimetry are  intact. Hearing was intact to soft voice and finger rubbing.    Facial sensation intact to fine touch.  Facial motor strength is symmetric and tongue and uvula move midline.  Neck ROM : rotation, tilt and flexion extension were normal for age and shoulder shrug was symmetrical.  He reports frequent neck pain, forcing him to sleep more supine.    Motor exam:  Symmetric bulk, tone and ROM.   Normal tone without cog wheeling, symmetric grip strength .   Sensory: Vibration sense remains normal.  Proprioception tested in the upper extremities was normal.   Coordination: Rapid alternating movements in the fingers/hands were of normal speed.  The Finger-to-nose maneuver was intact without evidence of ataxia, dysmetria or tremor.   Gait and station: Patient could rise unassisted from a seated position, walked without assistive device.  Stance is of normal width/ base and the patient turned with 3 steps.  Toe and heel walk were deferred.  Deep tendon reflexes: in the  upper and lower  extremities are symmetric and intact.  Babinski response was deferred.      After spending a total time of 45 minutes face to face and additional time for physical and neurologic examination, review of laboratory studies,  personal review of imaging studies, reports and results of other testing and review of referral information / records as far as provided in visit, I have established the following assessments:  Insomnia and apnea, working up to 4 hours or more with a new CPAP interface.  Vestibular concerns, trying a low dose xanax 0.25 mg for motion sickness problems,prn.     My Plan is to proceed with:  1) we had tried Trazodone in 2013 , he didn't like it. I  chose an SSRI, Zoloft, Lexapro, etc. I increased Zoloft for 50 mg.   2) CPAP has been working in reduction of apnea. He changed to a nasal pillow ResMed P30! M.  3) try Xanax 3.22 mg for cyclic vomiting-     I would like to thank Marin Olp, [facility] [place],  [place] [address] for allowing me to meet with and to take care of this pleasant patient.   In short, Alejandro [name] is presenting with sleep initiation insomnia and untreated apnea.  I plan to follow up either personally or through our NP within 3 month.   CC: I will share my notes with PCP.  Electronically signed by: Larey Seat, MD 04/15/2020 2:06 PM  [facility] Board certified by The [employer] of Sleep Medicine and Diplomate of the [employer] of Sleep Medicine. Board certified In Neurology through the [employer], Fellow of the [employer] of Neurology. Medical Director of [facility].

## 2020-04-15 NOTE — Patient Instructions (Signed)
Cyclic Vomiting Syndrome, Adult Cyclic vomiting syndrome (CVS) is a condition that causes episodes of severe nausea and vomiting. It can last for hours or even days. Attacks may occur several times a month or several times a year. Between episodes of CVS, you may be otherwise healthy. What are the causes? The cause of this condition is not known. Although many of the episodes can happen for no obvious reason, you may have specific CVS triggers. Episodes may be triggered by:  An infection, especially colds and the flu.  Emotional stress, including excitement or anxiety about upcoming events, such as school, parties, or travel.  Certain foods or beverages, such as chocolate, cheese, alcohol, and food additives.  Motion sickness.  Eating a large meal before bed.  Being very tired.  Being too hot. What increases the risk? You are more likely to develop this condition if:  You get migraine headaches.  You have a family history of motion sickness,  CVS or migraine headaches. What are the signs or symptoms? Symptoms tend to happen at the same time of day, and each episode tends to last about the same amount of time. Symptoms commonly start at night or when you wake up. Many people have warning signs (prodrome) before an episode, which may include slight nausea, sweating, and pale skin (pallor). The most common symptoms of a CVS attack include:  Severe vomiting. Vomiting may happen every 5-15 minutes.  Severe nausea.  Gagging (retching). Other symptoms may include:  Headache.  Dizziness.  Sensitivity to light.  Extreme thirst.  Abdominal pain. This can be severe.  Loose stools or diarrhea.  Fever.  Pale skin (pallor), especially on the face.  Weakness.  Exhaustion.  Sleepiness after a CVS episode.  Dehydration. This can cause: ? Thirst. ? Dry mouth. ? Decreased urination. ? Fatigue. How is this diagnosed? This condition may be diagnosed based on your symptoms,  medical history, and family history of CVS or migraine. Your health care provider will ask whether you have had:  Episodes of severe nausea and vomiting that have happened a total of 5 or more times, or 3 or more times in the past 6 months.  Episodes that last for 1 hour or more, and occur 1 week apart or farther apart.  Episodes that are similar each time.  Normal health between episodes. Your health care provider will also do a physical exam. To rule out other conditions, you may have tests, such as:  Blood tests.  Urine tests.  Imaging tests. How is this treated? There is no cure for this condition, but treatment can help manage or prevent CVS episodes. Work with your health care provider to find the best treatment for you. Treatment may include:  Avoiding stress and CVS triggers.  Eating smaller, more frequent meals.  Taking medicines, such as: ? Over-the-counter pain medicine. ? Anti-nausea medicines. ? Antacids. ? Antihistamines. ? Medicines for migraines. ? Antidepressants. ? Antibiotics. Severe nausea and vomiting may require you to stay at the hospital. You may need IV fluids to prevent or treat dehydration.   Follow these instructions at home: During an episode  Take over-the-counter and prescription medicines only as told by your health care provider.  Stay in bed and rest in a dark, quiet room. After an episode  Drink an oral rehydration solution (ORS), if directed by your health care provider. This is a drink that helps you replace fluids and the salts and minerals in your blood (electrolytes). It can be found at  pharmacies and retail stores.  Drink small amounts of clear fluids slowly and gradually add more. ? Drink clear fluids such as water or fruit juice that has water added (is diluted). You may also eat low-calorie popsicles. ? Avoid drinking fluids that contain a lot of sugar or caffeine, such as sports drinks and soda.  Eat soft foods in small  amounts every 3-4 hours. Eat your regular diet, but avoid spicy or fatty foods, such as french fries and pizza.   General instructions  Monitor your condition for any changes.  If you were prescribed an antibiotic medicine, take it as told by your health care provider. Do not stop taking the antibiotic even if you start to feel better.  Keep track of your attacks and symptoms, and pay attention to any triggers. Avoid those triggers when you can.  Keep all follow-up visits as told by your health care provider. This is important. Contact a health care provider if:  Your condition gets worse.  You cannot drink fluids without vomiting.  You have pain and trouble swallowing after an episode. Get help right away if:  You have blood in your vomit.  Your vomit looks like coffee grounds.  You have stools that are bloody or black, or stools that look like tar.  You have signs of dehydration, such as: ? Sunken eyes. ? Not making tears while crying. ? Very dry mouth. ? Cracked lips. ? Decreased urine production. ? Dark urine. Urine may be the color of tea. ? Weakness. ? Sleepiness. Summary  Cyclic vomiting syndrome (CVS) causes episodes of severe nausea and vomiting that can last for hours or even days.  Vomiting and diarrhea can make you feel weak and can lead to dehydration. If you notice signs of dehydration, call your health care provider right away.  Treatment can help you manage or prevent CVS episodes. Work with your health care provider to find the best treatment for you.  Keep all follow-up visits as told by your health care provider. This is important. This information is not intended to replace advice given to you by your health care provider. Make sure you discuss any questions you have with your health care provider. Document Revised: 09/24/2018 Document Reviewed: 02/04/2016 Elsevier Patient Education  2021 [employer].

## 2020-04-15 NOTE — Telephone Encounter (Signed)
have corrected this prescription and we sent to the pharmacy for the patient with a cover sheet stating it is an updated prescription.

## 2020-04-27 ENCOUNTER — Encounter: Payer: Self-pay | Admitting: Neurology

## 2020-04-28 ENCOUNTER — Other Ambulatory Visit: Payer: Self-pay | Admitting: Neurology

## 2020-04-28 MED ORDER — SERTRALINE HCL 50 MG PO TABS: 50.0000 mg | ORAL_TABLET | Freq: Every day | ORAL | 5 refills | Status: DC

## 2020-05-02 DIAGNOSIS — G4733 Obstructive sleep apnea (adult) (pediatric): Secondary | ICD-10-CM | POA: Diagnosis not present

## 2020-06-02 DIAGNOSIS — G4733 Obstructive sleep apnea (adult) (pediatric): Secondary | ICD-10-CM | POA: Diagnosis not present

## 2020-06-08 DIAGNOSIS — D225 Melanocytic nevi of trunk: Secondary | ICD-10-CM | POA: Diagnosis not present

## 2020-06-08 DIAGNOSIS — D2262 Melanocytic nevi of left upper limb, including shoulder: Secondary | ICD-10-CM | POA: Diagnosis not present

## 2020-06-08 DIAGNOSIS — L400 Psoriasis vulgaris: Secondary | ICD-10-CM | POA: Diagnosis not present

## 2020-06-08 DIAGNOSIS — D2372 Other benign neoplasm of skin of left lower limb, including hip: Secondary | ICD-10-CM | POA: Diagnosis not present

## 2020-06-22 ENCOUNTER — Encounter: Payer: Self-pay | Admitting: Neurology

## 2020-06-24 ENCOUNTER — Ambulatory Visit: Payer: BC Managed Care – PPO | Admitting: Physician Assistant

## 2020-06-24 ENCOUNTER — Other Ambulatory Visit: Payer: Self-pay

## 2020-06-24 ENCOUNTER — Encounter: Payer: Self-pay | Admitting: Physician Assistant

## 2020-06-24 VITALS — BP 118/76 | HR 64 | Temp 97.9°F | Ht 65.5 in | Wt 158.5 lb

## 2020-06-24 DIAGNOSIS — G250 Essential tremor: Secondary | ICD-10-CM | POA: Diagnosis not present

## 2020-06-24 NOTE — Progress Notes (Signed)
Acute Office Visit  Subjective:    Patient ID: Alejandro [name], male    DOB: August 24, 1960, 60 y.o.   MRN: [id number]  Chief Complaint  Patient presents with   Tremors    HPI Patient is in today for tremors in the hands x two years. He is right hand dominant and notices it more in his right hand. Worse if he is holding his phone up to his ear or goes to eat with a fork. Does not happen all the time. Does not affect his ADL's. Denies alcohol use. No new medications. No history of Parkinson's Disease in his family.  Sees a [facility] for his sleep apnea and next appointment is July 26th - states he needs referral to be seen for tremors as well.   Past Medical History:  Diagnosis Date   Allergy    Central sleep apnea    AHI of 44 exacerbated to 52 and failed CPAP   Colon cancer (HCC)    polpy removed from colon that was cancerous   Complex sleep apnea syndrome    baseline AHI of 44 on 04-21-11, changed to adapt SV     Complication of anesthesia    Patient has hard time waking up   GERD (gastroesophageal reflux disease)    HEMORRHOIDS-INTERNAL 10/01/2007   Qualifier: Diagnosis of  By: Fuller Plan MD Marijo Conception    High cholesterol    PONV (postoperative nausea and vomiting)    Psoriasis    Right elbow pain 08/19/2015    Past Surgical History:  Procedure Laterality Date   COLONOSCOPY  2009   CANCEROUS POLYPS REMOVED   KNEE ARTHROSCOPY Left 1979   KNEE ARTHROSCOPY Right 08/22/2012   Procedure: RIGHT KNEE ARTHROSCOPY WITH DEBRIDEMENT ;  Surgeon: Marin Shutter, MD;  Location: [facility];  Service: Orthopedics;  Laterality: Right;   WRIST FRACTURE SURGERY Left ~ 1995    Family History  Problem Relation Age of Onset   Sleep disorder Father    Hyperlipidemia Father    Sleep disorder Paternal Grandfather    Heart disease Paternal Grandfather    Cancer Paternal Grandmother        breast   Breast cancer Mother    Alcohol abuse Mother    Eating disorder Mother    Diabetes  Maternal Grandmother    Heart disease Maternal Grandfather        late life   Hyperlipidemia Maternal Grandfather    Stroke Maternal Grandfather        late life    Social History   Socioeconomic History   Marital status: Married    Spouse name: Not on file   Number of children: 2   Years of education: BS   Highest education level: Not on file  Occupational History    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 Prn for vestibular/ cyclic vomiting. 30 tablet 1   atorvastatin (LIPITOR) 40 MG tablet Take 1 tablet by mouth once daily 90 tablet 0   Calcipotriene 0.005 % solution APPLY  SOLUTION TOPICALLY TWICE DAILY 60 mL 0   clotrimazole-betamethasone (LOTRISONE) cream APPLY 1 APPLICATION TOPICALLY 2 TIMES DAILY 30 g 0   meclizine (ANTIVERT)  12.5 MG tablet Take 1 tablet (12.5 mg total) by mouth 3 (three) times daily as needed for dizziness (or motion sickness). Do not take  with scopolamine (Patient taking differently: Take 12.5 mg by mouth 3 (three) times daily as needed for dizziness (or motion sickness). Do not take  with scopolamine) 30 tablet 1   ondansetron (ZOFRAN) 4 MG tablet Take 4 mg by mouth every 8 (eight) hours as needed for nausea or vomiting.     scopolamine (TRANSDERM-SCOP) 1 MG/3DAYS Place 1 patch (1.5 mg total) onto the skin every 3 (three) days as needed (For motion sickness). 10 patch 0   sertraline (ZOLOFT) 50 MG tablet Take 1 tablet (50 mg total) by mouth at bedtime. 30 tablet 5   No facility-administered medications prior to visit.    Allergies  Allergen Reactions   Cortisone    Depo-Medrol [Methylprednisolone Sodium Succ] Other (See Comments)    Blurry vision- blisters behind retinas   Other Other (See Comments)    Does/t likt narcotics and cannot take steroids  Blurry vision- blisters behind retinas    Codeine Other (See Comments)    REACTION: nausea, vomiting ABD PAIN REACTION: nausea, vomiting    Review of Systems  Constitutional:  Negative for activity change, appetite change, fatigue and unexpected weight change.  HENT:  Negative for trouble swallowing and voice change.   Musculoskeletal:  Negative for joint swelling.  Neurological:  Positive for tremors. Negative for dizziness, seizures, syncope, facial asymmetry, speech difficulty, weakness, light-headedness, numbness and headaches.  Psychiatric/Behavioral:  Negative for behavioral problems, confusion and hallucinations. The patient is not nervous/anxious.       Objective:    Physical Exam Neurological:     General: No focal deficit present.     Mental Status: He is alert and oriented to person, place, and time.     Cranial Nerves: Cranial nerves are intact.     Sensory: Sensation is intact.     Motor: Tremor (slight right hand tremor  when patient holds phone to ear. No resting tremor present. No tremor when making a fist.) present. No weakness, atrophy, abnormal muscle tone, seizure activity or pronator drift.     Coordination: Coordination is intact.     Gait: Gait is intact.     Deep Tendon Reflexes: Reflexes are normal and symmetric.    BP 118/76   Pulse 64   Temp 97.9 F (36.6 C)   Ht 5' 5.5" (  1.664 m)   Wt 158 lb 8 oz (71.9 kg)   SpO2 95%   BMI 25.97 kg/m  Wt Readings from Last 3 Encounters:  06/24/20 158 lb 8 oz (71.9 kg)  04/15/20 153 lb (69.4 kg)  11/18/19 160 lb (72.6 kg)    Health Maintenance Due  Topic Date Due   Pneumococcal Vaccine 41-2 Years old (1 - PCV) Never done    There are no preventive care reminders to display for this patient.   Lab Results  Component Value Date   TSH 1.53 03/21/2016   Lab Results  Component Value Date   WBC 5.9 10/21/2019   HGB 15.2 10/21/2019   HCT 45.6 10/21/2019   MCV 86.4 10/21/2019   PLT 230 10/21/2019   Lab Results  Component Value Date   NA 141 10/21/2019   K 4.5 10/21/2019   CO2 31 10/21/2019   GLUCOSE 88 10/21/2019   BUN 23 10/21/2019   CREATININE 0.82 10/21/2019   BILITOT 0.4 10/21/2019   ALKPHOS 102 08/17/2017   AST 19 10/21/2019   ALT 25 10/21/2019   PROT 6.7 10/21/2019   ALBUMIN 4.6 08/17/2017   CALCIUM 9.6 10/21/2019   GFR 95.41 08/17/2017   Lab Results  Component Value Date   CHOL 154 10/21/2019   Lab Results  Component Value Date   HDL 56 10/21/2019   Lab Results  Component Value Date   LDLCALC 84 10/21/2019   Lab Results  Component Value Date   TRIG 67 10/21/2019   Lab Results  Component Value Date   CHOLHDL 2.8 10/21/2019   No results found for: HGBA1C     Assessment & Plan:   Problem List Items Addressed This Visit   None Visit Diagnoses     Benign essential tremor    -  Primary   Relevant Orders   Ambulatory referral to Neurology       1. Benign essential tremor Reassured patient that I think  this is a benign tremor. Will refer to his neurologist for second opinion, as he has appointment setup already. Will wait on labs at this time.   Time spent with patient discussing history and performing evaluation: 25 minutes   Hedwig Mcfall M Darryle Dennie, PA-C

## 2020-07-02 DIAGNOSIS — G4733 Obstructive sleep apnea (adult) (pediatric): Secondary | ICD-10-CM | POA: Diagnosis not present

## 2020-07-13 DIAGNOSIS — L409 Psoriasis, unspecified: Secondary | ICD-10-CM | POA: Diagnosis not present

## 2020-07-13 DIAGNOSIS — M2550 Pain in unspecified joint: Secondary | ICD-10-CM | POA: Diagnosis not present

## 2020-07-13 DIAGNOSIS — M545 Low back pain, unspecified: Secondary | ICD-10-CM | POA: Diagnosis not present

## 2020-07-27 ENCOUNTER — Ambulatory Visit: Payer: BC Managed Care – PPO | Admitting: Neurology

## 2020-08-02 DIAGNOSIS — G4733 Obstructive sleep apnea (adult) (pediatric): Secondary | ICD-10-CM | POA: Diagnosis not present

## 2020-08-03 ENCOUNTER — Encounter: Payer: Self-pay | Admitting: Neurology

## 2020-08-03 ENCOUNTER — Other Ambulatory Visit: Payer: Self-pay

## 2020-08-03 ENCOUNTER — Ambulatory Visit: Payer: BC Managed Care – PPO | Admitting: Neurology

## 2020-08-03 VITALS — BP 120/72 | HR 64 | Ht 65.5 in | Wt 162.0 lb

## 2020-08-03 DIAGNOSIS — R1115 Cyclical vomiting syndrome unrelated to migraine: Secondary | ICD-10-CM

## 2020-08-03 DIAGNOSIS — G4731 Primary central sleep apnea: Secondary | ICD-10-CM | POA: Diagnosis not present

## 2020-08-03 DIAGNOSIS — G250 Essential tremor: Secondary | ICD-10-CM

## 2020-08-03 NOTE — Patient Instructions (Signed)
Tremor A tremor is trembling or shaking that you cannot control. Most tremors affect the hands or arms. Tremors can also affect the head, vocal cords, face, and other parts of the body. There are many types of tremors. Common types include: Essential tremor. These usually occur in people older than 40. It may run in families and can happen in otherwise healthy people. Resting tremor. These occur when the muscles are at rest, such as when your hands are resting in your lap. People with Parkinson's disease often have resting tremors. Postural tremor. These occur when you try to hold a pose, such as keeping your hands outstretched. Kinetic tremor. These occur during purposeful movement, such as trying to touch a finger to your nose. Task-specific tremor. These may occur when you perform certain tasks such as writing, speaking, or standing. Psychogenic tremor. These dramatically lessen or disappear when you are distracted. They can happen in people of all ages. Some types of tremors have no known cause. Tremors can also be a symptom of nervous system problems (neurological disorders) that may occur with aging. Some tremors go away with treatment, while othersdo not. Follow these instructions at home: Lifestyle     Limit alcohol intake to no more than 1 drink a day for nonpregnant women and 2 drinks a day for men. One drink equals 12 oz of beer, 5 oz of wine, or 1 oz of hard liquor. Do not use any products that contain nicotine or tobacco, such as cigarettes and e-cigarettes. If you need help quitting, ask your health care provider. Avoid extreme heat and extreme cold. Limit your caffeine intake, as told by your health care provider. Try to get 8 hours of sleep each night. Find ways to manage your stress, such as meditation or yoga. General instructions Take over-the-counter and prescription medicines only as told by your health care provider. Keep all follow-up visits as told by your health  care provider. This is important. Contact a health care provider if you: Develop a tremor after starting a new medicine. Have a tremor along with other symptoms such as: Numbness. Tingling. Pain. Weakness. Notice that your tremor gets worse. Notice that your tremor interferes with your day-to-day life. Summary A tremor is trembling or shaking that you cannot control. Most tremors affect the hands or arms. Some types of tremors have no known cause. Others may be a symptom of nervous system problems (neurological disorders). Make sure you discuss any tremors you have with your health care provider. This information is not intended to replace advice given to you by your health care provider. Make sure you discuss any questions you have with your healthcare provider. Document Revised: 09/12/2019 Document Reviewed: 09/12/2019 Elsevier Patient Education  2022 [place]. I will attach the tremogram to today's visit.  I discussed with the patient the 3 main options to treat tremor if treatment is necessary:   #1 Mysoline-primidone which also has a sleep fostering aspect as it is highly sedating.  This usually is a medication I prescribed for evening or as needed nighttime use and the patient may advance to twice a day once tremor outpaces the medication dose.   #2 beta-blockers his current heart rate seems to be in normal range his blood pressure was 120/72 mmHg.  I am concerned that it may induce to low blood pressure or heart rate and could give him some morning lightheadedness or postural orthostasis.  However a beta-blocker also has the capacity to provoke asthma and prevent  migraines.  This is option be.   #3 topiramate here again this time as a seizure medication that is also effective for tremor manifestation.  Has also a migraine prevention effect.  Cannot be given to patients that have a history of kidney stones.

## 2020-08-03 NOTE — Progress Notes (Signed)
SLEEP MEDICINE CLINIC    Provider:  Larey Seat, MD  Primary Care Physician:  Marin Olp, [place] [facility] [place] [address]     Referring Provider: Marin Olp, [place],  [place] [address]          Chief Complaint according to patient   Patient presents with:     New Patient (Initial Visit)     New Problem, TREMOR , to be evaluated.          Interval history :  Alejandro [name] is a 60 year- old Caucasian male patient and her for a RV on 08-03-2020. He reports using his new CPAP every night and he falls asleep now but removes the CPAP. He falls asleep on his back, and he loses the seal when moving to the right.   Alejandro [name] has made an excellent effort he has used CPAP 28 out of 33 days, he is using a lunar machine.  The maximum usage was 7 hours 27 minutes but there are multiple days where he did not quite make it to the 4-hour mark so 13 out of 33 days.  The mean pressure is 7.4 cmH2O the average P90 5 is 9.2 cmH2O and his average AHI is 3.7 his average CAI is 1.9 based on his baseline study this is a good resolution of apnea and has not let to an arousal of central apneas.  His Epworth sleepiness score is endorsed at 7 out of 24 points today.  The patient described to me that he has noticed a tremor much restless writing or purposeful movements but sometimes while holding a sandwich or holding the remote control so there is a slight twitching between his thumb and index finger the abductor pollicis brevis seems to fasciculate.  He has noticed this with action but not at rest.  Has no resting tremor, normal finger to nose, and no ataxia. I have not been able to see fascicuations here and normal DTRs.  He has sometimes motion sickness with rapid turns, no headaches.    04/15/2020. His PSG still showed the presence of sleep apnea, and he has still insomnia, in spite of taking Zoloft.  Alejandro [name]  has a past medical  history of Allergy, Central sleep apnea, Colon cancer ([facility]), Complex sleep apnea syndrome, Complication of anesthesia, GERD (gastroesophageal reflux disease), HEMORRHOIDS-INTERNAL (10/01/2007), High cholesterol, PONV (postoperative nausea and vomiting), Psoriasis, and Right elbow pain (08/19/2015).   Alejandro [name] tested on 12-18-19 and he had a total sleep time of 260 minutes at night, delayed sleep latency was 14 minutes, he was able to gather 20% of REM sleep and stage N3 which is slow-wave sleep sleep was 12.3% of the recorded night.  His AHI was 39.4 and actually not accentuated in rem.  There was a great difference between supine sleep and nonsupine sleep supine AHI was 64.3 nonsupine 22.7 he had 51 minutes spent below 89% oxygen saturation nadir was 72%.  This is a complex sleep apnea mostly hypopnea of mostly obstructive in origin.  Be therefore suggested CPAP use again that the patient has been working with his new machine which is a lunar device I looked at the last 30 days he has on average use the machine 3 hours 3 minutes maximum usage was 4 hours 32 minutes however he has only 6.7% of the time being able to be used more than 4 hours so he is currently in  a phase of desensitization he is getting used to the machine this is his first month with the machine, due to supply chain problems he was not able to get the machine and time earlier.  The mean pressure on 95th percentile pressure 10.8 cmH2O and the daily average air leak was only 40 minutes. We have less than 30 days of data now-   So his average residual central AI was 2.8 and he has about 3 obstructive events per hour.    What I would like to do is to change him to a mask that he described as likely more comfortable for him he has tried a full facemask with either an F 30 I or a DreamWear attachment and we switched today to just a nasal pillow or nasal cradle F 30 I. He was fitted here, in office.  He likes that the tube was on top and it does not  restrict the ability to sleep on the side or even prone.  So I think he has made great effort and not to use the machine for a longer time and I am optimistic that he will come to 4 hours plus each night. With the new mask he may actually be able to have a shorter sleep latency as well the insomnia is not primarily related to his apnea it is a separate issue.  He does not feel rested or restored and often feels that he has slept less than he may on record have slept.  Further interval history- blurred vision, He has had an evaluation for vertigo, nausea, dizziness when bending forward, was seen by Dr. Benjamine Mola and [facility] , was told he doesn't have vertigo.  Vistibular migraine? This was raised by PT in vestibular rehab. When bending down he gets hot and clammy and nauseated, hands and feet feel cold and sweaty.    He stopped taking Omeprazole and started a supplement, probiotics, antihistamines and feels better- but still has a long time to fall asleep- this is primary insomnia , not related to apnea.    Was seen here in a consultation on 12-18-2019:   requested by Dr. Ansel Bong.  Chief concern according to patient :  'my mother struggled with interstittial lung disease, finbrosis and died with shortness of breath- a sickness than progressed over 1 final year". I has similar reaction to a close friends death by suicide in 2002/04/16, used xanax for 3 month".   The patient had the first sleep study in the year 04/15/2012 with a result of mixed, somewhat complex apnea .   Sleep relevant medical history: history of untreated sleep apnea. Nightmares, insomnia. Used to dream he was falling, woke up screaming. Family medical /sleep history: No other family member on CPAP with OSA, insomnia, no sleep walkers.    Social history:  Patient is working as Biochemist, clinical- low stress- no travel now,  and lives in a household with spouse, has 2 grown daughters. Younger one is aloof.  The patient currently works in daytime.Pets are  present. Tobacco use- never. ETOH use : rare ,  Caffeine intake in form of Coffee( /) Soda( very little ) Tea ( not daily) or energy drinks. Regular exercise; tennis, golf.  Hobbies : walking, hiking.    Sleep habits are as follows: The patient's dinner time is between 7 PM. The patient goes to bed at 11.30 PM and struggles to sleep-once asleep, he continues to sleep for 5-6 hours, but wakes for 1 bathroom break.  Bowel  movements  at 4-5 AM.  The preferred sleep position is either laterally or supine  , with the support of one pillow. Flat bed. GERD treated with Omeprazole.  Dreams are reportedly frequent/vivid. 5.30-6 AM is the usual rise time. The patient wakes up spontaneously.  He  reports not feeling refreshed or restored in AM, with symptoms such as residual fatigue.  Naps are not taken : l can't nap even if I wanted to"   Review of Systems: Out of a complete 14 system review, the patient complains of only the following symptoms, and all other reviewed systems are negative.:     Fatigue, sleepiness  fragmented sleep, Insomnia - trouble to go to sleep, sleep perception may be affected.   Possible hypoglycemia, Hydration ?   Vestibular sensitivity. " I can't look out the side of a window, I throw up" Cyclic vomiting.   No headaches.   Tremor - essential and he is right handed.       How likely are you to doze in the following situations: 0 = not likely, 1 = slight chance, 2 = moderate chance, 3 = high chance   Sitting and Reading? Watching Television? Sitting inactive in a public place (theater or meeting)? As a passenger in a car for an hour without a break? Lying down in the afternoon when circumstances permit? Sitting and talking to someone? Sitting quietly after lunch without alcohol? In a car, while stopped for a few minutes in traffic?   Total = 7 now from pre CPAP- 15/ 24 points   FSS endorsed at 35/ 63 points.   Social History   Socioeconomic History    Marital status: Married    Spouse name: Not on file   Number of children: 2   Years of education: BS   Highest education [name]: Not on file  Occupational History    Comment: [employer]  Tobacco Use   Smoking status: Never   Smokeless tobacco: Never  Vaping Use   Vaping Use: Never used  Substance and Sexual Activity   Alcohol use: Yes    Alcohol/week: 3.0 - 4.0 standard drinks    Types: 3 - 4 Cans of beer per week    Comment: 3 - 4 beers during some weeks    Drug use: No   Sexual activity: Yes  Other Topics Concern   Not on file  Social History Narrative   Married. 2 grown children- 18 (cosemetology school) and 22 ([employer] grad 2018) in 2018      BA at [employer] major. 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)      HObbies: sports activites- golf, tennis, Horticulturist, commercial    Social Determinants of Health   Financial Resource Strain: Not on file  Food Insecurity: Not on file  Transportation Needs: Not on file  Physical Activity: Not on file  Stress: Not on file  Social Connections: Not on file    Family History  Problem Relation Age of Onset   Sleep disorder Father    Hyperlipidemia Father    Sleep disorder Paternal Grandfather    Heart disease Paternal Grandfather    Cancer Paternal Grandmother        breast   Breast cancer Mother    Alcohol abuse Mother    Eating disorder Mother    Diabetes Maternal Grandmother    Heart disease Maternal Grandfather        late life   Hyperlipidemia  Maternal Grandfather    Stroke Maternal Grandfather        late life    Past Medical History:  Diagnosis Date   Allergy    Central sleep apnea    AHI of 44 exacerbated to 52 and failed CPAP   Colon cancer (HCC)    polpy removed from colon that was cancerous   Complex sleep apnea syndrome    baseline AHI of 44 on 04-21-11, changed to adapt SV     Complication of anesthesia    Patient has hard time waking up    GERD (gastroesophageal reflux disease)    HEMORRHOIDS-INTERNAL 10/01/2007   Qualifier: Diagnosis of  By: Fuller Plan MD Marijo Conception    High cholesterol    PONV (postoperative nausea and vomiting)    Psoriasis    Right elbow pain 08/19/2015    Past Surgical History:  Procedure Laterality Date   COLONOSCOPY  2009   CANCEROUS POLYPS REMOVED   KNEE ARTHROSCOPY Left 1979   KNEE ARTHROSCOPY Right 08/22/2012   Procedure: RIGHT KNEE ARTHROSCOPY WITH DEBRIDEMENT ;  Surgeon: Marin Shutter, MD;  Location: [facility];  Service: Orthopedics;  Laterality: Right;   WRIST FRACTURE SURGERY Left ~ 1995     Current Outpatient Medications on File Prior to Visit  Medication Sig Dispense Refill   ALPRAZolam (XANAX) 0.25 MG tablet Take 1 tablet (0.25 mg total) by mouth daily as needed for anxiety (only to be used as needed for vestibular/ cyclic vomiting.). Prn for vestibular/ cyclic vomiting. 30 tablet 1   atorvastatin (LIPITOR) 40 MG tablet Take 1 tablet by mouth once daily 90 tablet 0   Calcipotriene 0.005 % solution APPLY  SOLUTION TOPICALLY TWICE DAILY 60 mL 0   clotrimazole-betamethasone (LOTRISONE) cream APPLY 1 APPLICATION TOPICALLY 2 TIMES DAILY 30 g 0   meclizine (ANTIVERT) 12.5 MG tablet Take 1 tablet (12.5 mg total) by mouth 3 (three) times daily as needed for dizziness (or motion sickness). Do not take  with scopolamine (Patient taking differently: Take 12.5 mg by mouth 3 (three) times daily as needed for dizziness (or motion sickness). Do not take  with scopolamine) 30 tablet 1   ondansetron (ZOFRAN) 4 MG tablet Take 4 mg by mouth every 8 (eight) hours as needed for nausea or vomiting.     scopolamine (TRANSDERM-SCOP) 1 MG/3DAYS Place 1 patch (1.5 mg total) onto the skin every 3 (three) days as needed (For motion sickness). 10 patch 0   sertraline (ZOLOFT) 50 MG tablet Take 1 tablet (50 mg total) by mouth at bedtime. 30 tablet 5   No current facility-administered medications on file prior to visit.     Allergies  Allergen Reactions   Cortisone    Depo-Medrol [Methylprednisolone Sodium Succ] Other (See Comments)    Blurry vision- blisters behind retinas   Other Other (See Comments)    Does/t likt narcotics and cannot take steroids  Blurry vision- blisters behind retinas    Codeine Other (See Comments)    REACTION: nausea, vomiting ABD PAIN REACTION: nausea, vomiting    Physical exam:  Today's Vitals   08/03/20 1111  BP: 120/72  Pulse: 64  Weight: 162 lb (73.5 kg)  Height: 5' 5.5" (1.664 m)   Body mass index is 26.55 kg/m.   Wt Readings from Last 3 Encounters:  08/03/20 162 lb (73.5 kg)  06/24/20 158 lb 8 oz (71.9 kg)  04/15/20 153 lb (69.4 kg)     Ht Readings from Last 3 Encounters:  08/03/20 5' 5.5" (1.664 m)  06/24/20 5' 5.5" (1.664 m)  04/15/20 5' 5.5" (1.664 m)      General: The patient is awake, alert and appears not in acute distress. The patient is well groomed. Head: Normocephalic, atraumatic.  Neck is supple. Mallampati 2,  neck circumference:16 inches .  Nasal airflow congested- deviated septum -  Retrognathia is mild .  Dental status: intact  Cardiovascular:  Regular rate and cardiac rhythm by pulse,  without distended neck veins. Respiratory: Lungs are clear to auscultation.  Skin:  Without evidence of ankle edema, or rash. Trunk: The patient's posture is erect.   Neurologic exam : The patient is awake and alert, oriented to place and time.   Memory subjective described as intact.  Attention span & concentration ability appears normal.  Speech is fluent,  without  dysarthria, dysphonia or aphasia.  Mood and affect are appropriate.   Cranial nerves: no loss of smell or taste reported  Pupils are equal and briskly reactive to light. Funduscopic exam deferred.  Extraocular movements in vertical and horizontal planes were intact and with endpoint nystagmus only on the left . No Diplopia. Visual fields by finger perimetry are intact. Hearing  was intact to soft voice and finger rubbing.    Facial sensation intact to fine touch.  Facial motor strength is symmetric and tongue and uvula move midline.  Neck ROM : rotation, tilt and flexion extension were normal for age and shoulder shrug was symmetrical.  He reports frequent neck pain, forcing him to sleep more supine.    Motor exam:  Symmetric bulk, tone and ROM.   Normal tone without cog- wheeling, symmetric grip strength .   Sensory: Vibration sense remains normal.  Proprioception tested in the upper extremities was normal.   Coordination: Rapid alternating movements in the fingers/hands were of normal speed.  The Finger-to-nose maneuver was intact without evidence of ataxia, dysmetria or tremor. He has no cogwheeling and no visible tremor.    Gait and station: Patient could rise unassisted from a seated position, walked without assistive device.  Stance is of normal width/ base and the patient turned with 3 steps.  Toe and heel walk were deferred.  Deep tendon reflexes: in the  upper and lower extremities are symmetric and intact.  Babinski response was deferred.      After spending a total time of 45 minutes face to face and additional time for physical and neurologic examination, review of laboratory studies,  personal review of imaging studies, reports and results of other testing and review of referral information / records as far as provided in visit, I have established the following assessments:  Insomnia and apnea, working up to 4 hours or more with a new CPAP interface.  Tremor when holding on to a sandwich the remote, but not affecting ability to type , to function , no reported impairment-  Tremogram benign.       My Plan is to proceed with:  1) Zoloft for 50 mg.  To continue.  2) CPAP has been working in reduction of apnea. He changed to a nasal pillow ResMed P30! M. He uses it almost every day but still looses the mask at night- reduced complance .  3)  try Xanax [id number] mg for cyclic vomiting- he has not needed it.  4) Tremor - essential, not enough impairment  to warrant any intervention.   I will attach the tremor gram to today's visit.  I discussed with the patient the  3 main options to treat tremor if treatment is necessary:   #1 Mysoline-primidone which also has a sleep fostering aspect as it is highly sedating.  This usually is a medication I prescribed for evening or as needed nighttime use and the patient may advance to twice a day once tremor outpaces the medication dose.   #2 beta-blockers his current heart rate seems to be in normal range his blood pressure was 120/72 mmHg.  I am concerned that it may induce to low blood pressure or heart rate and could give him some morning lightheadedness or postural orthostasis.  However a beta-blocker also has the capacity to provoke asthma and prevent migraines.  This is option be.   #3 topiramate here again this time as a seizure medication that is also effective for tremor manifestation.  Has also a migraine prevention effect.  Cannot be given to patients that have a history of kidney stones.     I would like to thank Marin Olp, Md [address] [place],  [place] [address] for allowing me to meet with and to take care of this pleasant patient.   In short, Estiben Masaharu Wolbert is presenting with sleep initiation insomnia and untreated apnea.  I plan to follow up either personally or through our NP within 3 month.   CC: I will share my notes with PCP.  Electronically signed by: Larey Seat, MD 08/03/2020 11:27 AM  [facility] Board certified by The [employer] of Sleep Medicine and Diplomate of the [employer] of Sleep Medicine. Board certified In Neurology through the [employer], Fellow of the [employer] of Neurology. Medical Director of [facility].

## 2020-09-02 DIAGNOSIS — G4733 Obstructive sleep apnea (adult) (pediatric): Secondary | ICD-10-CM | POA: Diagnosis not present

## 2020-10-02 DIAGNOSIS — G4733 Obstructive sleep apnea (adult) (pediatric): Secondary | ICD-10-CM | POA: Diagnosis not present

## 2020-11-02 DIAGNOSIS — G4733 Obstructive sleep apnea (adult) (pediatric): Secondary | ICD-10-CM | POA: Diagnosis not present

## 2020-11-10 ENCOUNTER — Other Ambulatory Visit: Payer: Self-pay | Admitting: Neurology

## 2020-12-07 ENCOUNTER — Other Ambulatory Visit: Payer: Self-pay

## 2020-12-07 ENCOUNTER — Ambulatory Visit (INDEPENDENT_AMBULATORY_CARE_PROVIDER_SITE_OTHER): Payer: BC Managed Care – PPO | Admitting: Family Medicine

## 2020-12-07 ENCOUNTER — Encounter: Payer: Self-pay | Admitting: Family Medicine

## 2020-12-07 VITALS — BP 124/74 | HR 62 | Temp 98.2°F | Ht 65.0 in | Wt 164.0 lb

## 2020-12-07 DIAGNOSIS — Z79899 Other long term (current) drug therapy: Secondary | ICD-10-CM

## 2020-12-07 DIAGNOSIS — Z0000 Encounter for general adult medical examination without abnormal findings: Secondary | ICD-10-CM

## 2020-12-07 DIAGNOSIS — E785 Hyperlipidemia, unspecified: Secondary | ICD-10-CM

## 2020-12-07 DIAGNOSIS — K219 Gastro-esophageal reflux disease without esophagitis: Secondary | ICD-10-CM | POA: Diagnosis not present

## 2020-12-07 DIAGNOSIS — Z125 Encounter for screening for malignant neoplasm of prostate: Secondary | ICD-10-CM

## 2020-12-07 LAB — CBC WITH DIFFERENTIAL/PLATELET
Basophils Absolute: 0 10*3/uL (ref 0.0–0.1)
Basophils Relative: 0.6 % (ref 0.0–3.0)
Eosinophils Absolute: 0.2 10*3/uL (ref 0.0–0.7)
Eosinophils Relative: 3 % (ref 0.0–5.0)
HCT: 46.8 % (ref 39.0–52.0)
Hemoglobin: 15.3 g/dL (ref 13.0–17.0)
Lymphocytes Relative: 14.5 % (ref 12.0–46.0)
Lymphs Abs: 0.8 10*3/uL (ref 0.7–4.0)
MCHC: 32.8 g/dL (ref 30.0–36.0)
MCV: 87 fl (ref 78.0–100.0)
Monocytes Absolute: 0.4 10*3/uL (ref 0.1–1.0)
Monocytes Relative: 7.5 % (ref 3.0–12.0)
Neutro Abs: 4.2 10*3/uL (ref 1.4–7.7)
Neutrophils Relative %: 74.4 % (ref 43.0–77.0)
Platelets: 245 10*3/uL (ref 150.0–400.0)
RBC: 5.37 Mil/uL (ref 4.22–5.81)
RDW: 14.3 % (ref 11.5–15.5)
WBC: 5.7 10*3/uL (ref 4.0–10.5)

## 2020-12-07 LAB — COMPREHENSIVE METABOLIC PANEL
ALT: 28 U/L (ref 0–53)
AST: 23 U/L (ref 0–37)
Albumin: 4.6 g/dL (ref 3.5–5.2)
Alkaline Phosphatase: 123 U/L — ABNORMAL HIGH (ref 39–117)
BUN: 20 mg/dL (ref 6–23)
CO2: 30 mEq/L (ref 19–32)
Calcium: 9.6 mg/dL (ref 8.4–10.5)
Chloride: 101 mEq/L (ref 96–112)
Creatinine, Ser: 0.83 mg/dL (ref 0.40–1.50)
GFR: 96.32 mL/min (ref >60.00)
Glucose, Bld: 89 mg/dL (ref 70–99)
Potassium: 4.3 mEq/L (ref 3.5–5.1)
Sodium: 139 mEq/L (ref 135–145)
Total Bilirubin: 0.5 mg/dL (ref 0.2–1.2)
Total Protein: 7 g/dL (ref 6.0–8.3)

## 2020-12-07 LAB — LIPID PANEL
Cholesterol: 303 mg/dL — ABNORMAL HIGH (ref 0–200)
HDL: 62.3 mg/dL (ref >39.00)
LDL Cholesterol: 216 mg/dL — ABNORMAL HIGH (ref 0–99)
NonHDL: 241.04
Total CHOL/HDL Ratio: 5
Triglycerides: 123 mg/dL (ref 0.0–149.0)
VLDL: 24.6 mg/dL (ref 0.0–40.0)

## 2020-12-07 LAB — VITAMIN B12: Vitamin B-12: 524 pg/mL (ref 211–911)

## 2020-12-07 LAB — PSA: PSA: 1.33 ng/mL (ref 0.10–4.00)

## 2020-12-07 NOTE — Patient Instructions (Addendum)
Please stop by lab before you go If you have mychart- we will send your results within 3 business days of [place] receiving them.  If you do not have mychart- we will call you about results within 5 business days of [place] receiving them.  *please also note that you will see labs on mychart as soon as they post. I will later go in and write notes on them- will say "notes from Dr. Yong Channel"   Glad you are doing so well! I love your idea on working on gradual weight loss.   Recommended follow up: Return in about 1 year (around 12/07/2021) for physical or sooner if needed.

## 2020-12-07 NOTE — Progress Notes (Signed)
Phone: [phone]   Subjective:  Patient presents today for their annual physical. Chief complaint-noted.   See problem oriented charting- ROS- full  review of systems was completed and negative  except for: urinary frequency, motion sickness  The following were reviewed and entered/updated in epic: Past Medical History:  Diagnosis Date   Allergy    Central sleep apnea    AHI of 44 exacerbated to 52 and failed CPAP   Colon cancer (HCC)    polpy removed from colon that was cancerous   Complex sleep apnea syndrome    baseline AHI of 44 on 04-21-11, changed to adapt SV     Complication of anesthesia    Patient has hard time waking up   GERD (gastroesophageal reflux disease)    HEMORRHOIDS-INTERNAL 10/01/2007   Qualifier: Diagnosis of  By: Fuller Plan MD Lamont Snowball T    High cholesterol    PONV (postoperative nausea and vomiting)    Psoriasis    Right elbow pain 08/19/2015   Patient Active Problem List   Diagnosis Date Noted   Psoriasis 02/22/2016    Priority: High   Severe motion sickness 02/22/2016    Priority: High   History of colon cancer 10/01/2007    Priority: High   Benign essential tremor 08/03/2020    Priority: Medium    Cyclical vomiting syndrome not associated with migraine 04/15/2020    Priority: Medium    Insomnia disorder, with other sleep disorder, recurrent 04/15/2020    Priority: Medium    Complex sleep apnea syndrome 12/19/2019    Priority: Medium    Syncope 06/14/2017    Priority: Medium    Groin rash 02/22/2016    Priority: Medium    Hyperhidrosis of palms and soles 02/22/2016    Priority: Medium    Hyperlipidemia 09/05/2013    Priority: Medium    GERD (gastroesophageal reflux disease) 09/05/2013    Priority: Medium    Sleep apnea 09/28/2011    Priority: Medium    Severe obstructive sleep apnea-hypopnea syndrome 04/15/2020    Priority: Low   Intolerance of continuous positive airway pressure (CPAP) ventilation 12/19/2019    Priority: Low    DDD (degenerative disc disease), lumbar 10/21/2019    Priority: Low   Prolonged P-R interval 07/25/2017    Priority: Low   Left hamstring muscle strain 12/08/2014    Priority: Low   Vestibular disequilibrium 04/15/2020   Past Surgical History:  Procedure Laterality Date   COLONOSCOPY  2009   CANCEROUS POLYPS REMOVED   KNEE ARTHROSCOPY Left 1979   KNEE ARTHROSCOPY Right 08/22/2012   Procedure: RIGHT KNEE ARTHROSCOPY WITH DEBRIDEMENT ;  Surgeon: Marin Shutter, MD;  Location: [facility];  Service: Orthopedics;  Laterality: Right;   WRIST FRACTURE SURGERY Left ~ 1995    Family History  Problem Relation Age of Onset   Sleep disorder Father    Hyperlipidemia Father    Sleep disorder Paternal Grandfather    Heart disease Paternal Grandfather    Cancer Paternal Grandmother        breast   Breast cancer Mother    Alcohol abuse Mother    Eating disorder Mother    Diabetes Maternal Grandmother    Heart disease Maternal Grandfather        late life   Hyperlipidemia Maternal Grandfather    Stroke Maternal Grandfather        late life    Medications- reviewed and updated Current Outpatient Medications  Medication Sig Dispense Refill  ALPRAZolam (XANAX) 0.25 MG tablet Take 1 tablet (0.25 mg total) by mouth daily as needed for anxiety (only to be used as needed for vestibular/ cyclic vomiting.). Prn for vestibular/ cyclic vomiting. 30 tablet 1   Calcipotriene 0.005 % solution APPLY  SOLUTION TOPICALLY TWICE DAILY 60 mL 0   clotrimazole-betamethasone (LOTRISONE) cream APPLY 1 APPLICATION TOPICALLY 2 TIMES DAILY 30 g 0   ondansetron (ZOFRAN) 4 MG tablet Take 4 mg by mouth every 8 (eight) hours as needed for nausea or vomiting.     scopolamine (TRANSDERM-SCOP) 1 MG/3DAYS Place 1 patch (1.5 mg total) onto the skin every 3 (three) days as needed (For motion sickness). 10 patch 0   sertraline (ZOLOFT) 50 MG tablet TAKE 1 TABLET BY MOUTH AT BEDTIME 30 tablet 10   No current facility-administered  medications for this visit.    Allergies-reviewed and updated Allergies  Allergen Reactions   Cortisone    Depo-Medrol [Methylprednisolone Sodium Succ] Other (See Comments)    Blurry vision- blisters behind retinas   Other Other (See Comments)    Does/t likt narcotics and cannot take steroids  Blurry vision- blisters behind retinas    Codeine Other (See Comments)    REACTION: nausea, vomiting ABD PAIN REACTION: nausea, vomiting    Social History   Social History Narrative   Married. 2 grown daughters-  one in [place] ([employer] grad) and one in [place] (23) in 2022.       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 Oropharynx normal Neck: no thyromegaly CV: RRR no murmurs rubs or gallops Lungs: CTAB no crackles, wheeze, rhonchi Abdomen: soft/nontender/nondistended/normal bowel sounds. No rebound or guarding.  Ext: no edema Skin: warm, dry Neuro: grossly normal, moves all extremities, PERRLA    Assessment and Plan  60 y.o. male presenting for annual physical.  Health Maintenance counseling: 1. Anticipatory guidance: Patient counseled regarding regular dental exams -q6 months, eye exams -yearly,  avoiding smoking and second hand smoke, limiting alcohol to 2 beverages per day, no illicit drug use 2. Risk factor reduction:  Advised patient of need for regular exercise and diet rich and fruits and vegetables to reduce risk of heart attack and stroke. Exercise- tennis once a week and golf once a week  - active at home- also doing some walking/hiking/frisbee golf. Diet- discussed improving diet in last physical - weight today is up by 3  lbs today since last physical here- wants to lose some weight- trying ice water in the morning- also working on portion size and sugar intake (other than natural sugars like fruit)  Wt Readings from Last 3 Encounters:  12/07/20 164 lb (74.4 kg)  08/03/20 162 lb (73.5 kg)  06/24/20 158 lb 8 oz (71.9 kg)  3. Immunizations/screenings/ancillary studies - otherwise up-to-date. Immunization History  Administered Date(s) Administered   Influenza, Quadrivalent, Recombinant, Inj, Pf 09/05/2017, 10/05/2018   Influenza,inj,Quad PF,6+ Mos 11/16/2020   Influenza-Unspecified 09/21/2015, 10/25/2016, 09/05/2017, 10/05/2018, 09/24/2019   PFIZER(Purple Top)SARS-COV-2 Vaccination 03/20/2019, 04/10/2019, 04/01/2020   Pfizer Covid-19 Vaccine Bivalent Booster 45yrs & up 11/08/2020  Tdap 06/03/2006, 01/01/2017   Zoster Recombinat (Shingrix) 02/18/2019, 06/03/2019   4. Prostate cancer screening-  low risk PSA trend. Last year patient reported having some urinary frequency and urgency (really bothers him) as well as weaker stream and dribbling afterwards. Discussed finasteride and tamsulosin- he opted out at that time- did discuss double voiding trial. Symptoms were largely stable. Today overall stable. Luckily psa trend low risk Lab Results  Component Value Date   PSA 0.73 10/21/2019   PSA 0.98 08/17/2017   PSA 0.67 03/21/2016   5. Colon cancer history - last colonoscopy done 08/06/2018 with a 3-year repeat recommended. Does get baseline loose stools. No more blood in stool since last year- hx of anal fissure though and he thinks off and on bothers him. Overall better 6. Skin cancer screening- followed with Dr. Allyson Sabal in the past but last year # hard spot on back of right leg-  Possible cyst - referred to [facility] dermatology Dr. Martin Majestic and reports good eval . Advised regular sunscreen use. Denies worrisome, changing, or new skin lesions.  7. Smoking associated screening (lung cancer screening, AAA screen 65-75, UA)-  never smoker 8. STD screening - opts out as monogamous  Status of chronic or acute concerns   #OSA unable to tolerate CPAP previously now doing okay with nasal pillow #insomnia better with cpap- finally falling asleep -has seen Dr. Brett Fairy who recommended seeing ENT to discuss inspire device/implant-they recommended another trial of CPAP to document failure. he had follow-up with Dr. Brett Fairy and seemed to be doing better with a nasal pillow-was also given Xanax for cyclical vomiting but he did not need as of August visit.  He was also later set up in August with Zoloft to see if this would help with sleep. Overall sleeping better but taking CPAP off -Has not been interested in CBT-I in the past- would say doesn't need now  #Essential tremor-noted by neurology-not significant enough to warrant intervention.   #Severe motion sickness (hypersensitive vestibular system)- last year patient continued to have severe issues with this. Has had issues since childhood. Can cause nausea and vomiting. May 2021 visit patient was having more frequent bouts of symptoms. Eye doctor in the past did not think he needed new prescription-he was having issues even if he did controlled movements. Was doing better on promethazine last visit. He preferred a long-term preventative. Thinks it contributes to his GI symptoms. Feels like symptoms are worsening over time.  -We tried scopolamine patch- no help -Referred to vestibular therapy- this was not helpful -Ultimately referred to ear nose and throat-he saw Dr. Amado Coe with [facility]- recommended PT and habituation exercises-they thought nausea, vomiting, burping or vagal responses to motion intolerance stimulus. Whole day of vertigo testing and was told he didn't have vertigo symptoms- which he told him he did not have.  - I did not have a great solution at that time  - slightly better lately thankfully- more luck of the draw- only 1 episode in last 4-6 months - hasnt need  alprazolam since not having episodes of vertigo which cause vomiting  #hyperlipidemia S: Medication:atorvastatin 40 mg daily in past- has been off  Lab Results  Component Value Date   CHOL 154 10/21/2019   HDL 56 10/21/2019   LDLCALC 84 10/21/2019   TRIG 67 10/21/2019   CHOLHDL 2.8 10/21/2019   A/P: since trialing off we will update lipids and recalculate 10 year ascvd risk- we discussed ct cardiac scoring- wants to see where #s lie  first - leans toward restarting if #s high  # GERD S:Medication: Prilosec 20 mg daily in the AM - reported burps and gassy if off med A/P: Controlled. Continue current medications. Did not tolerate coming off med- update b12 with labs   # Hyperglycemia/insulin resistance/prediabetes S:  Medication: none at present - blood sugar fasting was normal back in 2020-discussed coming back for fasting labs to recheck- he wanted to do labs and we can consider fasting today if levels off  A/P: check fasting cbg today  #Psoriasis-calcipotriene helps with psoriasis in his scalp. Lotrisone helps when he has issues on the penis or jock itch- or has fluticasone cream and hctz 2.5%- sometimes for itching without clear cause  # low back pain- had MRI in the past and was told had DDD by Dr. Tonita Cong. He was still able to play sports but at lower level. Area becomes inflamed more easily with regular activities. Intermittent stretching. Rolfing- deep tissue massage is helpful - not much aleve or ibuprofen lately  Recommended follow up: Return in about 1 year (around 12/07/2021) for physical or sooner if needed. Future Appointments  Date Time Provider [place]  08/04/2021 10:30 AM Dohmeier, Asencion Partridge, MD [facility] None   Lab/Order associations: fasting   ICD-10-CM   1. Preventative health care  Z00.00 CBC with Differential/Platelet    Comprehensive metabolic panel    Lipid panel    PSA    Vitamin B12    2. Hyperlipidemia, unspecified hyperlipidemia type  E78.5 CBC with  Differential/Platelet    Comprehensive metabolic panel    Lipid panel    3. Gastroesophageal reflux disease without esophagitis  K21.9     4. High risk medication use  Z79.899 Vitamin B12    5. Screening for prostate cancer  Z12.5 PSA      No orders of the defined types were placed in this encounter.  I,Harris Phan,acting as a Education administrator for Garret Reddish, MD.,have documented all relevant documentation on the behalf of Garret Reddish, MD,as directed by  Garret Reddish, MD while in the presence of Garret Reddish, MD.    I, Garret Reddish, MD, have reviewed all documentation for this visit. The documentation on 12/07/20 for the exam, diagnosis, procedures, and orders are all accurate and complete.   Return precautions advised.  Garret Reddish, MD

## 2020-12-09 ENCOUNTER — Other Ambulatory Visit: Payer: Self-pay | Admitting: *Deleted

## 2020-12-09 MED ORDER — ATORVASTATIN CALCIUM 40 MG PO TABS: 40.0000 mg | ORAL_TABLET | Freq: Every day | ORAL | 3 refills | Status: DC

## 2020-12-09 NOTE — Progress Notes (Signed)
atr

## 2020-12-22 ENCOUNTER — Other Ambulatory Visit: Payer: Self-pay | Admitting: Family Medicine

## 2021-02-16 ENCOUNTER — Encounter: Payer: Self-pay | Admitting: Family Medicine

## 2021-02-16 ENCOUNTER — Ambulatory Visit: Payer: BC Managed Care – PPO | Admitting: Family Medicine

## 2021-02-16 ENCOUNTER — Other Ambulatory Visit: Payer: Self-pay

## 2021-02-16 VITALS — BP 120/68 | HR 73 | Temp 98.5°F | Ht 65.0 in | Wt 164.1 lb

## 2021-02-16 DIAGNOSIS — J0101 Acute recurrent maxillary sinusitis: Secondary | ICD-10-CM | POA: Diagnosis not present

## 2021-02-16 MED ORDER — IPRATROPIUM BROMIDE 0.06 % NA SOLN: 2.0000 | Freq: Four times a day (QID) | NASAL | Status: DC

## 2021-02-16 MED ORDER — IPRATROPIUM BROMIDE 0.06 % NA SOLN: 2.0000 | Freq: Four times a day (QID) | NASAL | 12 refills | Status: DC

## 2021-02-16 MED ORDER — AZELASTINE HCL 0.1 % NA SOLN: 2.0000 | Freq: Two times a day (BID) | NASAL | 12 refills | Status: DC

## 2021-02-16 MED ORDER — AMOXICILLIN-POT CLAVULANATE 875-125 MG PO TABS: 1.0000 | ORAL_TABLET | Freq: Two times a day (BID) | ORAL | 0 refills | Status: DC

## 2021-02-16 NOTE — Progress Notes (Signed)
Subjective:     Patient ID: Alejandro [name], male    DOB: 12-21-1960, 61 y.o.   MRN: [id number]  Chief Complaint  Patient presents with   Cough    Productive cough Took Sudafed yesterday only    Nasal Congestion    Possible sinus infection started about 8 or 9 days ago     HPI Chief complaint: congestion, cough Symptom onset: 8-9 days Pertinent positives: congestion in head and nose.  Couging up green.  Pressure in head.  Pressure in ears.was flying as still hard to clear ears Pertinent negatives: covid test neg.  No f/c, no sob no v/d Treatments tried: sudafed yesterday Vaccine status: UTD Sick exposure: none   Chronic congestion-flonase not work. Wears c-pap.  Mouth breather  There are no preventive care reminders to display for this patient.  Past Medical History:  Diagnosis Date   Allergy    Central sleep apnea    AHI of 44 exacerbated to 52 and failed CPAP   Colon cancer (HCC)    polpy removed from colon that was cancerous   Complex sleep apnea syndrome    baseline AHI of 44 on 04-21-11, changed to adapt SV     Complication of anesthesia    Patient has hard time waking up   GERD (gastroesophageal reflux disease)    HEMORRHOIDS-INTERNAL 10/01/2007   Qualifier: Diagnosis of  By: Fuller Plan MD Marijo Conception    High cholesterol    PONV (postoperative nausea and vomiting)    Psoriasis    Right elbow pain 08/19/2015    Past Surgical History:  Procedure Laterality Date   COLONOSCOPY  2009   CANCEROUS POLYPS REMOVED   KNEE ARTHROSCOPY Left 1979   KNEE ARTHROSCOPY Right 08/22/2012   Procedure: RIGHT KNEE ARTHROSCOPY WITH DEBRIDEMENT ;  Surgeon: Marin Shutter, MD;  Location: [facility];  Service: Orthopedics;  Laterality: Right;   WRIST FRACTURE SURGERY Left ~ 1995    Outpatient Medications Prior to Visit  Medication Sig Dispense Refill   ALPRAZolam (XANAX) 0.25 MG tablet Take 1 tablet (0.25 mg total) by mouth daily as needed for anxiety (only to be used as needed  for vestibular/ cyclic vomiting.). Prn for vestibular/ cyclic vomiting. 30 tablet 1   atorvastatin (LIPITOR) 40 MG tablet Take 1 tablet (40 mg total) by mouth daily. 90 tablet 3   betamethasone dipropionate 0.05 % lotion APPLY LOTION TOPICALLY ONCE DAILY TO SCALP AND EARS     Calcipotriene 0.005 % solution APPLY  SOLUTION TOPICALLY TWICE DAILY 60 mL 0   clotrimazole-betamethasone (LOTRISONE) cream APPLY 1 APPLICATION TOPICALLY 2 TIMES DAILY 30 g 0   fluticasone (CUTIVATE) 3.76 % cream 1 application.     hydrocortisone 2.5 % ointment APPLY OINTMENT EXTERNALLY ONCE DAILY TO FACE AND GENITALS     Multiple Vitamin (MULTIVITAMIN ADULT) TABS 1 tablet     omeprazole (PRILOSEC) 20 MG capsule 1 capsule 30 minutes before morning meal     ondansetron (ZOFRAN) 4 MG tablet Take 4 mg by mouth every 8 (eight) hours as needed for nausea or vomiting.     scopolamine (TRANSDERM-SCOP) 1 MG/3DAYS Place 1 patch (1.5 mg total) onto the skin every 3 (three) days as needed (For motion sickness). 10 patch 0   sertraline (ZOLOFT) 50 MG tablet TAKE 1 TABLET BY MOUTH AT BEDTIME 30 tablet 10   No facility-administered medications prior to visit.    Allergies  Allergen Reactions   Corticosteroids     Other reaction(s):  Other (See Comments) Blurry vision- blisters behind retinas Blurry vision- blisters behind retinas   Cortisone    Depo-Medrol [Methylprednisolone Sodium Succ] Other (See Comments)    Blurry vision- blisters behind retinas   Other Other (See Comments)    Does/t likt narcotics and cannot take steroids  Blurry vision- blisters behind retinas    Codeine Other (See Comments)    REACTION: nausea, vomiting ABD PAIN REACTION: nausea, vomiting Other reaction(s): Other (See Comments), stomach cramps ABD PAIN REACTION: nausea, vomiting REACTION: nausea, vomiting ABD PAIN REACTION: nausea, vomiting    Prednisone     Other reaction(s): blisters on retina   PHX:[id number] except as noted  in HPI      Objective:     BP 120/68    Pulse 73    Temp 98.5 F (36.9 C) (Temporal)    Ht 5\' 5"  (1.651 m)    Wt 164 lb 2 oz (74.4 kg)    SpO2 95%    BMI 27.31 kg/m  Wt Readings from Last 3 Encounters:  02/16/21 164 lb 2 oz (74.4 kg)  12/07/20 164 lb (74.4 kg)  08/03/20 162 lb (73.5 kg)        Gen: WDWN NAD WM HEENT: NCAT, conjunctiva not injected, sclera nonicteric TM WNL B, OP moist, no exudates .  Sinuses tender NECK:  supple, no thyromegaly, no nodes, no carotid bruits CARDIAC: RRR, S1S2+, no murmur. DP 2+B LUNGS: CTAB. No wheezes EXT:  no edema MSK: no gross abnormalities.  NEURO: A&O x3.  CN II-XII intact.  PSYCH: normal mood. Good eye contact  Assessment & Plan:   Problem List Items Addressed This Visit   None Visit Diagnoses     Acute recurrent maxillary sinusitis    -  Primary   Relevant Medications   azelastine (ASTELIN) 0.1 % nasal spray   amoxicillin-clavulanate (AUGMENTIN) 875-125 MG tablet   ipratropium (ATROVENT) 0.06 % nasal spray      Sinusitis-augmentin Allergic rhinitis-astelin and atrovent as flonase not help  Meds ordered this encounter  Medications   azelastine (ASTELIN) 0.1 % nasal spray    Sig: Place 2 sprays into both nostrils 2 (two) times daily.    Dispense:  30 mL    Refill:  12   DISCONTD: ipratropium (ATROVENT) 0.06 % nasal spray 2 spray   amoxicillin-clavulanate (AUGMENTIN) 875-125 MG tablet    Sig: Take 1 tablet by mouth 2 (two) times daily.    Dispense:  20 tablet    Refill:  0   ipratropium (ATROVENT) 0.06 % nasal spray    Sig: Place 2 sprays into both nostrils 4 (four) times daily.    Dispense:  15 mL    Refill:  12    Wellington Hampshire, MD

## 2021-02-16 NOTE — Patient Instructions (Addendum)
Meds have been sent the the pharmacy You can take tylenol for pain/fevers If worsening symptoms, let us know or go to the Emergency room   Astelin(azelastin)-nasal-spray twice daily as needed-over the counter Ipratroprium-for nose-prescription

## 2021-02-22 ENCOUNTER — Telehealth: Payer: Self-pay | Admitting: Family Medicine

## 2021-02-22 NOTE — Telephone Encounter (Signed)
Pt states he received a bill from [facility] for his visit on 12/07/20 for $64.41. This visit was for a physical. He states he spoke with [facility] and his insurance on a three way call. Insurance is stating the appt was coded wrong and [facility] told him to call our office.

## 2021-02-24 NOTE — Telephone Encounter (Signed)
I have emailed coding and billing in regard.

## 2021-02-25 NOTE — Telephone Encounter (Signed)
Coding has been changed and resubmitted.  I have informed patient.

## 2021-04-01 ENCOUNTER — Telehealth: Payer: Self-pay | Admitting: Family Medicine

## 2021-04-01 NOTE — Telephone Encounter (Signed)
LM on cell phone voicemail: ? ?[place] and supervisor reached out to Oncologist and confirmed that a new claim was sent for labs which resulted in the lower self pay balance.  ? ?The next step is to ask the coverage [facility]) what specific codes could be used to eliminate self-pay according to plan benefits. ? ?With that information we can ask about re-filing the claim with the payor within the parameters of coding.  ?

## 2021-04-01 NOTE — Telephone Encounter (Signed)
error 

## 2021-06-27 ENCOUNTER — Telehealth: Payer: Self-pay | Admitting: Neurology

## 2021-08-02 NOTE — Telephone Encounter (Signed)
LVM for pt to bring in CPAP to appt for manual DL

## 2021-08-03 ENCOUNTER — Encounter: Payer: Self-pay | Admitting: Neurology

## 2021-08-03 ENCOUNTER — Ambulatory Visit: Payer: BC Managed Care – PPO | Admitting: Neurology

## 2021-08-03 VITALS — BP 130/74 | HR 61 | Ht 65.0 in | Wt 166.0 lb

## 2021-08-03 DIAGNOSIS — G4731 Primary central sleep apnea: Secondary | ICD-10-CM | POA: Diagnosis not present

## 2021-08-03 DIAGNOSIS — G4700 Insomnia, unspecified: Secondary | ICD-10-CM

## 2021-08-03 DIAGNOSIS — G250 Essential tremor: Secondary | ICD-10-CM | POA: Diagnosis not present

## 2021-08-03 DIAGNOSIS — G478 Other sleep disorders: Secondary | ICD-10-CM

## 2021-08-03 MED ORDER — TRAZODONE HCL 50 MG PO TABS: 25.0000 mg | ORAL_TABLET | Freq: Every day | ORAL | 0 refills | Status: DC

## 2021-08-03 NOTE — Patient Instructions (Signed)
Trazodone Tablets What is this medication? TRAZODONE (TRAZ oh done) treats depression. It increases the amount of serotonin in the brain, a hormone that helps regulate mood. This medicine may be used for other purposes; ask your health care provider or pharmacist if you have questions. COMMON BRAND NAME(S): Desyrel What should I tell my care team before I take this medication? They need to know if you have any of these conditions: Attempted suicide or thinking about it Bipolar disorder Bleeding problems Glaucoma Heart disease, or previous heart attack Irregular heart beat Kidney or liver disease Low levels of sodium in the blood An unusual or allergic reaction to trazodone, other medications, foods, dyes or preservatives Pregnant or trying to get pregnant Breast-feeding How should I use this medication? Take this medication by mouth with a glass of water. Follow the directions on the prescription label. Take this medication shortly after a meal or a light snack. Take your medication at regular intervals. Do not take your medication more often than directed. Do not stop taking this medication suddenly except upon the advice of your care team. Stopping this medication too quickly may cause serious side effects or your condition may worsen. A special MedGuide will be given to you by the pharmacist with each prescription and refill. Be sure to read this information carefully each time. Talk to your care team regarding the use of this medication in children. Special care may be needed. Overdosage: If you think you have taken too much of this medicine contact a poison control center or emergency room at once. NOTE: This medicine is only for you. Do not share this medicine with others. What if I miss a dose? If you miss a dose, take it as soon as you can. If it is almost time for your next dose, take only that dose. Do not take double or extra doses. What may interact with this medication? Do not  take this medication with any of the following: Certain medications for fungal infections like fluconazole, itraconazole, ketoconazole, posaconazole, voriconazole Cisapride Dronedarone Linezolid MAOIs like Carbex, Eldepryl, Marplan, Nardil, and Parnate Mesoridazine Methylene blue (injected into a vein) Pimozide Saquinavir Thioridazine This medication may also interact with the following: Alcohol Antiviral medications for HIV or AIDS Aspirin and aspirin-like medications Barbiturates like phenobarbital Certain medications for blood pressure, heart disease, irregular heart beat Certain medications for depression, anxiety, or psychotic disturbances Certain medications for migraine headache like almotriptan, eletriptan, frovatriptan, naratriptan, rizatriptan, sumatriptan, zolmitriptan Certain medications for seizures like carbamazepine and phenytoin Certain medications for sleep Certain medications that treat or prevent blood clots like dalteparin, enoxaparin, warfarin Digoxin Fentanyl Lithium NSAIDS, medications for pain and inflammation, like ibuprofen or naproxen Other medications that prolong the QT interval (cause an abnormal heart rhythm) like dofetilide Rasagiline Supplements like St. John's wort, kava kava, valerian Tramadol Tryptophan This list may not describe all possible interactions. Give your health care provider a list of all the medicines, herbs, non-prescription drugs, or dietary supplements you use. Also tell them if you smoke, drink alcohol, or use illegal drugs. Some items may interact with your medicine. What should I watch for while using this medication? Tell your care team if your symptoms do not get better or if they get worse. Visit your care team for regular checks on your progress. Because it may take several weeks to see the full effects of this medication, it is important to continue your treatment as prescribed by your care team. Watch for new or worsening  thoughts of  suicide or depression. This includes sudden changes in mood, behaviors, or thoughts. These changes can happen at any time but are more common in the beginning of treatment or after a change in dose. Call your care team right away if you experience these thoughts or worsening depression. Manic episodes may happen in patients with bipolar disorder who take this medication. Watch for changes in feelings or behaviors such as feeling anxious, nervous, agitated, panicky, irritable, hostile, aggressive, impulsive, severely restless, overly excited and hyperactive, or trouble sleeping. These changes can happen at any time but are more common in the beginning of treatment or after a change in dose. Call your care team right away if you notice any of these symptoms. You may get drowsy or dizzy. Do not drive, use machinery, or do anything that needs mental alertness until you know how this medication affects you. Do not stand or sit up quickly, especially if you are an older patient. This reduces the risk of dizzy or fainting spells. Alcohol may interfere with the effect of this medication. Avoid alcoholic drinks. This medication may cause dry eyes and blurred vision. If you wear contact lenses you may feel some discomfort. Lubricating drops may help. See your eye doctor if the problem does not go away or is severe. Your mouth may get dry. Chewing sugarless gum, sucking hard candy and drinking plenty of water may help. Contact your care team if the problem does not go away or is severe. What side effects may I notice from receiving this medication? Side effects that you should report to your care team as soon as possible: Allergic reactions--skin rash, itching, hives, swelling of the face, lips, tongue, or throat Bleeding--bloody or black, tar-like stools, red or dark brown urine, vomiting blood or brown material that looks like coffee grounds, small, red or purple spots on skin, unusual bleeding or  bruising Heart rhythm changes--fast or irregular heartbeat, dizziness, feeling faint or lightheaded, chest pain, trouble breathing Low blood pressure--dizziness, feeling faint or lightheaded, blurry vision Low sodium level--muscle weakness, fatigue, dizziness, headache, confusion Prolonged or painful erection Serotonin syndrome--irritability, confusion, fast or irregular heartbeat, muscle stiffness, twitching muscles, sweating, high fever, seizures, chills, vomiting, diarrhea Sudden eye pain or change in vision such as blurry vision, seeing halos around lights, vision loss Thoughts of suicide or self-harm, worsening mood, feelings of depression Side effects that usually do not require medical attention (report to your care team if they continue or are bothersome): Change in sex drive or performance Constipation Dizziness Drowsiness Dry mouth This list may not describe all possible side effects. Call your doctor for medical advice about side effects. You may report side effects to FDA at 1-800-FDA-1088. Where should I keep my medication? Keep out of the reach of children and pets. Store at room temperature between 15 and 30 degrees C (59 to 86 degrees F). Protect from light. Keep container tightly closed. Throw away any unused medication after the expiration date. NOTE: This sheet is a summary. It may not cover all possible information. If you have questions about this medicine, talk to your doctor, pharmacist, or health care provider.  2023 [employer] Standard (2019-12-10 00:00:00)  

## 2021-08-03 NOTE — Progress Notes (Signed)
SLEEP MEDICINE CLINIC    Provider:  Larey Seat, MD  Primary Care Physician:  Alejandro [name], [place] [address]     Referring Provider: Marin [name], [place],  Alejandro [name] [id number]          Chief Complaint according to patient   Patient presents with:     New Patient (Initial Visit)     Last seen 08/03/20. CPAP f/u. Takes off CPAP in sleep. Has nasal pillow as a mask.  I fall asleep OK but I can't stay asleep with it..        Interval history :  Alejandro [name] is a 61 year- old Caucasian male patient and here for a RV on 08-03-2021.  He takes sertraline  50 mg in M and has been able to fall asleep, but not stay asleep. He is using a nasal cradle, his sleep quality is " crap". His mother died in 04-04-2017 and after a prolonged illness, and that is the point in time he developed insomnia. He had previously reacted to the suicide of a friend in April 05, 2003, took xanax for months.  Chronic insomnia became  inability to stay asleep after 3 hours or so, he feels better in AM when he used it all night, but his sleep perception is that of not getting enough sleep.  I suggested to retry Trazodone ( not Hydroxyzine) at night.       08-03-2020. He reports using his new CPAP every night and he falls asleep now but removes the CPAP. He falls asleep on his back, and he loses the seal when moving to the right.   Alejandro [name] has made an excellent effort he has used CPAP 28 out of 33 days, he is using a lunar machine.  The maximum usage was 7 hours 27 minutes but there are multiple days where he did not quite make it to the 4-hour mark so 13 out of 33 days.  The mean pressure is 7.4 cmH2O the average P90 5 is 9.2 cmH2O and his average AHI is 3.7 his average CAI is 1.9 based on his baseline study this is a good resolution of apnea and has not let to an arousal of central apneas.  His Epworth sleepiness score is endorsed at 7 out of 24 points  today.  The patient described to me that he has noticed a tremor much restless writing or purposeful movements but sometimes while holding a sandwich or holding the remote control so there is a slight twitching between his thumb and index finger the abductor pollicis brevis seems to fasciculate.  He has noticed this with action but not at rest.  Has no resting tremor, normal finger to nose, and no ataxia. I have not been able to see fascicuations here and normal DTRs.  He has sometimes motion sickness with rapid turns, no headaches.    04/15/2020. His PSG still showed the presence of sleep apnea, and he has still insomnia, in spite of taking Zoloft.  Alejandro [name]  has a past medical history of Allergy, Central sleep apnea, Colon cancer ([place]), Complex sleep apnea syndrome, Complication of anesthesia, GERD (gastroesophageal reflux disease), HEMORRHOIDS-INTERNAL (10/01/2007), High cholesterol, PONV (postoperative nausea and vomiting), Psoriasis, and Right elbow pain (08/19/2015).   Alejandro [name] tested on 12-18-19 and he had a total sleep time of 260 minutes at night, delayed sleep latency was 14 minutes, he was able to gather 20%  of REM sleep and stage N3 which is slow-wave sleep sleep was 12.3% of the recorded night.  His AHI was 39.4 and actually not accentuated in rem.  There was a great difference between supine sleep and nonsupine sleep supine AHI was 64.3 nonsupine 22.7 he had 51 minutes spent below 89% oxygen saturation nadir was 72%.  This is a complex sleep apnea mostly hypopnea of mostly obstructive in origin.  Be therefore suggested CPAP use again that the patient has been working with his new machine which is a lunar device I looked at the last 30 days he has on average use the machine 3 hours 3 minutes maximum usage was 4 hours 32 minutes however he has only 6.7% of the time being able to be used more than 4 hours so he is currently in a phase of desensitization he is getting used to the  machine this is his first month with the machine, due to supply chain problems he was not able to get the machine and time earlier.  The mean pressure on 95th percentile pressure 10.8 cmH2O and the daily average air leak was only 40 minutes. We have less than 30 days of data now-   So his average residual central AI was 2.8 and he has about 3 obstructive events per hour.    What I would like to do is to change him to a mask that he described as likely more comfortable for him he has tried a full facemask with either an F 30 I or a DreamWear attachment and we switched today to just a nasal pillow or nasal cradle F 30 I. He was fitted here, in office.  He likes that the tube was on top and it does not restrict the ability to sleep on the side or even prone.  So I think he has made great effort and not to use the machine for a longer time and I am optimistic that he will come to 4 hours plus each night. With the new mask he may actually be able to have a shorter sleep latency as well the insomnia is not primarily related to his apnea it is a separate issue.  He does not feel rested or restored and often feels that he has slept less than he may on record have slept.  Further interval history- blurred vision, He has had an evaluation for vertigo, nausea, dizziness when bending forward, was seen by Alejandro [name] and [facility] , was told he doesn't have vertigo.  Vistibular migraine? This was raised by PT in vestibular rehab. When bending down he gets hot and clammy and nauseated, hands and feet feel cold and sweaty.    He stopped taking Omeprazole and started a supplement, probiotics, antihistamines and feels better- but still has a long time to fall asleep- this is primary insomnia , not related to apnea.    Was seen here in a consultation on 12-18-2019:   requested by Dr. Ansel Bong.  Chief concern according to patient :  'my mother struggled with interstittial lung disease, finbrosis and died with shortness of  breath- a sickness than progressed over 1 final year". I has similar reaction to a close friends death by suicide in March 27, 2002, used xanax for 3 month".   The patient had the first sleep study in the year 03/26/2012 with a result of mixed, somewhat complex apnea .   Sleep relevant medical history: history of untreated sleep apnea. Nightmares, insomnia. Used to dream he was falling,  woke up screaming. Family medical /sleep history: No other family member on CPAP with OSA, insomnia, no sleep walkers.    Social history:  Patient is working as Biochemist, clinical- low stress- no travel now,  and lives in a household with spouse, has 2 grown daughters. Younger one is aloof.  The patient currently works in daytime.Pets are present. Tobacco use- never. ETOH use : rare ,  Caffeine intake in form of Coffee( /) Soda( very little ) Tea ( not daily) or energy drinks. Regular exercise; tennis, golf.  Hobbies : walking, hiking.    Sleep habits are as follows: The patient's dinner time is between 7 PM. The patient goes to bed at 11.30 PM and struggles to sleep-once asleep, he continues to sleep for 5-6 hours, but wakes for 1 bathroom break.  Bowel movements  at 4-5 AM.  The preferred sleep position is either laterally or supine  , with the support of one pillow. Flat bed. GERD treated with Omeprazole.  Dreams are reportedly frequent/vivid. 5.30-6 AM is the usual rise time. The patient wakes up spontaneously.  He  reports not feeling refreshed or restored in AM, with symptoms such as residual fatigue.  Naps are not taken : l can't nap even if I wanted to"   Review of Systems: Out of a complete 14 system review, the patient complains of only the following symptoms, and all other reviewed systems are negative.:     Fatigue, sleepiness  fragmented sleep, Insomnia - trouble to go to sleep, sleep perception may be affected.   Possible hypoglycemia, Hydration ?   Vestibular sensitivity. " I can't look out the side of a window, I  throw up" Cyclic vomiting.  Could be THC related    No headaches.   Tremor - essential, and not persistent -  and he is right handed.       How likely are you to doze in the following situations: 0 = not likely, 1 = slight chance, 2 = moderate chance, 3 = high chance   Sitting and Reading? Watching Television? Sitting inactive in a public place (theater or meeting)? As a passenger in a car for an hour without a break? Lying down in the afternoon when circumstances permit? Sitting and talking to someone? Sitting quietly after lunch without alcohol? In a car, while stopped for a few minutes in traffic?   Total = 13 now from pre CPAP- 15/ 24 points   FSS endorsed at 32/ 63 points.   Social History   Socioeconomic History   Marital status: Married    Spouse name: Not on file   Number of children: 2   Years of education: BS   Highest education level: Not on file  Occupational History    Comment: [employer]  Tobacco Use   Smoking status: Never   Smokeless tobacco: Never  Vaping Use   Vaping Use: Never used  Substance and Sexual Activity   Alcohol use: Yes    Alcohol/week: 3.0 - 4.0 standard drinks of alcohol    Types: 3 - 4 Cans of beer per week    Comment: 3 - 4 beers during some weeks    Drug use: THC   Sexual activity: Yes  Other Topics Concern   Not on file  Social History Narrative   Married. 2 grown daughters-  one in [place] and one in [place] (24) in 2022.       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)      Hobbies: sports activites- golf, tennis, frisbee golf    Social Determinants of Health   Financial Resource Strain: Not on file  Food Insecurity: Not on file  Transportation Needs: Not on file  Physical Activity: Not on file  Stress: Not on file  Social Connections: Not on file    Family History  Problem Relation Age of Onset   Sleep  disorder Father    Hyperlipidemia Father    Sleep disorder Paternal Grandfather    Heart disease Paternal Grandfather    Cancer Paternal Grandmother        breast   Breast cancer Mother    Alcohol abuse Mother    Eating disorder Mother    Diabetes Maternal Grandmother    Heart disease Maternal Grandfather        late life   Hyperlipidemia Maternal Grandfather    Stroke Maternal Grandfather        late life    Past Medical History:  Diagnosis Date   Allergy    Central sleep apnea    AHI of 44 exacerbated to 52 and failed CPAP   Colon cancer ([place])    polpy removed from colon that was cancerous   Complex sleep apnea syndrome    baseline AHI of 44 on 04-21-11, changed to adapt SV     Complication of anesthesia    Patient has hard time waking up   GERD (gastroesophageal reflux disease)    HEMORRHOIDS-INTERNAL 10/01/2007   Qualifier: Diagnosis of  By: Fuller Plan MD Marijo Conception    High cholesterol    PONV (postoperative nausea and vomiting)    Psoriasis    Right elbow pain 08/19/2015    Past Surgical History:  Procedure Laterality Date   COLONOSCOPY  2009   CANCEROUS POLYPS REMOVED   KNEE ARTHROSCOPY Left 1979   KNEE ARTHROSCOPY Right 08/22/2012   Procedure: RIGHT KNEE ARTHROSCOPY WITH DEBRIDEMENT ;  Surgeon: Alejandro Shutter, MD;  Location: [facility];  Service: Orthopedics;  Laterality: Right;   WRIST FRACTURE SURGERY Left ~ 1995     Current Outpatient Medications on File Prior to Visit  Medication Sig Dispense Refill   ALPRAZolam (XANAX) 0.25 MG tablet Take 1 tablet (0.25 mg total) by mouth daily as needed for anxiety (only to be used as needed for vestibular/ cyclic vomiting.). Prn for vestibular/ cyclic vomiting. 30 tablet 1   atorvastatin (LIPITOR) 40 MG tablet Take 1 tablet (40 mg total) by mouth daily. 90 tablet 3   azelastine (ASTELIN) 0.1 % nasal spray Place 2 sprays into both nostrils 2 (two) times daily. 30 mL 12   betamethasone dipropionate 0.05 % lotion APPLY LOTION  TOPICALLY ONCE DAILY TO SCALP AND EARS     Calcipotriene 0.005 % solution APPLY  SOLUTION TOPICALLY TWICE DAILY 60 mL 0   clotrimazole-betamethasone (LOTRISONE) cream APPLY 1 APPLICATION TOPICALLY 2 TIMES DAILY 30 g 0   fluticasone (CUTIVATE) 5.00 % cream 1 application.     hydrocortisone 2.5 % ointment APPLY OINTMENT EXTERNALLY ONCE DAILY TO FACE AND GENITALS     ipratropium (ATROVENT) 0.06 % nasal spray Place 2 sprays into both nostrils 4 (four) times daily. 15 mL 12   Multiple Vitamin (MULTIVITAMIN ADULT) TABS 1 tablet     omeprazole (PRILOSEC) 20 MG capsule 1 capsule 30 minutes before morning meal     ondansetron (ZOFRAN) 4 MG tablet Take 4 mg by mouth  every 8 (eight) hours as needed for nausea or vomiting.     scopolamine (TRANSDERM-SCOP) 1 MG/3DAYS Place 1 patch (1.5 mg total) onto the skin every 3 (three) days as needed (For motion sickness). 10 patch 0   sertraline (ZOLOFT) 50 MG tablet TAKE 1 TABLET BY MOUTH AT BEDTIME 30 tablet 10   No current facility-administered medications on file prior to visit.    Allergies  Allergen Reactions   Corticosteroids     Other reaction(s): Other (See Comments) Blurry vision- blisters behind retinas Blurry vision- blisters behind retinas   Cortisone    Depo-Medrol [Methylprednisolone Sodium Succ] Other (See Comments)    Blurry vision- blisters behind retinas   Other Other (See Comments)    Does/t likt narcotics and cannot take steroids  Blurry vision- blisters behind retinas    Codeine Other (See Comments)    REACTION: nausea, vomiting ABD PAIN REACTION: nausea, vomiting Other reaction(s): Other (See Comments), stomach cramps ABD PAIN REACTION: nausea, vomiting REACTION: nausea, vomiting ABD PAIN REACTION: nausea, vomiting    Prednisone     Other reaction(s): blisters on retina    Physical exam:  Today's Vitals   08/03/21 0922  BP: 130/74  Pulse: 61  Weight: 166 lb (75.3 kg)  Height: '5\' 5"'$  (1.651 m)   Body mass index is  27.62 kg/m.   Wt Readings from Last 3 Encounters:  08/03/21 166 lb (75.3 kg)  02/16/21 164 lb 2 oz (74.4 kg)  12/07/20 164 lb (74.4 kg)     Ht Readings from Last 3 Encounters:  08/03/21 '5\' 5"'$  (1.651 m)  02/16/21 '5\' 5"'$  (1.651 m)  12/07/20 '5\' 5"'$  (1.651 m)      General: The patient is awake, alert and appears not in acute distress. The patient is well groomed. Head: Normocephalic, atraumatic.  Neck is supple. Mallampati 2-3,  neck circumference:16.5 inches .  Nasal airflow congested- deviated septum -  Retrognathia is mild .  Dental status: intact  Cardiovascular:  Regular rate and cardiac rhythm by pulse,  without distended neck veins. Respiratory: Lungs are clear to auscultation.  Skin:  Without evidence of ankle edema, or rash. Trunk: The patient's posture is erect.   Neurologic exam : The patient is awake and alert, oriented to place and time.   Memory subjective described as intact.  Attention span & concentration ability appears normal.  Speech is fluent,  without  dysarthria, dysphonia or aphasia.  Mood and affect are appropriate.   Cranial nerves: no loss of smell or taste reported  Pupils are equal and briskly reactive to light. Funduscopic exam deferred.  Extraocular movements in vertical and horizontal planes were intact and with endpoint nystagmus only on the left . No Diplopia. Visual fields by finger perimetry are intact. Hearing was intact to soft voice and finger rubbing.   Facial sensation intact to fine touch. Facial motor strength is symmetric and tongue and uvula move midline.  Neck ROM : rotation, tilt and flexion extension were normal for age and shoulder shrug was symmetrical.  He reports frequent neck pain, forcing him to sleep more supine.    Motor exam:  Symmetric bulk, tone and ROM.   Normal tone without cog- wheeling, symmetric grip strength .   Sensory: Vibration sense remains normal.  Proprioception tested in the upper extremities was normal.    Coordination: Rapid alternating movements in the fingers/hands were of normal speed.  The Finger-to-nose maneuver was intact. He has no cogwheeling and no visible tremor.  Gait and station: Patient could rise unassisted from a seated position, walked without assistive device.  Stance is of normal width/ base and the patient turned with 3 steps.  Toe and heel walk were deferred.  Deep tendon reflexes: in the  upper and lower extremities are symmetric and intact.  Babinski response was deferred.      After spending a total time of 42mnutes face to face and additional time for physical and neurologic examination, review of laboratory studies,  personal review of imaging studies, reports and results of other testing and review of referral information / records as far as provided in visit, I have established the following assessments:  Insomnia and apnea, working up to 4 hours or more with a new CPAP interface. Tremor when holding on to a sandwich the remote, but not affecting ability to type , to function , no reported impairment- Tremogram benign.     My Plan is to proceed with:  1) Zoloft for 50 mg AM to continue. Added trazodone at night 25 mg 1/2 tab.  2) CPAP has been working in reduction of apnea. He changed to a nasal pillow ResMed P30! M. He uses it almost every day but still looses the mask at night- further  reduced compliance .  3) try Xanax 08.31mg for cyclic vomiting- he has not needed it.  4) Tremor - essential, not enough impairment  to warrant any intervention.       I would like to thank HMarin Olp Md [address] [id number] allowing me to meet with and to take care of this pleasant patient.   In short, Romond JQuinlan Vollmeris presenting with sleep initiation insomnia and untreated apnea.  I plan to follow up either personally or through our NP within 12 months.   CC: I will share my notes with PCP.  Electronically signed  by: CLarey Seat MD 08/03/2021 9:45 AM  [facility] and PAflac IncorporatedBoard certified by The [employer]of Sleep Medicine and Diplomate of the [employer]of Sleep Medicine. Board certified In Neurology through the [place] Fellow of the [employer]of Neurology. Medical Director of [employer]

## 2021-08-04 ENCOUNTER — Ambulatory Visit: Payer: BC Managed Care – PPO | Admitting: Neurology

## 2021-08-09 DIAGNOSIS — I69998 Other sequelae following unspecified cerebrovascular disease: Secondary | ICD-10-CM | POA: Diagnosis not present

## 2021-08-09 DIAGNOSIS — H2513 Age-related nuclear cataract, bilateral: Secondary | ICD-10-CM | POA: Diagnosis not present

## 2021-08-09 DIAGNOSIS — H11153 Pinguecula, bilateral: Secondary | ICD-10-CM | POA: Diagnosis not present

## 2021-08-09 DIAGNOSIS — H40013 Open angle with borderline findings, low risk, bilateral: Secondary | ICD-10-CM | POA: Diagnosis not present

## 2021-09-16 ENCOUNTER — Other Ambulatory Visit: Payer: Self-pay | Admitting: Neurology

## 2021-09-20 DIAGNOSIS — M25562 Pain in left knee: Secondary | ICD-10-CM | POA: Diagnosis not present

## 2021-09-22 ENCOUNTER — Other Ambulatory Visit: Payer: Self-pay | Admitting: *Deleted

## 2021-09-22 DIAGNOSIS — M25562 Pain in left knee: Secondary | ICD-10-CM | POA: Diagnosis not present

## 2021-09-22 MED ORDER — TRAZODONE HCL 50 MG PO TABS: ORAL_TABLET | ORAL | 5 refills | Status: DC

## 2021-09-26 ENCOUNTER — Encounter: Payer: Self-pay | Admitting: *Deleted

## 2021-09-26 ENCOUNTER — Other Ambulatory Visit: Payer: Self-pay | Admitting: Family Medicine

## 2021-09-26 DIAGNOSIS — M25562 Pain in left knee: Secondary | ICD-10-CM | POA: Diagnosis not present

## 2021-10-10 ENCOUNTER — Other Ambulatory Visit: Payer: Self-pay | Admitting: Neurology

## 2021-10-10 ENCOUNTER — Telehealth: Payer: Self-pay | Admitting: Neurology

## 2021-10-10 NOTE — Telephone Encounter (Signed)
Rx trazodone '50mg'$  last sent 09/22/21 #30, 5 refills. Refills should be on file at the pharmacy.

## 2021-10-10 NOTE — Telephone Encounter (Signed)
Pt states re: his traZODone (DESYREL) 50 MG tablet he originally started then medication as 1/2 a tablet and then make his way to a full tablet,  Based on how he originally started on this medication it has thrown off his refill timing.  Pt states he is going out of town the early morning of the 13th so he is asking to be able to fill this  by the afternoon/evening of the 12th at [place]

## 2021-10-10 NOTE — Telephone Encounter (Signed)
Called pt. He states he is trying to get early fill on 10/12/21. Pharmacy telling him he cannot fill until 10/15/21. Aware Casey,RN reaching out to pharmacy to get more info. She will call him back after to provide update. He states it is ok to LVM.

## 2021-10-10 NOTE — Telephone Encounter (Signed)
Called the pt's pharmacy. Last filled 09/22/2021 and the soonest that he can have it filled is 09/19/2021. They needed it to have the green light to approve an early refill. Advised we would provide this for the patient since he will be out of town when it is due.  The technician said she will get this taken care of for him.  Called the patient to advise of this and he was informed of the instructions on how to take this medication and that the technician will have it ready for him at [facility].

## 2021-10-30 ENCOUNTER — Other Ambulatory Visit: Payer: Self-pay | Admitting: Neurology

## 2021-10-31 ENCOUNTER — Telehealth: Payer: Self-pay | Admitting: Neurology

## 2021-10-31 NOTE — Telephone Encounter (Signed)
Pt is asking that a couple of refills be added to this Rx for Sertraline HCl 50 MG ,

## 2021-11-23 ENCOUNTER — Other Ambulatory Visit: Payer: Self-pay | Admitting: Neurology

## 2021-12-08 ENCOUNTER — Ambulatory Visit (INDEPENDENT_AMBULATORY_CARE_PROVIDER_SITE_OTHER): Payer: BC Managed Care – PPO | Admitting: Family Medicine

## 2021-12-08 ENCOUNTER — Encounter: Payer: Self-pay | Admitting: Family Medicine

## 2021-12-08 VITALS — BP 122/72 | HR 58 | Temp 98.0°F | Ht 65.0 in | Wt 167.6 lb

## 2021-12-08 DIAGNOSIS — Z130 Encounter for screening for diseases of the blood and blood-forming organs and certain disorders involving the immune mechanism: Secondary | ICD-10-CM

## 2021-12-08 DIAGNOSIS — Z0000 Encounter for general adult medical examination without abnormal findings: Secondary | ICD-10-CM

## 2021-12-08 DIAGNOSIS — Z125 Encounter for screening for malignant neoplasm of prostate: Secondary | ICD-10-CM

## 2021-12-08 DIAGNOSIS — R3915 Urgency of urination: Secondary | ICD-10-CM | POA: Diagnosis not present

## 2021-12-08 DIAGNOSIS — Z13220 Encounter for screening for lipoid disorders: Secondary | ICD-10-CM

## 2021-12-08 LAB — COMPREHENSIVE METABOLIC PANEL
ALT: 25 U/L (ref 0–53)
AST: 20 U/L (ref 0–37)
Albumin: 4.6 g/dL (ref 3.5–5.2)
Alkaline Phosphatase: 142 U/L — ABNORMAL HIGH (ref 39–117)
BUN: 18 mg/dL (ref 6–23)
CO2: 29 mEq/L (ref 19–32)
Calcium: 9.4 mg/dL (ref 8.4–10.5)
Chloride: 105 mEq/L (ref 96–112)
Creatinine, Ser: 0.83 mg/dL (ref 0.40–1.50)
GFR: 95.65 mL/min (ref >60.00)
Glucose, Bld: 97 mg/dL (ref 70–99)
Potassium: 4.3 mEq/L (ref 3.5–5.1)
Sodium: 142 mEq/L (ref 135–145)
Total Bilirubin: 0.4 mg/dL (ref 0.2–1.2)
Total Protein: 6.8 g/dL (ref 6.0–8.3)

## 2021-12-08 LAB — CBC WITH DIFFERENTIAL/PLATELET
Basophils Absolute: 0 10*3/uL (ref 0.0–0.1)
Basophils Relative: 0.8 % (ref 0.0–3.0)
Eosinophils Absolute: 0.2 10*3/uL (ref 0.0–0.7)
Eosinophils Relative: 3.3 % (ref 0.0–5.0)
HCT: 42.5 % (ref 39.0–52.0)
Hemoglobin: 14.5 g/dL (ref 13.0–17.0)
Lymphocytes Relative: 14.9 % (ref 12.0–46.0)
Lymphs Abs: 0.9 10*3/uL (ref 0.7–4.0)
MCHC: 34.1 g/dL (ref 30.0–36.0)
MCV: 85.5 fl (ref 78.0–100.0)
Monocytes Absolute: 0.4 10*3/uL (ref 0.1–1.0)
Monocytes Relative: 7.6 % (ref 3.0–12.0)
Neutro Abs: 4.4 10*3/uL (ref 1.4–7.7)
Neutrophils Relative %: 73.4 % (ref 43.0–77.0)
Platelets: 244 10*3/uL (ref 150.0–400.0)
RBC: 4.97 Mil/uL (ref 4.22–5.81)
RDW: 13.9 % (ref 11.5–15.5)
WBC: 5.9 10*3/uL (ref 4.0–10.5)

## 2021-12-08 LAB — URINALYSIS, ROUTINE W REFLEX MICROSCOPIC
Bilirubin Urine: NEGATIVE
Hgb urine dipstick: NEGATIVE
Ketones, ur: NEGATIVE
Leukocytes,Ua: NEGATIVE
Nitrite: NEGATIVE
Specific Gravity, Urine: 1.02 (ref 1.000–1.030)
Total Protein, Urine: NEGATIVE
Urine Glucose: NEGATIVE
Urobilinogen, UA: 0.2 (ref 0.0–1.0)
pH: 7 (ref 5.0–8.0)

## 2021-12-08 LAB — LIPID PANEL
Cholesterol: 202 mg/dL — ABNORMAL HIGH (ref 0–200)
HDL: 57.3 mg/dL (ref >39.00)
LDL Cholesterol: 123 mg/dL — ABNORMAL HIGH (ref 0–99)
NonHDL: 144.65
Total CHOL/HDL Ratio: 4
Triglycerides: 107 mg/dL (ref 0.0–149.0)
VLDL: 21.4 mg/dL (ref 0.0–40.0)

## 2021-12-08 LAB — PSA: PSA: 0.67 ng/mL (ref 0.10–4.00)

## 2021-12-08 NOTE — Patient Instructions (Addendum)
Please stop by lab before you go If you have mychart- we will send your results within 3 business days of [place] receiving them.  If you do not have mychart- we will call you about results within 5 business days of [place] receiving them.  *please also note that you will see labs on mychart as soon as they post. I will later go in and write notes on them- will say "notes from Dr. Yong Channel"    Recommended follow up: Return in about 1 year (around 12/09/2022) for physical or sooner if needed.Schedule b4 you leave.

## 2021-12-08 NOTE — Progress Notes (Signed)
Phone: [phone]   Subjective:  Patient presents today for their annual physical. Chief complaint-noted.   See problem oriented charting- ROS- full  review of systems was completed and negative  except for: urinary urgency, hesitancy, knee pain  The following were reviewed and entered/updated in epic: Past Medical History:  Diagnosis Date   Allergy    Central sleep apnea    AHI of 44 exacerbated to 52 and failed CPAP   Colon cancer (HCC)    polpy removed from colon that was cancerous   Complex sleep apnea syndrome    baseline AHI of 44 on 04-21-11, changed to adapt SV     Complication of anesthesia    Patient has hard time waking up   GERD (gastroesophageal reflux disease)    HEMORRHOIDS-INTERNAL 10/01/2007   Qualifier: Diagnosis of  By: Fuller Plan MD Lamont Snowball T    High cholesterol    PONV (postoperative nausea and vomiting)    Psoriasis    Right elbow pain 08/19/2015   Patient Active Problem List   Diagnosis Date Noted   Psoriasis 02/22/2016    Priority: High   Severe motion sickness 02/22/2016    Priority: High   History of colon cancer 10/01/2007    Priority: High   Benign essential tremor 08/03/2020    Priority: Medium    Cyclical vomiting syndrome not associated with migraine 04/15/2020    Priority: Medium    Insomnia disorder, with other sleep disorder, recurrent 04/15/2020    Priority: Medium    Complex sleep apnea syndrome 12/19/2019    Priority: Medium    Syncope 06/14/2017    Priority: Medium    Groin rash 02/22/2016    Priority: Medium    Hyperhidrosis of palms and soles 02/22/2016    Priority: Medium    Hyperlipidemia 09/05/2013    Priority: Medium    GERD (gastroesophageal reflux disease) 09/05/2013    Priority: Medium    Sleep apnea 09/28/2011    Priority: Medium    Severe obstructive sleep apnea-hypopnea syndrome 04/15/2020    Priority: Low   Intolerance of continuous positive airway pressure (CPAP) ventilation 12/19/2019    Priority: Low    DDD (degenerative disc disease), lumbar 10/21/2019    Priority: Low   Prolonged P-R interval 07/25/2017    Priority: Low   Left hamstring muscle strain 12/08/2014    Priority: Low   Vestibular disequilibrium 04/15/2020   Past Surgical History:  Procedure Laterality Date   COLONOSCOPY  2009   CANCEROUS POLYPS REMOVED   KNEE ARTHROSCOPY Left 1979   KNEE ARTHROSCOPY Right 08/22/2012   Procedure: RIGHT KNEE ARTHROSCOPY WITH DEBRIDEMENT ;  Surgeon: Marin Shutter, MD;  Location: [facility];  Service: Orthopedics;  Laterality: Right;   WRIST FRACTURE SURGERY Left ~ 1995    Family History  Problem Relation Age of Onset   Sleep disorder Father    Hyperlipidemia Father    Sleep disorder Paternal Grandfather    Heart disease Paternal Grandfather    Cancer Paternal Grandmother        breast   Breast cancer Mother    Alcohol abuse Mother    Eating disorder Mother    Diabetes Maternal Grandmother    Heart disease Maternal Grandfather        late life   Hyperlipidemia Maternal Grandfather    Stroke Maternal Grandfather        late life    Medications- reviewed and updated Current Outpatient Medications  Medication Sig Dispense Refill  ALPRAZolam (XANAX) 0.25 MG tablet Take 1 tablet (0.25 mg total) by mouth daily as needed for anxiety (only to be used as needed for vestibular/ cyclic vomiting.). Prn for vestibular/ cyclic vomiting. 30 tablet 1   atorvastatin (LIPITOR) 40 MG tablet Take 1 tablet (40 mg total) by mouth daily. 90 tablet 3   betamethasone dipropionate 0.05 % lotion APPLY LOTION TOPICALLY ONCE DAILY TO SCALP AND EARS     Calcipotriene 0.005 % solution APPLY  SOLUTION TOPICALLY TWICE DAILY 60 mL 0   clotrimazole-betamethasone (LOTRISONE) cream APPLY  CREAM TOPICALLY TWICE DAILY 30 g 0   fluticasone (CUTIVATE) 0.05 % cream APPLY  CREAM TOPICALLY TWICE DAILY 30 g 0   hydrocortisone 2.5 % ointment APPLY OINTMENT EXTERNALLY ONCE DAILY TO FACE AND GENITALS     omeprazole  (PRILOSEC) 20 MG capsule 1 capsule 30 minutes before morning meal     ondansetron (ZOFRAN) 4 MG tablet Take 4 mg by mouth every 8 (eight) hours as needed for nausea or vomiting.     scopolamine (TRANSDERM-SCOP) 1 MG/3DAYS Place 1 patch (1.5 mg total) onto the skin every 3 (three) days as needed (For motion sickness). 10 patch 0   sertraline (ZOLOFT) 50 MG tablet TAKE 1 TABLET BY MOUTH AT BEDTIME 30 tablet 0   No current facility-administered medications for this visit.    Allergies-reviewed and updated Allergies  Allergen Reactions   Corticosteroids     Other reaction(s): Other (See Comments) Blurry vision- blisters behind retinas Blurry vision- blisters behind retinas   Cortisone    Depo-Medrol [Methylprednisolone Sodium Succ] Other (See Comments)    Blurry vision- blisters behind retinas   Other Other (See Comments)    Does/t likt narcotics and cannot take steroids  Blurry vision- blisters behind retinas    Codeine Other (See Comments)    REACTION: nausea, vomiting ABD PAIN REACTION: nausea, vomiting Other reaction(s): Other (See Comments), stomach cramps ABD PAIN REACTION: nausea, vomiting REACTION: nausea, vomiting ABD PAIN REACTION: nausea, vomiting    Prednisone     Other reaction(s): blisters on retina   Trazodone And Nefazodone     Motion sickness issues if didn't fall asleep by time kicked in     Social History   Social History Narrative   Married. 2 grown daughters-  one in [place] ([employer] grad) and one in [place] (23) in 2022.       BA at [employer] major. 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 Oropharynx normal Neck: no  thyromegaly CV: RRR no murmurs rubs or gallops Lungs: CTAB no crackles, wheeze, rhonchi Abdomen: soft/nontender/nondistended/normal bowel sounds. No rebound or guarding.  Ext: no edema Skin: warm, dry Neuro: grossly normal, moves all extremities, PERRLA   Assessment and Plan  61 y.o. male presenting for annual physical.  Health Maintenance counseling: 1. Anticipatory guidance: Patient counseled regarding regular dental exams -q6 months, eye exams -yearly,  avoiding smoking and second hand smoke , limiting alcohol to 2 beverages per day , no illicit drugs .   2. Risk factor reduction:  Advised patient of need  for regular exercise and diet rich and fruits and vegetables to reduce risk of heart attack and stroke.  Exercise-some walking/hiking/Frisbee golf, regular golf- has been reduced but with knee doing better plans to increase.  Diet/weight management-weight up 3 pounds today-discussed gradual weight loss and healthy diet- some of this was decreased activity.  -thinks could cut down on friend food and ice cream. Does good on fruits/veggies.  Wt Readings from Last 3 Encounters:  12/08/21 167 lb 9.6 oz (76 kg)  08/03/21 166 lb (75.3 kg)  02/16/21 164 lb 2 oz (74.4 kg)  3. Immunizations/screenings/ancillary studies-fully up-to-date Immunization History  Administered Date(s) Administered   Influenza, Quadrivalent, Recombinant, Inj, Pf 09/05/2017, 10/05/2018   Influenza,inj,Quad PF,6+ Mos 11/16/2020, 10/03/2021   Influenza-Unspecified 09/21/2015, 10/25/2016, 09/05/2017, 10/05/2018, 09/24/2019   Moderna SARS-COV2 Booster Vaccination 10/31/2021   PFIZER(Purple Top)SARS-COV-2 Vaccination 03/20/2019, 04/10/2019, 04/01/2020   Pfizer Covid-19 Vaccine Bivalent Booster 82yr & up 11/08/2020   Tdap 06/03/2006, 01/01/2017   Zoster Recombinat (Shingrix) 02/18/2019, 06/03/2019  4. Prostate cancer screening- slight increase in PSA trend last year-check again today and if up more than 0.35 likely refer  to urology but from 2019-2022 was only up 0.35 -brother in law diagnosed with prostate cancer in later stages. No nocturia.  - still with some urinary urgency and frequency -declines rectal exam Lab Results  Component Value Date   PSA 1.33 12/07/2020   PSA 0.73 10/21/2019   PSA 0.98 08/17/2017   5. Colon cancer screening - 08/06/2018- due now with [facility] but doctor retired-Dr. OPaulita Fujitawill be new doct- getting records transferred 6. [facility] JMickel Duhamelregular sunscreen use. Denies worrisome, changing, or new skin lesions.  7. Smoking associated screening (lung cancer screening, AAA screen 65-75, UA)-never smoker 8. STD screening - opts out as monogamous  Status of chronic or acute concerns   # OSA on CPAP with nasal pillow-working with Dr. DBrett Fairy not going great- wakes up with it off - Seen in August and continued on Zoloft 50 mg in the morning with trazodone started at night to see if it would help- didn't help tremendously - when he was awake when kicked in - had severe motion sickness -174-monthollow-up planned  # Severe motion sickness-Xanax has been prescribed by Dr. DoBrett Fairyut he has not needed it- luckily has been doing ok   # Essential tremor-not severe enough for intervention at this time   # Left knee pain-complex tear posterior horn of medial meniscus on MRI and will intentionally need arthroscopic medial meniscectomy-but wants to monitor for now  #hyperlipidemia S: Medication:Drastic increase in lipids off of atorvastatin-started back atorvastatin 40 mg after last year Lab Results  Component Value Date   CHOL 303 (H) 12/07/2020   HDL 62.30 12/07/2020   LDLCALC 216 (H) 12/07/2020   TRIG 123.0 12/07/2020   CHOLHDL 5 12/07/2020   A/P: Suspect significantly improved-update lipid panel with labs- continue current medications    # Psoriasis-stable calcipotriene helps with psoriasis in the scalp, Lotrisone helps when he has  issues in the penis or jock itch, has fluticasone cream and hydrochlorothiazide 2.5% as well if needed for itchiness   # Low back pain-degenerative disc disease per Dr. BeTonita Congeported in the past.  Worse with regular activities, tries to do intermittent stretching at least and [place]-deep tissue massage very helpful- doing well lately  Recommended follow up: Return in about 1 year (around 12/09/2022) for physical or sooner if needed.Schedule b4 you leave. Future Appointments  Date  Time Provider [place]  08/09/2022 10:00 AM Ward Givens, NP [facility] None   Lab/Order associations: fasting   ICD-10-CM   1. Preventative health care  Z00.00 CBC with Differential/Platelet    Comprehensive metabolic panel    Lipid panel    PSA    Urinalysis, Routine w reflex microscopic    2. Screening for prostate cancer  Z12.5 PSA    3. Urinary urgency  R39.15     4. Screening for hyperlipidemia  Z13.220 Comprehensive metabolic panel    Lipid panel    5. Screening, iron deficiency anemia  Z13.0 CBC with Differential/Platelet      No orders of the defined types were placed in this encounter.   Return precautions advised.  Garret Reddish, MD

## 2021-12-31 ENCOUNTER — Other Ambulatory Visit: Payer: Self-pay | Admitting: Neurology

## 2022-02-01 ENCOUNTER — Telehealth: Payer: Self-pay | Admitting: Family Medicine

## 2022-02-01 NOTE — Telephone Encounter (Signed)
Error

## 2022-02-03 DIAGNOSIS — R112 Nausea with vomiting, unspecified: Secondary | ICD-10-CM | POA: Diagnosis not present

## 2022-02-06 ENCOUNTER — Other Ambulatory Visit: Payer: Self-pay | Admitting: Family Medicine

## 2022-02-06 ENCOUNTER — Ambulatory Visit: Payer: BC Managed Care – PPO | Admitting: Family Medicine

## 2022-02-06 ENCOUNTER — Encounter: Payer: Self-pay | Admitting: Family Medicine

## 2022-02-06 VITALS — BP 100/60 | HR 58 | Temp 97.0°F | Ht 65.0 in | Wt 168.0 lb

## 2022-02-06 DIAGNOSIS — E559 Vitamin D deficiency, unspecified: Secondary | ICD-10-CM

## 2022-02-06 DIAGNOSIS — R61 Generalized hyperhidrosis: Secondary | ICD-10-CM

## 2022-02-06 DIAGNOSIS — R748 Abnormal levels of other serum enzymes: Secondary | ICD-10-CM

## 2022-02-06 DIAGNOSIS — R6882 Decreased libido: Secondary | ICD-10-CM

## 2022-02-06 LAB — VITAMIN D 25 HYDROXY (VIT D DEFICIENCY, FRACTURES): VITD: 14.54 ng/mL — ABNORMAL LOW (ref 30.00–100.00)

## 2022-02-06 LAB — COMPREHENSIVE METABOLIC PANEL
ALT: 18 U/L (ref 0–53)
AST: 19 U/L (ref 0–37)
Albumin: 4.6 g/dL (ref 3.5–5.2)
Alkaline Phosphatase: 145 U/L — ABNORMAL HIGH (ref 39–117)
BUN: 18 mg/dL (ref 6–23)
CO2: 28 mEq/L (ref 19–32)
Calcium: 9.6 mg/dL (ref 8.4–10.5)
Chloride: 106 mEq/L (ref 96–112)
Creatinine, Ser: 0.79 mg/dL (ref 0.40–1.50)
GFR: 96.97 mL/min (ref >60.00)
Glucose, Bld: 102 mg/dL — ABNORMAL HIGH (ref 70–99)
Potassium: 4.3 mEq/L (ref 3.5–5.1)
Sodium: 141 mEq/L (ref 135–145)
Total Bilirubin: 0.4 mg/dL (ref 0.2–1.2)
Total Protein: 6.8 g/dL (ref 6.0–8.3)

## 2022-02-06 LAB — CBC WITH DIFFERENTIAL/PLATELET
Basophils Absolute: 0 10*3/uL (ref 0.0–0.1)
Basophils Relative: 0.9 % (ref 0.0–3.0)
Eosinophils Absolute: 0.1 10*3/uL (ref 0.0–0.7)
Eosinophils Relative: 3.2 % (ref 0.0–5.0)
HCT: 43.2 % (ref 39.0–52.0)
Hemoglobin: 14.7 g/dL (ref 13.0–17.0)
Lymphocytes Relative: 20.7 % (ref 12.0–46.0)
Lymphs Abs: 0.9 10*3/uL (ref 0.7–4.0)
MCHC: 34.1 g/dL (ref 30.0–36.0)
MCV: 86.2 fl (ref 78.0–100.0)
Monocytes Absolute: 0.4 10*3/uL (ref 0.1–1.0)
Monocytes Relative: 9.5 % (ref 3.0–12.0)
Neutro Abs: 2.8 10*3/uL (ref 1.4–7.7)
Neutrophils Relative %: 65.7 % (ref 43.0–77.0)
Platelets: 248 10*3/uL (ref 150.0–400.0)
RBC: 5.01 Mil/uL (ref 4.22–5.81)
RDW: 13.5 % (ref 11.5–15.5)
WBC: 4.2 10*3/uL (ref 4.0–10.5)

## 2022-02-06 LAB — URINALYSIS, ROUTINE W REFLEX MICROSCOPIC
Bilirubin Urine: NEGATIVE
Hgb urine dipstick: NEGATIVE
Ketones, ur: NEGATIVE
Leukocytes,Ua: NEGATIVE
Nitrite: NEGATIVE
RBC / HPF: NONE SEEN (ref 0–?)
Specific Gravity, Urine: 1.02 (ref 1.000–1.030)
Total Protein, Urine: NEGATIVE
Urine Glucose: NEGATIVE
Urobilinogen, UA: 0.2 (ref 0.0–1.0)
pH: 7 (ref 5.0–8.0)

## 2022-02-06 LAB — C-REACTIVE PROTEIN: CRP: <1 mg/dL (ref 0.5–20.0)

## 2022-02-06 LAB — TSH: TSH: 1.21 u[IU]/mL (ref 0.35–5.50)

## 2022-02-06 MED ORDER — LANCETS MISC. MISC: 1.0000 | Freq: Three times a day (TID) | 0 refills | Status: AC

## 2022-02-06 MED ORDER — VITAMIN D (ERGOCALCIFEROL) 1.25 MG (50000 UNIT) PO CAPS: 50000.0000 [IU] | ORAL_CAPSULE | ORAL | 1 refills | Status: AC

## 2022-02-06 MED ORDER — BLOOD GLUCOSE TEST VI STRP: 1.0000 | ORAL_STRIP | Freq: Three times a day (TID) | 0 refills | Status: AC

## 2022-02-06 MED ORDER — LANCET DEVICE MISC: 1.0000 | Freq: Three times a day (TID) | 0 refills | Status: AC

## 2022-02-06 MED ORDER — BLOOD GLUCOSE MONITORING SUPPL DEVI: 1.0000 | Freq: Three times a day (TID) | 0 refills | Status: DC

## 2022-02-06 NOTE — Patient Instructions (Addendum)
-  check blood pressure and temperature with next episode -if you feel poorly- lets check sugar- want over 70 at least and prefer over 80  -possible links with sertraline and omeprazole- we may need to adjust these depending on how things go  Please stop by lab before you go If you have mychart- we will send your results within 3 business days of [place] receiving them.  If you do not have mychart- we will call you about results within 5 business days of [place] receiving them.  *please also note that you will see labs on mychart as soon as they post. I will later go in and write notes on them- will say "notes from Dr. Yong Channel"   Recommended follow up: Return for as needed for new, worsening, persistent symptoms. Even if all labs come back ok- if you have new or worsening symptoms please let us know particularly unintentional weight loss or fevers

## 2022-02-06 NOTE — Progress Notes (Signed)
Phone [phone] In person visit   Subjective:   Alejandro [name] is a 62 y.o. year old very pleasant male patient who presents for/with See problem oriented charting Chief Complaint  Patient presents with   Night Sweats    Pt c/o having night sweats that he noticed a few months ago and Is not sure if he suffers from hypoglycemia.   Past Medical History-  Patient Active Problem List   Diagnosis Date Noted   Psoriasis 02/22/2016    Priority: High   Severe motion sickness 02/22/2016    Priority: High   History of colon cancer 10/01/2007    Priority: High   Benign essential tremor 08/03/2020    Priority: Medium    Cyclical vomiting syndrome not associated with migraine 04/15/2020    Priority: Medium    Insomnia disorder, with other sleep disorder, recurrent 04/15/2020    Priority: Medium    Complex sleep apnea syndrome 12/19/2019    Priority: Medium    Syncope 06/14/2017    Priority: Medium    Groin rash 02/22/2016    Priority: Medium    Hyperhidrosis of palms and soles 02/22/2016    Priority: Medium    Hyperlipidemia 09/05/2013    Priority: Medium    GERD (gastroesophageal reflux disease) 09/05/2013    Priority: Medium    Sleep apnea 09/28/2011    Priority: Medium    Severe obstructive sleep apnea-hypopnea syndrome 04/15/2020    Priority: Low   Intolerance of continuous positive airway pressure (CPAP) ventilation 12/19/2019    Priority: Low   DDD (degenerative disc disease), lumbar 10/21/2019    Priority: Low   Prolonged P-R interval 07/25/2017    Priority: Low   Left hamstring muscle strain 12/08/2014    Priority: Low   Vestibular disequilibrium 04/15/2020    Medications- reviewed and updated Current Outpatient Medications  Medication Sig Dispense Refill   ALPRAZolam (XANAX) 0.25 MG tablet Take 1 tablet (0.25 mg total) by mouth daily as needed for anxiety (only to be used as needed for vestibular/ cyclic vomiting.). Prn for vestibular/ cyclic vomiting.  30 tablet 1   atorvastatin (LIPITOR) 40 MG tablet Take 1 tablet (40 mg total) by mouth daily. 90 tablet 3   betamethasone dipropionate 0.05 % lotion APPLY LOTION TOPICALLY ONCE DAILY TO SCALP AND EARS     Blood Glucose Monitoring Suppl DEVI 1 each by Does not apply route in the morning, at noon, and at bedtime. May substitute to any manufacturer covered by patient's insurance. 1 each 0   Calcipotriene 0.005 % solution APPLY  SOLUTION TOPICALLY TWICE DAILY 60 mL 0   clotrimazole-betamethasone (LOTRISONE) cream APPLY  CREAM TOPICALLY TWICE DAILY 30 g 0   fluticasone (CUTIVATE) 0.05 % cream APPLY  CREAM TOPICALLY TWICE DAILY 30 g 0   Glucose Blood (BLOOD GLUCOSE TEST STRIPS) STRP 1 each by In Vitro route in the morning, at noon, and at bedtime. May substitute to any manufacturer covered by patient's insurance. 100 strip 0   hydrocortisone 2.5 % ointment APPLY OINTMENT EXTERNALLY ONCE DAILY TO FACE AND GENITALS     Lancet Device MISC 1 each by Does not apply route in the morning, at noon, and at bedtime. May substitute to any manufacturer covered by patient's insurance. 1 each 0   Lancets Misc. MISC 1 each by Does not apply route in the morning, at noon, and at bedtime. May substitute to any manufacturer covered by patient's insurance. 100 each 0   omeprazole (PRILOSEC) 20 MG capsule  1 capsule 30 minutes before morning meal     ondansetron (ZOFRAN) 4 MG tablet Take 4 mg by mouth every 8 (eight) hours as needed for nausea or vomiting.     sertraline (ZOLOFT) 50 MG tablet TAKE 1 TABLET BY MOUTH AT BEDTIME 30 tablet 0   No current facility-administered medications for this visit.     Objective:  BP 100/60   Pulse (!) 58   Temp (!) 97 F (36.1 C)   Ht '5\' 5"'$  (1.651 m)   Wt 168 lb (76.2 kg)   SpO2 96%   BMI 27.96 kg/m  Gen: NAD, resting comfortably No cervical, axillary, groin lymphadenopathy CV: RRR no murmurs rubs or gallops-not bradycardic on my exam Lungs: CTAB no crackles, wheeze,  rhonchi Abdomen: soft/nontender/nondistended/normal bowel sounds. No rebound or guarding.  Ext: no edema Skin: warm, dry Neuro: grossly normal, moves all extremities    Assessment and Plan   # Night sweats S:Patient noted these about 2 months ago (after CPE)- almost feels like sympathy hot flashes. Typically occurs most nights . Does drench sheets and clothes.  No unintentional weight loss.  Has not measured any fevers- does not feel ill.  No new cough and no hemoptysis.  Only sexually active with wife-very low risk HIV. Mom has suffered from hypoglycemia- he feels almost sick in daytime if doesn't eat soon enough- some hot and clammy but not nearly as severe.  -Has had labs December 08, 2021-CBC, CMP, urinalysis, PSA largely reassuring at that time but symptoms not present at that time   -Does have history of colon cancer with last colonoscopy 08/06/2018-next colonoscopy will be with Dr. Ulla Gallo with [facility]. Has scheduled colonoscopy for march- just established last week A/P: Patient with new onset drenching night sweats in the last 2 months - Labs as below including evaluation for tuberculosis (he wanted to hold off on chest x-ray as well as HIV as monogamous) -We discussed medications could be playing a role--sertraline 50 mg through Dr. Brett Fairy- has some chance of causing this, omeprazole also possible.  We may need to tweak these -Encouraged to check blood pressure and blood sugar (sending glucometer) and temperature during these episodes -Check vitamin D since alkaline phosphatase was slightly high last visit - Check testosterone given reports of low libido though no substantial erectile issues -Scheduled for repeat colonoscopy with [facility]-would likely complete this before taking to aggressive of next steps and potential malignancy workup unless has elevated CRP -He declined blood cultures-he was less concerned about infectious cause at present  Recommended follow up: Return for as  needed for new, worsening, persistent symptoms. Future Appointments  Date Time Provider [place]  08/09/2022 10:00 AM Ward Givens, NP [facility] None    Lab/Order associations: fasting   ICD-10-CM   1. Night sweats  R61 CBC with Differential/Platelet    Comprehensive metabolic panel    TSH    QuantiFERON-TB Gold Plus    C-reactive protein    Urinalysis, Routine w reflex microscopic    2. Low libido  R68.82 Testosterone Total,Free,Bio, Males-([facility])      Meds ordered this encounter  Medications   Blood Glucose Monitoring Suppl DEVI    Sig: 1 each by Does not apply route in the morning, at noon, and at bedtime. May substitute to any manufacturer covered by patient's insurance.    Dispense:  1 each    Refill:  0   Glucose Blood (BLOOD GLUCOSE TEST STRIPS) STRP    Sig: 1 each by In  Vitro route in the morning, at noon, and at bedtime. May substitute to any manufacturer covered by patient's insurance.    Dispense:  100 strip    Refill:  0   Lancet Device MISC    Sig: 1 each by Does not apply route in the morning, at noon, and at bedtime. May substitute to any manufacturer covered by patient's insurance.    Dispense:  1 each    Refill:  0   Lancets Misc. MISC    Sig: 1 each by Does not apply route in the morning, at noon, and at bedtime. May substitute to any manufacturer covered by patient's insurance.    Dispense:  100 each    Refill:  0   Time Spent: 29 minutes of total time (8:31 AM- 9:00 AM) was spent on the date of the encounter performing the following actions: chart review prior to seeing the patient, obtaining history, performing a medically necessary exam, counseling on the workup of night sweats as well as indications for follow-up and potential treatment plan, placing orders, and documenting in our EHR.    Return precautions advised.  Garret Reddish, MD

## 2022-02-08 LAB — TESTOSTERONE TOTAL,FREE,BIO, MALES
Albumin: 4.5 g/dL (ref 3.6–5.1)
Sex Hormone Binding: 34 nmol/L (ref 22–77)
Testosterone, Bioavailable: 87.9 ng/dL — ABNORMAL LOW (ref 110.0–575.0)
Testosterone, Free: 42.7 pg/mL — ABNORMAL LOW (ref 46.0–224.0)
Testosterone: 341 ng/dL (ref 250–827)

## 2022-02-08 LAB — QUANTIFERON-TB GOLD PLUS
Mitogen-NIL: >10 IU/mL
NIL: 0.01 IU/mL
QuantiFERON-TB Gold Plus: NEGATIVE
TB1-NIL: 0.01 IU/mL
TB2-NIL: 0.02 IU/mL

## 2022-02-08 LAB — SPECIMEN COMPROMISED

## 2022-02-10 ENCOUNTER — Other Ambulatory Visit: Payer: Self-pay

## 2022-02-10 DIAGNOSIS — R7989 Other specified abnormal findings of blood chemistry: Secondary | ICD-10-CM

## 2022-02-17 ENCOUNTER — Ambulatory Visit: Payer: BC Managed Care – PPO | Admitting: Family Medicine

## 2022-02-22 ENCOUNTER — Other Ambulatory Visit: Payer: Self-pay | Admitting: Neurology

## 2022-03-14 ENCOUNTER — Other Ambulatory Visit: Payer: Self-pay | Admitting: Family Medicine

## 2022-03-21 DIAGNOSIS — Z85038 Personal history of other malignant neoplasm of large intestine: Secondary | ICD-10-CM | POA: Diagnosis not present

## 2022-03-21 DIAGNOSIS — Z08 Encounter for follow-up examination after completed treatment for malignant neoplasm: Secondary | ICD-10-CM | POA: Diagnosis not present

## 2022-03-21 DIAGNOSIS — D125 Benign neoplasm of sigmoid colon: Secondary | ICD-10-CM | POA: Diagnosis not present

## 2022-03-21 LAB — HM COLONOSCOPY

## 2022-05-19 DIAGNOSIS — L509 Urticaria, unspecified: Secondary | ICD-10-CM | POA: Diagnosis not present

## 2022-05-19 DIAGNOSIS — L400 Psoriasis vulgaris: Secondary | ICD-10-CM | POA: Diagnosis not present

## 2022-05-19 DIAGNOSIS — D2261 Melanocytic nevi of right upper limb, including shoulder: Secondary | ICD-10-CM | POA: Diagnosis not present

## 2022-05-19 DIAGNOSIS — D2372 Other benign neoplasm of skin of left lower limb, including hip: Secondary | ICD-10-CM | POA: Diagnosis not present

## 2022-05-26 DIAGNOSIS — S83242A Other tear of medial meniscus, current injury, left knee, initial encounter: Secondary | ICD-10-CM | POA: Diagnosis not present

## 2022-05-26 DIAGNOSIS — M1712 Unilateral primary osteoarthritis, left knee: Secondary | ICD-10-CM | POA: Diagnosis not present

## 2022-06-06 ENCOUNTER — Telehealth: Payer: Self-pay | Admitting: Family Medicine

## 2022-06-06 NOTE — Telephone Encounter (Signed)
Patient states finished vitamin D fill that was sent in following 2/5 OV. Pt is wanting to know if PCP wants him to continue taking the medication or stop it. States he finished bottle on 05/31/22.

## 2022-06-06 NOTE — Telephone Encounter (Signed)
Called and spoke with pt and informed him Dr. Pamala Hurry recommendations via last lab result in February to take 2000iu of vit D once he has completed the Rx.

## 2022-06-07 ENCOUNTER — Other Ambulatory Visit: Payer: Self-pay | Admitting: Family Medicine

## 2022-06-21 ENCOUNTER — Telehealth: Payer: Self-pay | Admitting: Family Medicine

## 2022-06-21 NOTE — Telephone Encounter (Signed)
Patient called to ask if it is safe to take dramamine daily. I advised pt to reach out to pharmacy just in case as he waits for PCP reply. Pt verbalized understanding.

## 2022-06-21 NOTE — Telephone Encounter (Signed)
I have never had someone use this long-term so difficult to fully assess but I would not recommend this-can always go back to neurology for their opinion

## 2022-06-21 NOTE — Telephone Encounter (Signed)
See below

## 2022-06-22 NOTE — Telephone Encounter (Signed)
Called and lm for pt tcb. 

## 2022-06-26 DIAGNOSIS — M9901 Segmental and somatic dysfunction of cervical region: Secondary | ICD-10-CM | POA: Diagnosis not present

## 2022-06-26 DIAGNOSIS — S335XXA Sprain of ligaments of lumbar spine, initial encounter: Secondary | ICD-10-CM | POA: Diagnosis not present

## 2022-06-26 DIAGNOSIS — M5411 Radiculopathy, occipito-atlanto-axial region: Secondary | ICD-10-CM | POA: Diagnosis not present

## 2022-06-27 DIAGNOSIS — M9901 Segmental and somatic dysfunction of cervical region: Secondary | ICD-10-CM | POA: Diagnosis not present

## 2022-06-27 DIAGNOSIS — M5411 Radiculopathy, occipito-atlanto-axial region: Secondary | ICD-10-CM | POA: Diagnosis not present

## 2022-06-27 DIAGNOSIS — S335XXA Sprain of ligaments of lumbar spine, initial encounter: Secondary | ICD-10-CM | POA: Diagnosis not present

## 2022-06-28 ENCOUNTER — Telehealth: Payer: Self-pay | Admitting: Family Medicine

## 2022-06-28 ENCOUNTER — Ambulatory Visit: Payer: BC Managed Care – PPO | Admitting: Family

## 2022-06-28 VITALS — BP 126/86 | HR 66 | Temp 97.3°F | Ht 65.0 in | Wt 161.2 lb

## 2022-06-28 DIAGNOSIS — M542 Cervicalgia: Secondary | ICD-10-CM

## 2022-06-28 DIAGNOSIS — R1115 Cyclical vomiting syndrome unrelated to migraine: Secondary | ICD-10-CM

## 2022-06-28 DIAGNOSIS — T753XXA Motion sickness, initial encounter: Secondary | ICD-10-CM | POA: Diagnosis not present

## 2022-06-28 DIAGNOSIS — H832X9 Labyrinthine dysfunction, unspecified ear: Secondary | ICD-10-CM | POA: Diagnosis not present

## 2022-06-28 NOTE — Telephone Encounter (Signed)
Patient called requesting Alejandro [name] call him in regards to the MRI order she placed this morning. Upon asking for clarification/detail, patient just stated he wanted her to change the order. Please Advise.

## 2022-06-28 NOTE — Progress Notes (Signed)
Patient ID: Alejandro [name], male    DOB: 01/27/1960, 62 y.o.   MRN: [id number]  Chief Complaint  Patient presents with   Motion sickness    Pt c/o motion sickness is whole life, would like a MRI. Currently does not take any medications    HPI:      Persistent dizziness:  reports hx of head trauma as a child, then started having migraines in his teens, seen by chiro and had an adjustment and no migraines since. Also fell in 2019 and landed on back of his head, CT scan at that time wnl. Seen chiro again recently and has some cervical stenosis. Pt reports dizziness, nausea, off balance.  Reports not drinking much water, caffeine only every other day. Reports having motion sickness (usually a passenger in car, or even bending over and standing quickly) first that brings it on, gets sweaty, nauseous, and vomiting sometimes, last episode on 6/8. Since then he has not felt good. Pt states he has seen specialists in past and has been given DX and offered PT services and meds, but nothing else can be done, pt reports due to ongoing sx affecting his quality of life,  he is wanting to be sure there is nothing else wrong causing his sx.      Assessment & Plan:  1. Vestibular disequilibrium, unspecified laterality- called radiology at [facility], spoke w/Dr Margo Aye who recommended the MRI w/IAC. Pt requested a copy of the order to take with him as he wants to use a different radiology office.  - MR BRAIN/IAC W WO CONTRAST; Future  2. Cyclical vomiting syndrome not associated with migraine  - MR BRAIN/IAC W WO CONTRAST; Future  3. Severe motion sickness, initial encounter   - MR BRAIN/IAC W WO CONTRAST; Future   Subjective:    Outpatient Medications Prior to Visit  Medication Sig Dispense Refill   ALPRAZolam (XANAX) 0.25 MG tablet Take 1 tablet (0.25 mg total) by mouth daily as needed for anxiety (only to be used as needed for vestibular/ cyclic vomiting.). Prn for vestibular/ cyclic vomiting.  30 tablet 1   atorvastatin (LIPITOR) 40 MG tablet Take 1 tablet by mouth once daily 90 tablet 0   betamethasone dipropionate 0.05 % lotion APPLY LOTION TOPICALLY ONCE DAILY TO SCALP AND EARS     Blood Glucose Monitoring Suppl DEVI 1 each by Does not apply route in the morning, at noon, and at bedtime. May substitute to any manufacturer covered by patient's insurance. 1 each 0   Calcipotriene 0.005 % solution APPLY  SOLUTION TOPICALLY TWICE DAILY 60 mL 0   clotrimazole-betamethasone (LOTRISONE) cream APPLY  CREAM TOPICALLY TWICE DAILY 30 g 0   fluticasone (CUTIVATE) 0.05 % cream APPLY  CREAM TOPICALLY TWICE DAILY 30 g 0   hydrocortisone 2.5 % ointment APPLY OINTMENT EXTERNALLY ONCE DAILY TO FACE AND GENITALS     omeprazole (PRILOSEC) 20 MG capsule 1 capsule 30 minutes before morning meal     ondansetron (ZOFRAN) 4 MG tablet Take 4 mg by mouth every 8 (eight) hours as needed for nausea or vomiting.     sertraline (ZOLOFT) 50 MG tablet TAKE 1 TABLET BY MOUTH AT BEDTIME 90 tablet 1   Vitamin D, Ergocalciferol, (DRISDOL) 1.25 MG (50000 UNIT) CAPS capsule Take 1 capsule (50,000 Units total) by mouth every 7 (seven) days. 13 capsule 1   No facility-administered medications prior to visit.   Past Medical History:  Diagnosis Date   Allergy    Central  sleep apnea    AHI of 44 exacerbated to 52 and failed CPAP   Colon cancer (HCC)    polpy removed from colon that was cancerous   Complex sleep apnea syndrome    baseline AHI of 44 on 04-21-11, changed to adapt SV     Complication of anesthesia    Patient has hard time waking up   GERD (gastroesophageal reflux disease)    HEMORRHOIDS-INTERNAL 10/01/2007   Qualifier: Diagnosis of  By: Russella Dar MD Marylu Lund    High cholesterol    PONV (postoperative nausea and vomiting)    Psoriasis    Right elbow pain 08/19/2015   Past Surgical History:  Procedure Laterality Date   COLONOSCOPY  2009   CANCEROUS POLYPS REMOVED   KNEE ARTHROSCOPY Left 1979    KNEE ARTHROSCOPY Right 08/22/2012   Procedure: RIGHT KNEE ARTHROSCOPY WITH DEBRIDEMENT ;  Surgeon: Senaida Lange, MD;  Location: [facility] OR;  Service: Orthopedics;  Laterality: Right;   WRIST FRACTURE SURGERY Left ~ 1995   Allergies  Allergen Reactions   Corticosteroids     Other reaction(s): Other (See Comments) Blurry vision- blisters behind retinas Blurry vision- blisters behind retinas   Cortisone    Depo-Medrol [Methylprednisolone Sodium Succ] Other (See Comments)    Blurry vision- blisters behind retinas   Other Other (See Comments)    Does/t likt narcotics and cannot take steroids  Blurry vision- blisters behind retinas    Codeine Other (See Comments)    REACTION: nausea, vomiting ABD PAIN REACTION: nausea, vomiting Other reaction(s): Other (See Comments), stomach cramps ABD PAIN REACTION: nausea, vomiting REACTION: nausea, vomiting ABD PAIN REACTION: nausea, vomiting    Prednisone     Other reaction(s): blisters on retina   Trazodone And Nefazodone     Motion sickness issues if didn't fall asleep by time kicked in       Objective:    Physical Exam Vitals and nursing note reviewed.  Constitutional:      General: He is not in acute distress.    Appearance: Normal appearance.  HENT:     Head: Normocephalic.  Cardiovascular:     Rate and Rhythm: Normal rate and regular rhythm.  Pulmonary:     Effort: Pulmonary effort is normal.     Breath sounds: Normal breath sounds.  Musculoskeletal:        General: Normal range of motion.     Cervical back: Normal range of motion.  Skin:    General: Skin is warm and dry.  Neurological:     Mental Status: He is alert and oriented to person, place, and time.  Psychiatric:        Mood and Affect: Mood normal.    Temp (!) 97.3 F (36.3 C) (Temporal)   Ht 5\' 5"  (1.651 m)   Wt 161 lb 4 oz (73.1 kg)   BMI 26.83 kg/m  Wt Readings from Last 3 Encounters:  06/28/22 161 lb 4 oz (73.1 kg)  02/06/22 168 lb (76.2 kg)  12/08/21  167 lb 9.6 oz (76 kg)       Dulce Sellar, NP

## 2022-06-28 NOTE — Telephone Encounter (Signed)
See below

## 2022-06-28 NOTE — Telephone Encounter (Signed)
Patient called back for an update. States he would also like an MRI of cervical neck w/o contrast. Patient would like to have imaging done at [facility] (information below). Patient informed me that [facility] would need the authorization #3 from insurance sent to them as well.    [facility]  [address] [place], [place] [address] Phone: [phone] Fax: [phone]

## 2022-06-29 NOTE — Telephone Encounter (Signed)
Can let pt know I have changed the order and entered [facility]. But I'm pretty sure this will not show his brain or inner ear, temporal region of his head where his vestibular symptoms are coming from. I'm guessing his chiropractor recommended this MRI?

## 2022-06-29 NOTE — Telephone Encounter (Signed)
Orders faxed to requested facility.

## 2022-06-29 NOTE — Telephone Encounter (Signed)
Per patient he received the message from [facility] prior to my call. No concerns

## 2022-06-29 NOTE — Addendum Note (Signed)
Addended byDulce Sellar on: 06/29/2022 09:45 AM   Modules accepted: Orders

## 2022-06-30 NOTE — Addendum Note (Signed)
Addended by: Jatavis Malek O on: 06/30/2022 12:40 PM   Modules accepted: Orders  

## 2022-06-30 NOTE — Addendum Note (Signed)
Addended by: Shelva Majestic on: 06/30/2022 12:40 PM   Modules accepted: Orders

## 2022-07-17 DIAGNOSIS — J329 Chronic sinusitis, unspecified: Secondary | ICD-10-CM | POA: Diagnosis not present

## 2022-07-17 DIAGNOSIS — R1115 Cyclical vomiting syndrome unrelated to migraine: Secondary | ICD-10-CM | POA: Diagnosis not present

## 2022-07-17 DIAGNOSIS — M50221 Other cervical disc displacement at C4-C5 level: Secondary | ICD-10-CM | POA: Diagnosis not present

## 2022-07-17 DIAGNOSIS — M5021 Other cervical disc displacement,  high cervical region: Secondary | ICD-10-CM | POA: Diagnosis not present

## 2022-07-17 DIAGNOSIS — C189 Malignant neoplasm of colon, unspecified: Secondary | ICD-10-CM | POA: Diagnosis not present

## 2022-07-17 DIAGNOSIS — M50222 Other cervical disc displacement at C5-C6 level: Secondary | ICD-10-CM | POA: Diagnosis not present

## 2022-07-17 DIAGNOSIS — R42 Dizziness and giddiness: Secondary | ICD-10-CM | POA: Diagnosis not present

## 2022-08-08 ENCOUNTER — Encounter: Payer: Self-pay | Admitting: *Deleted

## 2022-08-09 ENCOUNTER — Encounter: Payer: Self-pay | Admitting: Adult Health

## 2022-08-09 ENCOUNTER — Ambulatory Visit: Payer: BC Managed Care – PPO | Admitting: Adult Health

## 2022-08-09 VITALS — BP 122/78 | HR 57 | Ht 65.0 in | Wt 166.0 lb

## 2022-08-09 DIAGNOSIS — G4733 Obstructive sleep apnea (adult) (pediatric): Secondary | ICD-10-CM

## 2022-08-09 NOTE — Progress Notes (Addendum)
Order for change in DME and mask refit sent to [facility]. Receipt of order confirmed.

## 2022-08-09 NOTE — Progress Notes (Signed)
PATIENT: Alejandro [name] DOB: March 19, 1960  REASON FOR VISIT: follow up HISTORY FROM: patient PRIMARY NEUROLOGIST: Dr. Vickey Huger  Chief Complaint  Patient presents with   Sleep Apnea    Rm 18 alone Pt is well, reports he is having trouble with keeping CPAP mask on. He gets about 7-8 hrs of sleep but doesn't feel like he goes into REM sleep, he doesn't feel rested.      HISTORY OF PRESENT ILLNESS: Today 08/09/22:  Alejandro [name] is a 62 y.o. male with a history of OSA on CPAP and Insomnia. Returns today for follow-up.  His download indicates that he uses his machine 7 out of 30 days for compliance of 23%.  He uses his machine greater than 4 hours only 5 days for compliance of 60%.  He has residual AHI is 4.6.  He states that he has struggled using the CPAP.  States that he moves around a lot at night and the mask starts to leak.  Sometimes he takes the mask off unknowingly during the night.  He has not changed out his mask.  Currently using the DreamWear mask.  He states that he does not have a problem falling asleep typically but when he wakes up during the night he is unable to go back to sleep.  This certainly could be due to untreated sleep apnea?  He does report some nasal congestion which makes it hard to use the mask.  Reports that he has tried Flonase in the past.  He returns today for an evaluation.    REVIEW OF SYSTEMS: Out of a complete 14 system review of symptoms, the patient complains only of the following symptoms, and all other reviewed systems are negative.  ESS 12  ALLERGIES: Allergies  Allergen Reactions   Corticosteroids     Other reaction(s): Other (See Comments) Blurry vision- blisters behind retinas Blurry vision- blisters behind retinas   Cortisone    Depo-Medrol [Methylprednisolone Sodium Succ] Other (See Comments)    Blurry vision- blisters behind retinas   Other Other (See Comments)    Does/t likt narcotics and cannot take steroids  Blurry  vision- blisters behind retinas    Codeine Other (See Comments)    REACTION: nausea, vomiting ABD PAIN REACTION: nausea, vomiting Other reaction(s): Other (See Comments), stomach cramps ABD PAIN REACTION: nausea, vomiting REACTION: nausea, vomiting ABD PAIN REACTION: nausea, vomiting    Prednisone     Other reaction(s): blisters on retina   Trazodone And Nefazodone     Motion sickness issues if didn't fall asleep by time kicked in     HOME MEDICATIONS: Outpatient Medications Prior to Visit  Medication Sig Dispense Refill   ALPRAZolam (XANAX) 0.25 MG tablet Take 1 tablet (0.25 mg total) by mouth daily as needed for anxiety (only to be used as needed for vestibular/ cyclic vomiting.). Prn for vestibular/ cyclic vomiting. 30 tablet 1   atorvastatin (LIPITOR) 40 MG tablet Take 1 tablet by mouth once daily 90 tablet 0   betamethasone dipropionate 0.05 % lotion APPLY LOTION TOPICALLY ONCE DAILY TO SCALP AND EARS     Calcipotriene 0.005 % solution APPLY  SOLUTION TOPICALLY TWICE DAILY 60 mL 0   clotrimazole-betamethasone (LOTRISONE) cream APPLY  CREAM TOPICALLY TWICE DAILY 30 g 0   fluticasone (CUTIVATE) 0.05 % cream APPLY  CREAM TOPICALLY TWICE DAILY 30 g 0   hydrocortisone 2.5 % ointment APPLY OINTMENT EXTERNALLY ONCE DAILY TO FACE AND GENITALS     omeprazole (PRILOSEC) 20 MG  capsule 1 capsule 30 minutes before morning meal     ondansetron (ZOFRAN) 4 MG tablet Take 4 mg by mouth every 8 (eight) hours as needed for nausea or vomiting.     sertraline (ZOLOFT) 50 MG tablet TAKE 1 TABLET BY MOUTH AT BEDTIME 90 tablet 1   Vitamin D, Ergocalciferol, (DRISDOL) 1.25 MG (50000 UNIT) CAPS capsule Take 1 capsule (50,000 Units total) by mouth every 7 (seven) days. 13 capsule 1   Blood Glucose Monitoring Suppl DEVI 1 each by Does not apply route in the morning, at noon, and at bedtime. May substitute to any manufacturer covered by patient's insurance. (Patient not taking: Reported on 08/09/2022) 1 each  0   No facility-administered medications prior to visit.    PAST MEDICAL HISTORY: Past Medical History:  Diagnosis Date   Allergy    Central sleep apnea    AHI of 44 exacerbated to 52 and failed CPAP   Colon cancer (HCC)    polpy removed from colon that was cancerous   Complex sleep apnea syndrome    baseline AHI of 44 on 04-21-11, changed to adapt SV     Complication of anesthesia    Patient has hard time waking up   GERD (gastroesophageal reflux disease)    HEMORRHOIDS-INTERNAL 10/01/2007   Qualifier: Diagnosis of  By: Russella Dar MD Marylu Lund    High cholesterol    PONV (postoperative nausea and vomiting)    Psoriasis    Right elbow pain 08/19/2015    PAST SURGICAL HISTORY: Past Surgical History:  Procedure Laterality Date   COLONOSCOPY  2009   CANCEROUS POLYPS REMOVED   KNEE ARTHROSCOPY Left 1979   KNEE ARTHROSCOPY Right 08/22/2012   Procedure: RIGHT KNEE ARTHROSCOPY WITH DEBRIDEMENT ;  Surgeon: Senaida Lange, MD;  Location: [facility] OR;  Service: Orthopedics;  Laterality: Right;   WRIST FRACTURE SURGERY Left ~ 1995    FAMILY HISTORY: Family History  Problem Relation Age of Onset   Sleep disorder Father    Hyperlipidemia Father    Sleep disorder Paternal Grandfather    Heart disease Paternal Grandfather    Cancer Paternal Grandmother        breast   Breast cancer Mother    Alcohol abuse Mother    Eating disorder Mother    Diabetes Maternal Grandmother    Heart disease Maternal Grandfather        late life   Hyperlipidemia Maternal Grandfather    Stroke Maternal Grandfather        late life    SOCIAL HISTORY: Social History   Socioeconomic History   Marital status: Married    Spouse name: Not on file   Number of children: 2   Years of education: BS   Highest education level: Bachelor's degree (e.g., BA, AB, BS)  Occupational History    Comment: [employer]  Tobacco Use   Smoking status: Never   Smokeless tobacco: Never  Vaping  Use   Vaping status: Never Used  Substance and Sexual Activity   Alcohol use: Yes    Alcohol/week: 3.0 - 4.0 standard drinks of alcohol    Types: 3 - 4 Cans of beer per week    Comment: 3 - 4 beers during some weeks    Drug use: No   Sexual activity: Yes  Other Topics Concern   Not on file  Social History Narrative   Married. 2 grown daughters-  one in [place] ([employer] grad) and one in [place] (23)  in 2022.       BA at [employer]- econ major. 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)      HObbies: sports activites- golf, tennis, frisbee golf    Social Determinants of Health   Financial Resource Strain: Low Risk  (06/27/2022)   Overall Financial Resource Strain (CARDIA)    Difficulty of Paying Living Expenses: Not hard at all  Food Insecurity: No Food Insecurity (06/27/2022)   Hunger Vital Sign    Worried About Running Out of Food in the Last Year: Never true    Ran Out of Food in the Last Year: Never true  Transportation Needs: No Transportation Needs (06/27/2022)   PRAPARE - Administrator, Civil Service (Medical): No    Lack of Transportation (Non-Medical): No  Physical Activity: Not on file  Stress: Not on file  Social Connections: Moderately Integrated (06/27/2022)   Social Connection and Isolation Panel [NHANES]    Frequency of Communication with Friends and Family: More than three times a week    Frequency of Social Gatherings with Friends and Family: More than three times a week    Attends Religious Services: 1 to 4 times per year    Active Member of [employer] or Organizations: No    Attends Engineer, structural: Not on file    Marital Status: Married  Catering manager Violence: Not on file      PHYSICAL EXAM  Vitals:   08/09/22 1003  BP: 122/78  Pulse: (!) 57  Weight: 166 lb (75.3 kg)  Height: 5\' 5"  (1.651 m)   Body mass index is 27.62 kg/m.  Generalized: Well developed, in no acute distress   Chest: Lungs clear to auscultation bilaterally  Neurological examination  Mentation: Alert oriented to time, place, history taking. Follows all commands speech and language fluent Cranial nerve II-XII: Facial symmetry noted  DIAGNOSTIC DATA (LABS, IMAGING, TESTING) - I reviewed patient records, labs, notes, testing and imaging myself where available.  Lab Results  Component Value Date   WBC 4.2 02/06/2022   HGB 14.7 02/06/2022   HCT 43.2 02/06/2022   MCV 86.2 02/06/2022   PLT 248.0 02/06/2022      Component Value Date/Time   NA 141 02/06/2022 0911   K 4.3 02/06/2022 0911   CL 106 02/06/2022 0911   CO2 28 02/06/2022 0911   GLUCOSE 102 (H) 02/06/2022 0911   BUN 18 02/06/2022 0911   CREATININE 0.79 02/06/2022 0911   CREATININE 0.82 10/21/2019 1026   CALCIUM 9.6 02/06/2022 0911   PROT 6.8 02/06/2022 0911   ALBUMIN 4.6 02/06/2022 0911   AST 19 02/06/2022 0911   ALT 18 02/06/2022 0911   ALKPHOS 145 (H) 02/06/2022 0911   BILITOT 0.4 02/06/2022 0911   GFRNONAA 98 10/21/2019 1026   GFRAA 114 10/21/2019 1026   Lab Results  Component Value Date   CHOL 202 (H) 12/08/2021   HDL 57.30 12/08/2021   LDLCALC 123 (H) 12/08/2021   TRIG 107.0 12/08/2021   CHOLHDL 4 12/08/2021   No results found for: "HGBA1C" Lab Results  Component Value Date   VITAMINB12 524 12/07/2020   Lab Results  Component Value Date   TSH 1.21 02/06/2022      ASSESSMENT AND PLAN 62 y.o. year old male  has a past medical history of Allergy, Central sleep apnea, Colon cancer (HCC), Complex sleep apnea syndrome, Complication of anesthesia, GERD (gastroesophageal reflux disease), HEMORRHOIDS-INTERNAL (10/01/2007), High cholesterol, PONV (postoperative nausea  and vomiting), Psoriasis, and Right elbow pain (08/19/2015). here with:  OSA on CPAP  -Noncompliant with CPAP therapy -Advised patient that he should try to be consistent with CPAP usage.  Encouraged him that the more consistent and routine he is with  his CPAP usage the easier it will be to use and see the benefit.  Patient voiced understanding. -He would like to change his DME company.  We will send to [facility]. Will also send in a mask refitting. - F/U in 6 months or sooner if needed n.  Butch Penny, MSN, NP-C 08/09/2022, 10:18 AM [facility] [address], [address] [place], [place] [address] [phone]

## 2022-08-15 DIAGNOSIS — M9901 Segmental and somatic dysfunction of cervical region: Secondary | ICD-10-CM | POA: Diagnosis not present

## 2022-08-15 DIAGNOSIS — S335XXA Sprain of ligaments of lumbar spine, initial encounter: Secondary | ICD-10-CM | POA: Diagnosis not present

## 2022-08-15 DIAGNOSIS — M5411 Radiculopathy, occipito-atlanto-axial region: Secondary | ICD-10-CM | POA: Diagnosis not present

## 2022-08-16 ENCOUNTER — Telehealth: Payer: Self-pay | Admitting: Family Medicine

## 2022-08-16 DIAGNOSIS — M9901 Segmental and somatic dysfunction of cervical region: Secondary | ICD-10-CM | POA: Diagnosis not present

## 2022-08-16 DIAGNOSIS — M5411 Radiculopathy, occipito-atlanto-axial region: Secondary | ICD-10-CM | POA: Diagnosis not present

## 2022-08-16 DIAGNOSIS — S335XXA Sprain of ligaments of lumbar spine, initial encounter: Secondary | ICD-10-CM | POA: Diagnosis not present

## 2022-08-16 NOTE — Telephone Encounter (Signed)
Patient called wanting to know why he was referred to [facility] urology for low testosterone. State he thought his testosterone was normal.

## 2022-08-17 NOTE — Telephone Encounter (Signed)
Called and lm on pt vm in reference to the referral and to check back his lab results/comments from 02/06/22 with Dr. Erasmo Leventhal explanation.

## 2022-08-21 DIAGNOSIS — S335XXA Sprain of ligaments of lumbar spine, initial encounter: Secondary | ICD-10-CM | POA: Diagnosis not present

## 2022-08-21 DIAGNOSIS — M9901 Segmental and somatic dysfunction of cervical region: Secondary | ICD-10-CM | POA: Diagnosis not present

## 2022-08-21 DIAGNOSIS — M5411 Radiculopathy, occipito-atlanto-axial region: Secondary | ICD-10-CM | POA: Diagnosis not present

## 2022-08-23 DIAGNOSIS — S335XXA Sprain of ligaments of lumbar spine, initial encounter: Secondary | ICD-10-CM | POA: Diagnosis not present

## 2022-08-23 DIAGNOSIS — M5411 Radiculopathy, occipito-atlanto-axial region: Secondary | ICD-10-CM | POA: Diagnosis not present

## 2022-08-23 DIAGNOSIS — M9901 Segmental and somatic dysfunction of cervical region: Secondary | ICD-10-CM | POA: Diagnosis not present

## 2022-08-24 DIAGNOSIS — G4733 Obstructive sleep apnea (adult) (pediatric): Secondary | ICD-10-CM | POA: Diagnosis not present

## 2022-08-28 DIAGNOSIS — R350 Frequency of micturition: Secondary | ICD-10-CM | POA: Diagnosis not present

## 2022-08-28 DIAGNOSIS — E349 Endocrine disorder, unspecified: Secondary | ICD-10-CM | POA: Diagnosis not present

## 2022-08-28 DIAGNOSIS — N401 Enlarged prostate with lower urinary tract symptoms: Secondary | ICD-10-CM | POA: Diagnosis not present

## 2022-08-28 DIAGNOSIS — R3915 Urgency of urination: Secondary | ICD-10-CM | POA: Diagnosis not present

## 2022-08-29 DIAGNOSIS — M5411 Radiculopathy, occipito-atlanto-axial region: Secondary | ICD-10-CM | POA: Diagnosis not present

## 2022-08-29 DIAGNOSIS — M9901 Segmental and somatic dysfunction of cervical region: Secondary | ICD-10-CM | POA: Diagnosis not present

## 2022-08-29 DIAGNOSIS — S335XXA Sprain of ligaments of lumbar spine, initial encounter: Secondary | ICD-10-CM | POA: Diagnosis not present

## 2022-08-31 DIAGNOSIS — M9901 Segmental and somatic dysfunction of cervical region: Secondary | ICD-10-CM | POA: Diagnosis not present

## 2022-08-31 DIAGNOSIS — M5411 Radiculopathy, occipito-atlanto-axial region: Secondary | ICD-10-CM | POA: Diagnosis not present

## 2022-08-31 DIAGNOSIS — S335XXA Sprain of ligaments of lumbar spine, initial encounter: Secondary | ICD-10-CM | POA: Diagnosis not present

## 2022-09-05 ENCOUNTER — Other Ambulatory Visit: Payer: Self-pay | Admitting: Family Medicine

## 2022-09-11 DIAGNOSIS — M9901 Segmental and somatic dysfunction of cervical region: Secondary | ICD-10-CM | POA: Diagnosis not present

## 2022-09-11 DIAGNOSIS — M5411 Radiculopathy, occipito-atlanto-axial region: Secondary | ICD-10-CM | POA: Diagnosis not present

## 2022-09-11 DIAGNOSIS — S335XXA Sprain of ligaments of lumbar spine, initial encounter: Secondary | ICD-10-CM | POA: Diagnosis not present

## 2022-09-14 DIAGNOSIS — M5411 Radiculopathy, occipito-atlanto-axial region: Secondary | ICD-10-CM | POA: Diagnosis not present

## 2022-09-14 DIAGNOSIS — M9901 Segmental and somatic dysfunction of cervical region: Secondary | ICD-10-CM | POA: Diagnosis not present

## 2022-09-14 DIAGNOSIS — S335XXA Sprain of ligaments of lumbar spine, initial encounter: Secondary | ICD-10-CM | POA: Diagnosis not present

## 2022-09-19 DIAGNOSIS — M9901 Segmental and somatic dysfunction of cervical region: Secondary | ICD-10-CM | POA: Diagnosis not present

## 2022-09-19 DIAGNOSIS — M5411 Radiculopathy, occipito-atlanto-axial region: Secondary | ICD-10-CM | POA: Diagnosis not present

## 2022-09-19 DIAGNOSIS — S335XXA Sprain of ligaments of lumbar spine, initial encounter: Secondary | ICD-10-CM | POA: Diagnosis not present

## 2022-09-21 DIAGNOSIS — M9901 Segmental and somatic dysfunction of cervical region: Secondary | ICD-10-CM | POA: Diagnosis not present

## 2022-09-21 DIAGNOSIS — M5411 Radiculopathy, occipito-atlanto-axial region: Secondary | ICD-10-CM | POA: Diagnosis not present

## 2022-09-21 DIAGNOSIS — S335XXA Sprain of ligaments of lumbar spine, initial encounter: Secondary | ICD-10-CM | POA: Diagnosis not present

## 2022-09-26 DIAGNOSIS — M9901 Segmental and somatic dysfunction of cervical region: Secondary | ICD-10-CM | POA: Diagnosis not present

## 2022-09-26 DIAGNOSIS — M5411 Radiculopathy, occipito-atlanto-axial region: Secondary | ICD-10-CM | POA: Diagnosis not present

## 2022-09-26 DIAGNOSIS — S335XXA Sprain of ligaments of lumbar spine, initial encounter: Secondary | ICD-10-CM | POA: Diagnosis not present

## 2022-10-10 DIAGNOSIS — M5411 Radiculopathy, occipito-atlanto-axial region: Secondary | ICD-10-CM | POA: Diagnosis not present

## 2022-10-10 DIAGNOSIS — S335XXA Sprain of ligaments of lumbar spine, initial encounter: Secondary | ICD-10-CM | POA: Diagnosis not present

## 2022-10-10 DIAGNOSIS — M9901 Segmental and somatic dysfunction of cervical region: Secondary | ICD-10-CM | POA: Diagnosis not present

## 2022-10-24 DIAGNOSIS — M5411 Radiculopathy, occipito-atlanto-axial region: Secondary | ICD-10-CM | POA: Diagnosis not present

## 2022-10-24 DIAGNOSIS — M9901 Segmental and somatic dysfunction of cervical region: Secondary | ICD-10-CM | POA: Diagnosis not present

## 2022-10-24 DIAGNOSIS — S335XXA Sprain of ligaments of lumbar spine, initial encounter: Secondary | ICD-10-CM | POA: Diagnosis not present

## 2022-11-07 DIAGNOSIS — M5411 Radiculopathy, occipito-atlanto-axial region: Secondary | ICD-10-CM | POA: Diagnosis not present

## 2022-11-07 DIAGNOSIS — M9901 Segmental and somatic dysfunction of cervical region: Secondary | ICD-10-CM | POA: Diagnosis not present

## 2022-11-07 DIAGNOSIS — S335XXA Sprain of ligaments of lumbar spine, initial encounter: Secondary | ICD-10-CM | POA: Diagnosis not present

## 2022-11-15 DIAGNOSIS — S335XXA Sprain of ligaments of lumbar spine, initial encounter: Secondary | ICD-10-CM | POA: Diagnosis not present

## 2022-11-15 DIAGNOSIS — M5411 Radiculopathy, occipito-atlanto-axial region: Secondary | ICD-10-CM | POA: Diagnosis not present

## 2022-11-15 DIAGNOSIS — M9901 Segmental and somatic dysfunction of cervical region: Secondary | ICD-10-CM | POA: Diagnosis not present

## 2022-11-27 ENCOUNTER — Encounter: Payer: Self-pay | Admitting: Family Medicine

## 2022-11-27 ENCOUNTER — Ambulatory Visit: Payer: BC Managed Care – PPO | Admitting: Family Medicine

## 2022-11-27 VITALS — BP 131/70 | HR 68 | Temp 98.2°F | Ht 65.0 in | Wt 165.4 lb

## 2022-11-27 DIAGNOSIS — M9901 Segmental and somatic dysfunction of cervical region: Secondary | ICD-10-CM | POA: Diagnosis not present

## 2022-11-27 DIAGNOSIS — S335XXA Sprain of ligaments of lumbar spine, initial encounter: Secondary | ICD-10-CM | POA: Diagnosis not present

## 2022-11-27 DIAGNOSIS — H1189 Other specified disorders of conjunctiva: Secondary | ICD-10-CM | POA: Diagnosis not present

## 2022-11-27 DIAGNOSIS — M5411 Radiculopathy, occipito-atlanto-axial region: Secondary | ICD-10-CM | POA: Diagnosis not present

## 2022-11-27 MED ORDER — POLYMYXIN B-TRIMETHOPRIM 10000-0.1 UNIT/ML-% OP SOLN
2.0000 [drp] | OPHTHALMIC | 0 refills | Status: DC
Start: 2022-11-27 — End: 2023-07-05

## 2022-11-27 NOTE — Progress Notes (Signed)
   Alejandro [name] is a 62 y.o. male who presents today for an office visit.  Assessment/Plan:  Conjunctival Irritation No red flags signs or symptoms.  May have a very mild allergic conjunctivitis.  No signs of bacterial infection.  History and exam not consistent with viral infection.  Recommended trial of over-the-counter Pataday drops.  Due to upcoming long holiday weekend, will also send in a pocket prescription for Polytrim drops with strict instruction to not start unless he develops worsening discharge or redness.  We discussed reasons to return to care.  Follow-up as needed.    Subjective:  HPI:  Patient here with right eye irritation.  Symptoms started last night.  Located Microzide.  He is having a little bit more watery discharge from the area.  No pain.  No vision changes.  No fevers or chills.  No recent illnesses.  Visual acuity has been at baseline.  No headache.  No foreign body sensation.  No pain with eye movement.  No photophobia.  He will be traveling for Thanksgiving and he will be around immunocompromise individuals.  He would like to make sure that he is not contagious.        Objective:  Physical Exam: BP 131/70   Pulse 68   Temp 98.2 F (36.8 C) (Temporal)   Ht 5\' 5"  (1.651 m)   Wt 165 lb 6.4 oz (75 kg)   SpO2 97%   BMI 27.52 kg/m   Gen: No acute distress, resting comfortably HEENT: Slight conjunctival erythema noted on lateral aspect of conjunctive of right eye.  Extraocular eye movements intact bilaterally without pain.  Visual acuity grossly intact.  No purulent drainage present. CV: Regular rate and rhythm with no murmurs appreciated Pulm: Normal work of breathing, clear to auscultation bilaterally with no crackles, wheezes, or rhonchi Neuro: Grossly normal, moves all extremities Psych: Normal affect and thought content      Brenda Samano M. Jimmey Ralph, MD 11/27/2022 2:48 PM

## 2022-11-27 NOTE — Patient Instructions (Signed)
It was very nice to see you today!  You did not have any signs of infection in your eye.  You probably have slight irritation.  Please try the Pataday drops to help with your symptoms.  Start the antibacterial Polytrim drops if you notice any purulent discharge, worsening pain, or worsening symptoms.   Return if symptoms worsen or fail to improve.   Take care, Dr Jimmey Ralph  PLEASE NOTE:  If you had any lab tests, please let us know if you have not heard back within a few days. You may see your results on mychart before we have a chance to review them but we will give you a call once they are reviewed by [place].   If we ordered any referrals today, please let us know if you have not heard from their office within the next week.   If you had any urgent prescriptions sent in today, please check with the pharmacy within an hour of our visit to make sure the prescription was transmitted appropriately.   Please try these tips to maintain a healthy lifestyle:  Eat at least 3 REAL meals and 1-2 snacks per day.  Aim for no more than 5 hours between eating.  If you eat breakfast, please do so within one hour of getting up.   Each meal should contain half fruits/vegetables, one quarter protein, and one quarter carbs (no bigger than a computer mouse)  Cut down on sweet beverages. This includes juice, soda, and sweet tea.   Drink at least 1 glass of water with each meal and aim for at least 8 glasses per day  Exercise at least 150 minutes every week.

## 2022-12-05 ENCOUNTER — Other Ambulatory Visit: Payer: Self-pay | Admitting: Family Medicine

## 2022-12-14 DIAGNOSIS — S335XXA Sprain of ligaments of lumbar spine, initial encounter: Secondary | ICD-10-CM | POA: Diagnosis not present

## 2022-12-14 DIAGNOSIS — M9901 Segmental and somatic dysfunction of cervical region: Secondary | ICD-10-CM | POA: Diagnosis not present

## 2022-12-14 DIAGNOSIS — M5411 Radiculopathy, occipito-atlanto-axial region: Secondary | ICD-10-CM | POA: Diagnosis not present

## 2023-01-02 ENCOUNTER — Other Ambulatory Visit: Payer: Self-pay | Admitting: Neurology

## 2023-01-02 NOTE — Telephone Encounter (Signed)
Rx refilled.

## 2023-01-24 DIAGNOSIS — M5411 Radiculopathy, occipito-atlanto-axial region: Secondary | ICD-10-CM | POA: Diagnosis not present

## 2023-01-24 DIAGNOSIS — S335XXA Sprain of ligaments of lumbar spine, initial encounter: Secondary | ICD-10-CM | POA: Diagnosis not present

## 2023-01-24 DIAGNOSIS — M9901 Segmental and somatic dysfunction of cervical region: Secondary | ICD-10-CM | POA: Diagnosis not present

## 2023-02-07 DIAGNOSIS — M9901 Segmental and somatic dysfunction of cervical region: Secondary | ICD-10-CM | POA: Diagnosis not present

## 2023-02-07 DIAGNOSIS — M5411 Radiculopathy, occipito-atlanto-axial region: Secondary | ICD-10-CM | POA: Diagnosis not present

## 2023-02-07 DIAGNOSIS — S335XXA Sprain of ligaments of lumbar spine, initial encounter: Secondary | ICD-10-CM | POA: Diagnosis not present

## 2023-02-13 ENCOUNTER — Other Ambulatory Visit: Payer: Self-pay | Admitting: Family Medicine

## 2023-02-21 ENCOUNTER — Telehealth (INDEPENDENT_AMBULATORY_CARE_PROVIDER_SITE_OTHER): Payer: Self-pay | Admitting: Adult Health

## 2023-02-21 DIAGNOSIS — M5411 Radiculopathy, occipito-atlanto-axial region: Secondary | ICD-10-CM | POA: Diagnosis not present

## 2023-02-21 DIAGNOSIS — G4733 Obstructive sleep apnea (adult) (pediatric): Secondary | ICD-10-CM

## 2023-02-21 DIAGNOSIS — M9901 Segmental and somatic dysfunction of cervical region: Secondary | ICD-10-CM | POA: Diagnosis not present

## 2023-02-21 DIAGNOSIS — S335XXA Sprain of ligaments of lumbar spine, initial encounter: Secondary | ICD-10-CM | POA: Diagnosis not present

## 2023-02-21 NOTE — Progress Notes (Signed)
Pt's cpap machine requires card download. Patient has rescheduled for 03/13/23 9:00 in-office visit. He wants to discuss alternative treatments for OSA. He couldn't tolerate the machine even with the new mask after his last appointment. He stopped using it a few months ago. Patient is interested in [employer].   No charge for today.

## 2023-02-21 NOTE — Telephone Encounter (Signed)
Pt's cpap DL is via card. I tried to call the pt. I LVM (ok per DPR) advising him that we need his CPAP data for his VV. If he is able to receive this message in time to go by [facility] and get DL before [id number] he is welcome to do that and call us. Alternatively we can reschedule him for next Wed 2/26 at 930 am.

## 2023-03-08 DIAGNOSIS — M9901 Segmental and somatic dysfunction of cervical region: Secondary | ICD-10-CM | POA: Diagnosis not present

## 2023-03-08 DIAGNOSIS — S335XXA Sprain of ligaments of lumbar spine, initial encounter: Secondary | ICD-10-CM | POA: Diagnosis not present

## 2023-03-08 DIAGNOSIS — M5411 Radiculopathy, occipito-atlanto-axial region: Secondary | ICD-10-CM | POA: Diagnosis not present

## 2023-03-11 ENCOUNTER — Other Ambulatory Visit: Payer: Self-pay | Admitting: Family Medicine

## 2023-03-11 NOTE — Progress Notes (Unsigned)
 PATIENT: Alejandro [name] DOB: [birth year]  REASON FOR VISIT: follow up HISTORY FROM: patient PRIMARY NEUROLOGIST: Dr. Vickey Huger  Chief Complaint  Patient presents with   Follow-up    Patient in room #20 and alone. Patient state he isn't able to use his CPAP machine due to his mask. ESS-7      HISTORY OF PRESENT ILLNESS: Today  Alejandro [name] is a 63 y.o. male with a history of OSA on CPAP. Returns today for follow-up.  He has not been using his CPAP November 2024.  He states that he has been struggling to get used to it.  States that he sleeps better without it.  Reports that a lot of times he struggles to fall asleep.  He has tried different mask in the past.  He is asking about inspire device.  HISTORY 08/09/22:   Alejandro [name] is a 63 y.o. male with a history of OSA on CPAP and Insomnia. Returns today for follow-up.  His download indicates that he uses his machine 7 out of 30 days for compliance of 23%.  He uses his machine greater than 4 hours only 5 days for compliance of 60%.  He has residual AHI is 4.6.  He states that he has struggled using the CPAP.  States that he moves around a lot at night and the mask starts to leak.  Sometimes he takes the mask off unknowingly during the night.  He has not changed out his mask.  Currently using the DreamWear mask.  He states that he does not have a problem falling asleep typically but when he wakes up during the night he is unable to go back to sleep.  This certainly could be due to untreated sleep apnea?  He does report some nasal congestion which makes it hard to use the mask.  Reports that he has tried Flonase in the past.  He returns today for an evaluation.  REVIEW OF SYSTEMS: Out of a complete 14 system review of symptoms, the patient complains only of the following symptoms, and all other reviewed systems are negative.  FSS 15  ESS 7  ALLERGIES: Allergies  Allergen Reactions   Corticosteroids     Other  reaction(s): Other (See Comments) Blurry vision- blisters behind retinas Blurry vision- blisters behind retinas   Cortisone    Depo-Medrol [Methylprednisolone Sodium Succ] Other (See Comments)    Blurry vision- blisters behind retinas   Other Other (See Comments)    Does/t likt narcotics and cannot take steroids  Blurry vision- blisters behind retinas    Codeine Other (See Comments)    REACTION: nausea, vomiting ABD PAIN REACTION: nausea, vomiting Other reaction(s): Other (See Comments), stomach cramps ABD PAIN REACTION: nausea, vomiting REACTION: nausea, vomiting ABD PAIN REACTION: nausea, vomiting    Prednisone     Other reaction(s): blisters on retina   Trazodone And Nefazodone     Motion sickness issues if didn't fall asleep by time kicked in     HOME MEDICATIONS: Outpatient Medications Prior to Visit  Medication Sig Dispense Refill   ALPRAZolam (XANAX) 0.25 MG tablet Take 1 tablet (0.25 mg total) by mouth daily as needed for anxiety (only to be used as needed for vestibular/ cyclic vomiting.). Prn for vestibular/ cyclic vomiting. 30 tablet 1   atorvastatin (LIPITOR) 40 MG tablet Take 1 tablet by mouth once daily 90 tablet 0   betamethasone dipropionate 0.05 % lotion APPLY LOTION TOPICALLY ONCE DAILY TO SCALP AND EARS  Calcipotriene 0.005 % solution APPLY  SOLUTION TOPICALLY TWICE DAILY 60 mL 0   clotrimazole-betamethasone (LOTRISONE) cream APPLY  CREAM TOPICALLY TWICE DAILY 30 g 0   fluticasone (CUTIVATE) 0.05 % cream APPLY  CREAM TOPICALLY TWICE DAILY 30 g 0   hydrocortisone 2.5 % ointment APPLY OINTMENT EXTERNALLY ONCE DAILY TO FACE AND GENITALS     omeprazole (PRILOSEC) 20 MG capsule 1 capsule 30 minutes before morning meal     ondansetron (ZOFRAN) 4 MG tablet Take 4 mg by mouth every 8 (eight) hours as needed for nausea or vomiting.     sertraline (ZOLOFT) 50 MG tablet TAKE 1 TABLET BY MOUTH AT BEDTIME 90 tablet 0   trimethoprim-polymyxin b (POLYTRIM) ophthalmic  solution Place 2 drops into the right eye every 4 (four) hours. 10 mL 0   Vitamin D, Ergocalciferol, (DRISDOL) 1.25 MG (50000 UNIT) CAPS capsule Take 1 capsule (50,000 Units total) by mouth every 7 (seven) days. 13 capsule 1   No facility-administered medications prior to visit.    PAST MEDICAL HISTORY: Past Medical History:  Diagnosis Date   Allergy    Central sleep apnea    AHI of 44 exacerbated to 52 and failed CPAP   Colon cancer (HCC)    polpy removed from colon that was cancerous   Complex sleep apnea syndrome    baseline AHI of 44 on 04-21-11, changed to adapt SV     Complication of anesthesia    Patient has hard time waking up   GERD (gastroesophageal reflux disease)    HEMORRHOIDS-INTERNAL 10/01/2007   Qualifier: Diagnosis of  By: Russella Dar MD Marylu Lund    High cholesterol    PONV (postoperative nausea and vomiting)    Psoriasis    Right elbow pain 08/19/2015    PAST SURGICAL HISTORY: Past Surgical History:  Procedure Laterality Date   COLONOSCOPY  2009   CANCEROUS POLYPS REMOVED   KNEE ARTHROSCOPY Left 1979   KNEE ARTHROSCOPY Right 08/22/2012   Procedure: RIGHT KNEE ARTHROSCOPY WITH DEBRIDEMENT ;  Surgeon: Senaida Lange, MD;  Location: [facility] OR;  Service: Orthopedics;  Laterality: Right;   WRIST FRACTURE SURGERY Left ~ 1995    FAMILY HISTORY: Family History  Problem Relation Age of Onset   Sleep disorder Father    Hyperlipidemia Father    Sleep disorder Paternal Grandfather    Heart disease Paternal Grandfather    Cancer Paternal Grandmother        breast   Breast cancer Mother    Alcohol abuse Mother    Eating disorder Mother    Diabetes Maternal Grandmother    Heart disease Maternal Grandfather        late life   Hyperlipidemia Maternal Grandfather    Stroke Maternal Grandfather        late life    SOCIAL HISTORY: Social History   Socioeconomic History   Marital status: Married    Spouse name: Not on file   Number of children: 2   Years of  education: BS   Highest education level: Bachelor's degree (e.g., BA, AB, BS)  Occupational History    Comment: [employer]  Tobacco Use   Smoking status: Never   Smokeless tobacco: Never  Vaping Use   Vaping status: Never Used  Substance and Sexual Activity   Alcohol use: Yes    Alcohol/week: 3.0 - 4.0 standard drinks of alcohol    Types: 3 - 4 Cans of beer per week    Comment: 3 -  4 beers during some weeks    Drug use: No   Sexual activity: Yes  Other Topics Concern   Not on file  Social History Narrative   Married. 2 grown daughters-  one in [place] ([employer] grad) and one in [place] (23) in 2022.       BA at [employer]- econ major. 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)      HObbies: sports activites- golf, tennis, frisbee golf    Social Drivers of Health   Financial Resource Strain: Low Risk  (06/27/2022)   Overall Financial Resource Strain (CARDIA)    Difficulty of Paying Living Expenses: Not hard at all  Food Insecurity: No Food Insecurity (06/27/2022)   Hunger Vital Sign    Worried About Running Out of Food in the Last Year: Never true    Ran Out of Food in the Last Year: Never true  Transportation Needs: No Transportation Needs (06/27/2022)   PRAPARE - Administrator, Civil Service (Medical): No    Lack of Transportation (Non-Medical): No  Physical Activity: Not on file  Stress: Not on file  Social Connections: Moderately Integrated (06/27/2022)   Social Connection and Isolation Panel [NHANES]    Frequency of Communication with Friends and Family: More than three times a week    Frequency of Social Gatherings with Friends and Family: More than three times a week    Attends Religious Services: 1 to 4 times per year    Active Member of [employer] or Organizations: No    Attends Engineer, structural: Not on file    Marital Status: Married  Catering manager Violence: Not on file       PHYSICAL EXAM  Vitals:   03/13/23 0909  BP: 125/77  Pulse: 70  Weight: 167 lb (75.8 kg)  Height: 5\' 5"  (1.651 m)   Body mass index is 27.79 kg/m.  Generalized: Well developed, in no acute distress  Chest: Lungs clear to auscultation bilaterally  Neurological examination  Mentation: Alert oriented to time, place, history taking. Follows all commands speech and language fluent Cranial nerve II-XII: Facial symmetry noted   DIAGNOSTIC DATA (LABS, IMAGING, TESTING) - I reviewed patient records, labs, notes, testing and imaging myself where available.  Lab Results  Component Value Date   WBC 4.2 02/06/2022   HGB 14.7 02/06/2022   HCT 43.2 02/06/2022   MCV 86.2 02/06/2022   PLT 248.0 02/06/2022      Component Value Date/Time   NA 141 02/06/2022 0911   K 4.3 02/06/2022 0911   CL 106 02/06/2022 0911   CO2 28 02/06/2022 0911   GLUCOSE 102 (H) 02/06/2022 0911   BUN 18 02/06/2022 0911   CREATININE 0.79 02/06/2022 0911   CREATININE 0.82 10/21/2019 1026   CALCIUM 9.6 02/06/2022 0911   PROT 6.8 02/06/2022 0911   ALBUMIN 4.6 02/06/2022 0911   AST 19 02/06/2022 0911   ALT 18 02/06/2022 0911   ALKPHOS 145 (H) 02/06/2022 0911   BILITOT 0.4 02/06/2022 0911   GFRNONAA 98 10/21/2019 1026   GFRAA 114 10/21/2019 1026   Lab Results  Component Value Date   CHOL 202 (H) 12/08/2021   HDL 57.30 12/08/2021   LDLCALC 123 (H) 12/08/2021   TRIG 107.0 12/08/2021   CHOLHDL 4 12/08/2021   No results found for: "HGBA1C" Lab Results  Component Value Date   VITAMINB12 524 12/07/2020   Lab Results  Component Value Date   TSH 1.21  02/06/2022      ASSESSMENT AND PLAN 63 y.o. year old male  has a past medical history of Allergy, Central sleep apnea, Colon cancer (HCC), Complex sleep apnea syndrome, Complication of anesthesia, GERD (gastroesophageal reflux disease), HEMORRHOIDS-INTERNAL (10/01/2007), High cholesterol, PONV (postoperative nausea and vomiting), Psoriasis, and Right  elbow pain (08/19/2015). here with:  OSA on CPAP  -I reviewed his sleep study with him. -We discussed inspire.  After reviewing the processes not sure he is interested.  He is willing to retry CPAP therapy.  We will change his mask to a DreamWear full face.  He was given a sample mask today.  He is advised to try this for the next 1 to 2 months.  If he still cannot tolerate CPAP still let us know. -Follow-up in 6 months or sooner if needed    Butch Penny, MSN, NP-C 03/11/2023, 1:40 PM [facility] [address] [place], [place] [address] [phone]

## 2023-03-12 ENCOUNTER — Other Ambulatory Visit: Payer: Self-pay | Admitting: Family Medicine

## 2023-03-13 ENCOUNTER — Encounter: Payer: Self-pay | Admitting: Adult Health

## 2023-03-13 ENCOUNTER — Ambulatory Visit: Payer: BC Managed Care – PPO | Admitting: Adult Health

## 2023-03-13 VITALS — BP 125/77 | HR 70 | Ht 65.0 in | Wt 167.0 lb

## 2023-03-13 DIAGNOSIS — G4733 Obstructive sleep apnea (adult) (pediatric): Secondary | ICD-10-CM | POA: Diagnosis not present

## 2023-03-13 NOTE — Progress Notes (Signed)
 Setup date: 06/14/11

## 2023-03-13 NOTE — Patient Instructions (Signed)
 restart using CPAP nightly and greater than 4 hours each night Try dreamwear full face If your symptoms worsen or you develop new symptoms please let us know.

## 2023-03-22 DIAGNOSIS — S335XXA Sprain of ligaments of lumbar spine, initial encounter: Secondary | ICD-10-CM | POA: Diagnosis not present

## 2023-03-22 DIAGNOSIS — M9901 Segmental and somatic dysfunction of cervical region: Secondary | ICD-10-CM | POA: Diagnosis not present

## 2023-03-22 DIAGNOSIS — M5411 Radiculopathy, occipito-atlanto-axial region: Secondary | ICD-10-CM | POA: Diagnosis not present

## 2023-03-31 ENCOUNTER — Other Ambulatory Visit: Payer: Self-pay | Admitting: Neurology

## 2023-04-05 DIAGNOSIS — S335XXA Sprain of ligaments of lumbar spine, initial encounter: Secondary | ICD-10-CM | POA: Diagnosis not present

## 2023-04-05 DIAGNOSIS — M5411 Radiculopathy, occipito-atlanto-axial region: Secondary | ICD-10-CM | POA: Diagnosis not present

## 2023-04-05 DIAGNOSIS — M9901 Segmental and somatic dysfunction of cervical region: Secondary | ICD-10-CM | POA: Diagnosis not present

## 2023-04-12 DIAGNOSIS — M9901 Segmental and somatic dysfunction of cervical region: Secondary | ICD-10-CM | POA: Diagnosis not present

## 2023-04-12 DIAGNOSIS — M5411 Radiculopathy, occipito-atlanto-axial region: Secondary | ICD-10-CM | POA: Diagnosis not present

## 2023-04-12 DIAGNOSIS — S335XXA Sprain of ligaments of lumbar spine, initial encounter: Secondary | ICD-10-CM | POA: Diagnosis not present

## 2023-04-25 DIAGNOSIS — S335XXA Sprain of ligaments of lumbar spine, initial encounter: Secondary | ICD-10-CM | POA: Diagnosis not present

## 2023-04-25 DIAGNOSIS — M9901 Segmental and somatic dysfunction of cervical region: Secondary | ICD-10-CM | POA: Diagnosis not present

## 2023-04-25 DIAGNOSIS — M5411 Radiculopathy, occipito-atlanto-axial region: Secondary | ICD-10-CM | POA: Diagnosis not present

## 2023-05-01 DIAGNOSIS — M25511 Pain in right shoulder: Secondary | ICD-10-CM | POA: Diagnosis not present

## 2023-05-01 DIAGNOSIS — S46112D Strain of muscle, fascia and tendon of long head of biceps, left arm, subsequent encounter: Secondary | ICD-10-CM | POA: Diagnosis not present

## 2023-05-01 DIAGNOSIS — M25512 Pain in left shoulder: Secondary | ICD-10-CM | POA: Diagnosis not present

## 2023-05-01 DIAGNOSIS — S46111S Strain of muscle, fascia and tendon of long head of biceps, right arm, sequela: Secondary | ICD-10-CM | POA: Diagnosis not present

## 2023-05-04 DIAGNOSIS — M25512 Pain in left shoulder: Secondary | ICD-10-CM | POA: Diagnosis not present

## 2023-05-04 DIAGNOSIS — S46111S Strain of muscle, fascia and tendon of long head of biceps, right arm, sequela: Secondary | ICD-10-CM | POA: Diagnosis not present

## 2023-05-04 DIAGNOSIS — M25511 Pain in right shoulder: Secondary | ICD-10-CM | POA: Diagnosis not present

## 2023-05-04 DIAGNOSIS — S46112D Strain of muscle, fascia and tendon of long head of biceps, left arm, subsequent encounter: Secondary | ICD-10-CM | POA: Diagnosis not present

## 2023-05-08 DIAGNOSIS — S46111S Strain of muscle, fascia and tendon of long head of biceps, right arm, sequela: Secondary | ICD-10-CM | POA: Diagnosis not present

## 2023-05-08 DIAGNOSIS — S46112D Strain of muscle, fascia and tendon of long head of biceps, left arm, subsequent encounter: Secondary | ICD-10-CM | POA: Diagnosis not present

## 2023-05-08 DIAGNOSIS — M25511 Pain in right shoulder: Secondary | ICD-10-CM | POA: Diagnosis not present

## 2023-05-08 DIAGNOSIS — M25512 Pain in left shoulder: Secondary | ICD-10-CM | POA: Diagnosis not present

## 2023-05-09 DIAGNOSIS — M9901 Segmental and somatic dysfunction of cervical region: Secondary | ICD-10-CM | POA: Diagnosis not present

## 2023-05-09 DIAGNOSIS — M5411 Radiculopathy, occipito-atlanto-axial region: Secondary | ICD-10-CM | POA: Diagnosis not present

## 2023-05-09 DIAGNOSIS — S335XXA Sprain of ligaments of lumbar spine, initial encounter: Secondary | ICD-10-CM | POA: Diagnosis not present

## 2023-05-10 DIAGNOSIS — S46112D Strain of muscle, fascia and tendon of long head of biceps, left arm, subsequent encounter: Secondary | ICD-10-CM | POA: Diagnosis not present

## 2023-05-10 DIAGNOSIS — M25512 Pain in left shoulder: Secondary | ICD-10-CM | POA: Diagnosis not present

## 2023-05-10 DIAGNOSIS — M25511 Pain in right shoulder: Secondary | ICD-10-CM | POA: Diagnosis not present

## 2023-05-10 DIAGNOSIS — S46111S Strain of muscle, fascia and tendon of long head of biceps, right arm, sequela: Secondary | ICD-10-CM | POA: Diagnosis not present

## 2023-05-16 DIAGNOSIS — M25512 Pain in left shoulder: Secondary | ICD-10-CM | POA: Diagnosis not present

## 2023-05-16 DIAGNOSIS — S46112D Strain of muscle, fascia and tendon of long head of biceps, left arm, subsequent encounter: Secondary | ICD-10-CM | POA: Diagnosis not present

## 2023-05-16 DIAGNOSIS — S46111S Strain of muscle, fascia and tendon of long head of biceps, right arm, sequela: Secondary | ICD-10-CM | POA: Diagnosis not present

## 2023-05-16 DIAGNOSIS — M25511 Pain in right shoulder: Secondary | ICD-10-CM | POA: Diagnosis not present

## 2023-05-17 NOTE — Progress Notes (Signed)
 Dohmeier, Alejandro Byes, MD  Alejandro Currier, NP I like for him to give CPAP a good try out again. His 2 sleep studies were not showing the same. Severe OSA versus severe cyclic breathing-  Would you mind ordering a HST should he return with CPAP problems?  Alejandro [name] can reduce his apnea but often does nothing for insomnia ( the same as CPAP )  And most likely, his residual AHI will still be in the double digits. CD       Previous Messages    ----- Message ----- From: Alejandro Currier, NP Sent: 03/13/2023   1:04 PM EDT To: Neomia Banner, MD  I looked at his sleep study.  Not sure that he is a candidate for inspire.  Can you review and let me know your thoughts.  For right now he is going to retry CPAP therapy.  But he was asking about the inspire device

## 2023-05-18 DIAGNOSIS — S46112D Strain of muscle, fascia and tendon of long head of biceps, left arm, subsequent encounter: Secondary | ICD-10-CM | POA: Diagnosis not present

## 2023-05-18 DIAGNOSIS — S46111S Strain of muscle, fascia and tendon of long head of biceps, right arm, sequela: Secondary | ICD-10-CM | POA: Diagnosis not present

## 2023-05-18 DIAGNOSIS — M25512 Pain in left shoulder: Secondary | ICD-10-CM | POA: Diagnosis not present

## 2023-05-18 DIAGNOSIS — M25511 Pain in right shoulder: Secondary | ICD-10-CM | POA: Diagnosis not present

## 2023-05-22 DIAGNOSIS — S46111S Strain of muscle, fascia and tendon of long head of biceps, right arm, sequela: Secondary | ICD-10-CM | POA: Diagnosis not present

## 2023-05-22 DIAGNOSIS — S46112D Strain of muscle, fascia and tendon of long head of biceps, left arm, subsequent encounter: Secondary | ICD-10-CM | POA: Diagnosis not present

## 2023-05-22 DIAGNOSIS — M25512 Pain in left shoulder: Secondary | ICD-10-CM | POA: Diagnosis not present

## 2023-05-22 DIAGNOSIS — M25511 Pain in right shoulder: Secondary | ICD-10-CM | POA: Diagnosis not present

## 2023-05-24 DIAGNOSIS — M9901 Segmental and somatic dysfunction of cervical region: Secondary | ICD-10-CM | POA: Diagnosis not present

## 2023-05-24 DIAGNOSIS — S335XXA Sprain of ligaments of lumbar spine, initial encounter: Secondary | ICD-10-CM | POA: Diagnosis not present

## 2023-05-24 DIAGNOSIS — M5411 Radiculopathy, occipito-atlanto-axial region: Secondary | ICD-10-CM | POA: Diagnosis not present

## 2023-05-25 DIAGNOSIS — S46111S Strain of muscle, fascia and tendon of long head of biceps, right arm, sequela: Secondary | ICD-10-CM | POA: Diagnosis not present

## 2023-05-25 DIAGNOSIS — S46112D Strain of muscle, fascia and tendon of long head of biceps, left arm, subsequent encounter: Secondary | ICD-10-CM | POA: Diagnosis not present

## 2023-05-25 DIAGNOSIS — M25512 Pain in left shoulder: Secondary | ICD-10-CM | POA: Diagnosis not present

## 2023-05-25 DIAGNOSIS — M25511 Pain in right shoulder: Secondary | ICD-10-CM | POA: Diagnosis not present

## 2023-05-30 DIAGNOSIS — M25512 Pain in left shoulder: Secondary | ICD-10-CM | POA: Diagnosis not present

## 2023-05-30 DIAGNOSIS — M25511 Pain in right shoulder: Secondary | ICD-10-CM | POA: Diagnosis not present

## 2023-05-30 DIAGNOSIS — S46112D Strain of muscle, fascia and tendon of long head of biceps, left arm, subsequent encounter: Secondary | ICD-10-CM | POA: Diagnosis not present

## 2023-05-30 DIAGNOSIS — S46111S Strain of muscle, fascia and tendon of long head of biceps, right arm, sequela: Secondary | ICD-10-CM | POA: Diagnosis not present

## 2023-06-01 DIAGNOSIS — S46111S Strain of muscle, fascia and tendon of long head of biceps, right arm, sequela: Secondary | ICD-10-CM | POA: Diagnosis not present

## 2023-06-01 DIAGNOSIS — M25511 Pain in right shoulder: Secondary | ICD-10-CM | POA: Diagnosis not present

## 2023-06-01 DIAGNOSIS — S46112D Strain of muscle, fascia and tendon of long head of biceps, left arm, subsequent encounter: Secondary | ICD-10-CM | POA: Diagnosis not present

## 2023-06-01 DIAGNOSIS — M25512 Pain in left shoulder: Secondary | ICD-10-CM | POA: Diagnosis not present

## 2023-06-04 ENCOUNTER — Encounter: Payer: Self-pay | Admitting: Family Medicine

## 2023-06-04 ENCOUNTER — Other Ambulatory Visit: Payer: Self-pay

## 2023-06-04 ENCOUNTER — Telehealth: Payer: Self-pay | Admitting: Family Medicine

## 2023-06-04 ENCOUNTER — Ambulatory Visit (INDEPENDENT_AMBULATORY_CARE_PROVIDER_SITE_OTHER)

## 2023-06-04 ENCOUNTER — Ambulatory Visit: Admitting: Family Medicine

## 2023-06-04 VITALS — BP 118/74 | HR 62 | Ht 65.0 in | Wt 159.0 lb

## 2023-06-04 DIAGNOSIS — M25511 Pain in right shoulder: Secondary | ICD-10-CM | POA: Diagnosis not present

## 2023-06-04 DIAGNOSIS — G8929 Other chronic pain: Secondary | ICD-10-CM

## 2023-06-04 DIAGNOSIS — M25512 Pain in left shoulder: Secondary | ICD-10-CM | POA: Diagnosis not present

## 2023-06-04 DIAGNOSIS — Z888 Allergy status to other drugs, medicaments and biological substances status: Secondary | ICD-10-CM | POA: Diagnosis not present

## 2023-06-04 NOTE — Telephone Encounter (Signed)
 Patient called asking for MRI to be sent to:  [facility] [address], [address] [place], [place] [address] Phone: [phone] Fax: [phone]

## 2023-06-04 NOTE — Patient Instructions (Addendum)
 Thank you for coming in today.   Please get an Xray today before you leave   [facility] [address]   Let me know where you want me to send the MRI.   I've referred you to [facility] in [facility] .  Let us  know if you don't hear from them in one week.   Let me know when you get scheduled with Allergy

## 2023-06-04 NOTE — Progress Notes (Signed)
 ICarol Chroman, PhD, LAT, ATC acting as a scribe for Garlan Juniper, MD.  Alejandro [name] is a 63 y.o. male who presents to [facility] today for bilat shoulder pain, L>R ongoing since the end of Feb. He was skiing and was trying to use both poles to get up from the ground. He noticed the pain a few days later, w/ progressive weakness. Pt locates pain to superior and anterior aspect of the shoulder, w/ an aching pain through.   Left shoulder is worse than right but both hurt.  He plays a lot of tennis and golf. He is RHD. He allergic to corticosteroids. He had severe retinal inflammation following an intramuscular steroid injection about 15 years ago.  He can tolerate topical steroids.  Radiates: slightly into upper arm Aggravates: IR, overhead, aBd Treatments tried: PT @ [facility],   Pertinent review of systems: No fevers or chills  Relevant historical information: Sleep apnea.   Exam:  BP 118/74   Pulse 62   Ht 5\' 5"  (1.651 m)   Wt 159 lb (72.1 kg)   SpO2 97%   BMI 26.46 kg/m  General: Well Developed, well nourished, and in no acute distress.   MSK: Left shoulder normal-appearing Normal motion pain with abduction. Intact strength. Positive Hawkins and Neer's test.  Negative Yergason's and speeds test.  Right shoulder normal-appearing normal motion pain with abduction.  Intact strength.    Lab and Radiology Results  X-ray images bilateral shoulders obtained today personally and independently interpreted.  Left shoulder: No acute fractures.  Minimal degenerative changes..  Right shoulder: No acute fractures.  Minimal degenerative changes.  Await formal radiology review    Assessment and Plan: 63 y.o. male with chronic bilateral shoulder pain left worse than right.  Patient has had good trials of home exercise program and physical therapy over the past 6 weeks.  This pain has been ongoing since an injury occurring while skiing  since 3 months ago.  Plan for left shoulder MRI to further characterize source of pain.  He has a significant allergy to steroids so we should not use systemic steroids.  Anticipate either direct referral to orthopedic surgery or recheck after MRI.  I am also referring to allergy/immunology.  It may be possible to test to see if he can tolerate dexamethasone which should not be very related to the likely intermuscular steroid he had 15 years ago.  He probably got Depo-Medrol or triamcinolone .  Dexamethasone does not have the same preservatives and Depo agents and should be better tolerated.  Will contact his allergist once he gets an appointment scheduled to see if this is even feasible.   PDMP not reviewed this encounter. Orders Placed This Encounter  Procedures   DG Shoulder Left    Standing Status:   Future    Number of Occurrences:   1    Expiration Date:   06/03/2024    Reason for Exam (SYMPTOM  OR DIAGNOSIS REQUIRED):   eval shulder pain    Preferred imaging location?:   [place] [facility]   DG Shoulder Right    Standing Status:   Future    Number of Occurrences:   1    Expiration Date:   06/03/2024    Reason for Exam (SYMPTOM  OR DIAGNOSIS REQUIRED):   right shoulder pain    Preferred imaging location?:   [facility]   Ambulatory referral to Allergy    Referral Priority:   Routine  Referral Type:   Allergy Testing    Referral Reason:   Specialty Services Required    Requested Specialty:   Allergy    Number of Visits Requested:   1   No orders of the defined types were placed in this encounter.    Discussed warning signs or symptoms. Please see discharge instructions. Patient expresses understanding.   The above documentation has been reviewed and is accurate and complete Garlan Juniper, M.D.

## 2023-06-05 ENCOUNTER — Ambulatory Visit: Admitting: Family Medicine

## 2023-06-05 ENCOUNTER — Other Ambulatory Visit: Payer: Self-pay

## 2023-06-05 NOTE — Addendum Note (Signed)
 Addended by: Mena Stai on: 06/05/2023 09:59 AM   Modules accepted: Orders

## 2023-06-07 DIAGNOSIS — S335XXA Sprain of ligaments of lumbar spine, initial encounter: Secondary | ICD-10-CM | POA: Diagnosis not present

## 2023-06-07 DIAGNOSIS — M5411 Radiculopathy, occipito-atlanto-axial region: Secondary | ICD-10-CM | POA: Diagnosis not present

## 2023-06-07 DIAGNOSIS — M9901 Segmental and somatic dysfunction of cervical region: Secondary | ICD-10-CM | POA: Diagnosis not present

## 2023-06-11 ENCOUNTER — Ambulatory Visit: Payer: Self-pay | Admitting: Family Medicine

## 2023-06-11 NOTE — Progress Notes (Signed)
 Right shoulder x-ray looks normal to radiology

## 2023-06-11 NOTE — Progress Notes (Signed)
 Left shoulder x-ray shows just a little bit of spur where the rotator cuff inserts.

## 2023-06-13 ENCOUNTER — Other Ambulatory Visit: Payer: Self-pay | Admitting: Family Medicine

## 2023-06-14 DIAGNOSIS — M25512 Pain in left shoulder: Secondary | ICD-10-CM | POA: Diagnosis not present

## 2023-06-14 DIAGNOSIS — S40012A Contusion of left shoulder, initial encounter: Secondary | ICD-10-CM | POA: Diagnosis not present

## 2023-06-18 ENCOUNTER — Telehealth: Payer: Self-pay | Admitting: Family Medicine

## 2023-06-18 DIAGNOSIS — G8929 Other chronic pain: Secondary | ICD-10-CM

## 2023-06-18 DIAGNOSIS — Z888 Allergy status to other drugs, medicaments and biological substances status: Secondary | ICD-10-CM

## 2023-06-18 NOTE — Telephone Encounter (Signed)
 MRI report received. This has been added to the media tab and a copy is in Dr [employer] box.  Patient is very anxious to get these results.  Can you call patient to review once Dr Alease Hunter has seen them?

## 2023-06-18 NOTE — Telephone Encounter (Signed)
 MRI shows a bone bruise at the humeral head without a fracture.  You do have tendinitis of all 3 major rotator cuff tendons without tear.  You do have bursitis as well.  This all could cause the pain that you are experiencing.  This should improve with physical therapy.  Next step if not better would be a steroid injection or surgery.  Based on your likely allergy to steroids it may be very scary to do an injection.  Would you like to schedule a time to talk about the results in full detail.  Typically I am available at 7 AM and at noon.

## 2023-06-20 ENCOUNTER — Encounter (HOSPITAL_BASED_OUTPATIENT_CLINIC_OR_DEPARTMENT_OTHER): Payer: Self-pay

## 2023-06-20 DIAGNOSIS — M9901 Segmental and somatic dysfunction of cervical region: Secondary | ICD-10-CM | POA: Diagnosis not present

## 2023-06-20 DIAGNOSIS — S335XXA Sprain of ligaments of lumbar spine, initial encounter: Secondary | ICD-10-CM | POA: Diagnosis not present

## 2023-06-20 DIAGNOSIS — M5411 Radiculopathy, occipito-atlanto-axial region: Secondary | ICD-10-CM | POA: Diagnosis not present

## 2023-06-20 NOTE — Telephone Encounter (Signed)
 Discussed the MRI results. Plan for referral to Ortho surgery and back to [facility]

## 2023-06-20 NOTE — Addendum Note (Signed)
 Addended by: Syliva Even on: 06/20/2023 07:32 AM   Modules accepted: Orders

## 2023-06-21 DIAGNOSIS — M25512 Pain in left shoulder: Secondary | ICD-10-CM | POA: Diagnosis not present

## 2023-06-21 DIAGNOSIS — S46012A Strain of muscle(s) and tendon(s) of the rotator cuff of left shoulder, initial encounter: Secondary | ICD-10-CM | POA: Diagnosis not present

## 2023-06-22 ENCOUNTER — Encounter: Payer: Self-pay | Admitting: Family Medicine

## 2023-06-25 DIAGNOSIS — S46012A Strain of muscle(s) and tendon(s) of the rotator cuff of left shoulder, initial encounter: Secondary | ICD-10-CM | POA: Diagnosis not present

## 2023-06-25 DIAGNOSIS — M25512 Pain in left shoulder: Secondary | ICD-10-CM | POA: Diagnosis not present

## 2023-06-28 ENCOUNTER — Ambulatory Visit (HOSPITAL_BASED_OUTPATIENT_CLINIC_OR_DEPARTMENT_OTHER): Admitting: Orthopaedic Surgery

## 2023-06-29 DIAGNOSIS — S46012A Strain of muscle(s) and tendon(s) of the rotator cuff of left shoulder, initial encounter: Secondary | ICD-10-CM | POA: Diagnosis not present

## 2023-06-29 DIAGNOSIS — M25512 Pain in left shoulder: Secondary | ICD-10-CM | POA: Diagnosis not present

## 2023-07-02 ENCOUNTER — Other Ambulatory Visit: Payer: Self-pay | Admitting: Neurology

## 2023-07-02 DIAGNOSIS — M25512 Pain in left shoulder: Secondary | ICD-10-CM | POA: Diagnosis not present

## 2023-07-02 DIAGNOSIS — S46012A Strain of muscle(s) and tendon(s) of the rotator cuff of left shoulder, initial encounter: Secondary | ICD-10-CM | POA: Diagnosis not present

## 2023-07-04 ENCOUNTER — Encounter: Payer: Self-pay | Admitting: Family Medicine

## 2023-07-04 DIAGNOSIS — M9901 Segmental and somatic dysfunction of cervical region: Secondary | ICD-10-CM | POA: Diagnosis not present

## 2023-07-04 DIAGNOSIS — M5411 Radiculopathy, occipito-atlanto-axial region: Secondary | ICD-10-CM | POA: Diagnosis not present

## 2023-07-04 DIAGNOSIS — S335XXA Sprain of ligaments of lumbar spine, initial encounter: Secondary | ICD-10-CM | POA: Diagnosis not present

## 2023-07-04 NOTE — Progress Notes (Unsigned)
 New Patient Note  RE: Alejandro [name] MRN: [id number] DOB: Apr 12, 1960 Date of Office Visit: 07/05/2023  Consult requested by: Joane Artist RAMAN, MD Primary care provider: Katrinka Garnette KIDD, MD  Chief Complaint: No chief complaint on file.  History of Present Illness: I had the pleasure of seeing Alejandro [name] for initial evaluation at the [facility] on 07/05/2023. Alejandro [name] is a 63 y.o. male, who is referred here by Katrinka Garnette KIDD, MD for the evaluation of steroid allergy.  Discussed the use of AI scribe software for clinical note transcription with the patient, who gave verbal consent to proceed.  History of Present Illness             Drug reaction: Reaction to *** which occurred *** ago.  Patient was being treated for ***. Symptoms started after taking ***. Symptoms include: *** Symptoms persisted for ***.  Treatment included: ***. Mucosal involvement: {Blank single:19197::yes ***,no}.  Denies any *** fevers, chills, changes in medications, foods, personal care products or recent infections. Alejandro [name] has tried the following therapies: *** with *** benefit. Systemic steroids ***.  Previous work up includes: ***. Previous history of rash/hives: ***. Previous history of drug reactions: ***.  06/04/2023 sports medicine visit: Alejandro [name] plays a lot of tennis and golf. Alejandro [name] is RHD. Alejandro [name] allergic to corticosteroids. Alejandro [name] had severe retinal inflammation following an intramuscular steroid injection about 15 years ago.  Alejandro [name] can tolerate topical steroids.  Assessment and Plan: Alejandro [name] is a 63 y.o. male with: ***  Assessment and Plan               No follow-ups on file.  No orders of the defined types were placed in this encounter.  Lab Orders  No laboratory test(s) ordered today    Other allergy screening: Asthma: {Blank single:19197::yes,no} Rhino conjunctivitis: {Blank single:19197::yes,no} Food allergy: {Blank single:19197::yes,no} Medication  allergy: {Blank single:19197::yes,no} Hymenoptera allergy: {Blank single:19197::yes,no} Urticaria: {Blank single:19197::yes,no} Eczema:{Blank single:19197::yes,no} History of recurrent infections suggestive of immunodeficency: {Blank single:19197::yes,no}  Diagnostics: Spirometry:  Tracings reviewed. His effort: {Blank single:19197::Good reproducible efforts.,It was hard to get consistent efforts and there is a question as to whether this reflects a maximal maneuver.,Poor effort, data can not be interpreted.} FVC: ***L FEV1: ***L, ***% predicted FEV1/FVC ratio: ***% Interpretation: {Blank single:19197::Spirometry consistent with mild obstructive disease,Spirometry consistent with moderate obstructive disease,Spirometry consistent with severe obstructive disease,Spirometry consistent with possible restrictive disease,Spirometry consistent with mixed obstructive and restrictive disease,Spirometry uninterpretable due to technique,Spirometry consistent with normal pattern,No overt abnormalities noted given today's efforts}.  Please see scanned spirometry results for details.  Skin Testing: {Blank single:19197::Select foods,Environmental allergy panel,Environmental allergy panel and select foods,Food allergy panel,None,Deferred due to recent antihistamines use}. *** Results discussed with patient/family.   Past Medical History: Patient Active Problem List   Diagnosis Date Noted  . Vitamin D  deficiency 02/06/2022  . Benign essential tremor 08/03/2020  . Severe obstructive sleep apnea-hypopnea syndrome 04/15/2020  . Vestibular disequilibrium 04/15/2020  . Cyclical vomiting syndrome not associated with migraine 04/15/2020  . Insomnia disorder, with other sleep disorder, recurrent 04/15/2020  . Complex sleep apnea syndrome 12/19/2019  . Intolerance of continuous positive airway pressure (CPAP) ventilation 12/19/2019  . DDD (degenerative disc  disease), lumbar 10/21/2019  . Prolonged P-R interval 07/25/2017  . Syncope 06/14/2017  . Psoriasis 02/22/2016  . Groin rash 02/22/2016  . Severe motion sickness 02/22/2016  . Hyperhidrosis of palms and soles 02/22/2016  . Left hamstring muscle strain 12/08/2014  . Hyperlipidemia 09/05/2013  . GERD (  gastroesophageal reflux disease) 09/05/2013  . Sleep apnea 09/28/2011  . History of colon cancer 10/01/2007   Past Medical History:  Diagnosis Date  . Allergy   . Central sleep apnea    AHI of 44 exacerbated to 52 and failed CPAP  . Colon cancer (HCC)    polpy removed from colon that was cancerous  . Complex sleep apnea syndrome    baseline AHI of 44 on 04-21-11, changed to adapt SV    . Complication of anesthesia    Patient has hard time waking up  . GERD (gastroesophageal reflux disease)   . HEMORRHOIDS-INTERNAL 10/01/2007   Qualifier: Diagnosis of  By: Aneita MD NOLIA Gwendlyn ONEIDA SABRA High cholesterol   . PONV (postoperative nausea and vomiting)   . Psoriasis   . Right elbow pain 08/19/2015   Past Surgical History: Past Surgical History:  Procedure Laterality Date  . COLONOSCOPY  2009   CANCEROUS POLYPS REMOVED  . KNEE ARTHROSCOPY Left 1979  . KNEE ARTHROSCOPY Right 08/22/2012   Procedure: RIGHT KNEE ARTHROSCOPY WITH DEBRIDEMENT ;  Surgeon: Franky CHRISTELLA Pointer, MD;  Location: [facility] OR;  Service: Orthopedics;  Laterality: Right;  . WRIST FRACTURE SURGERY Left ~ 1995   Medication List:  Current Outpatient Medications  Medication Sig Dispense Refill  . ALPRAZolam  (XANAX ) 0.25 MG tablet Take 1 tablet (0.25 mg total) by mouth daily as needed for anxiety (only to be used as needed for vestibular/ cyclic vomiting.). Prn for vestibular/ cyclic vomiting. 30 tablet 1  . atorvastatin  (LIPITOR) 40 MG tablet Take 1 tablet by mouth once daily 90 tablet 0  . betamethasone  dipropionate 0.05 % lotion APPLY LOTION TOPICALLY ONCE DAILY TO SCALP AND EARS    . Calcipotriene  0.005 % solution APPLY  SOLUTION  TOPICALLY TWICE DAILY 60 mL 0  . clotrimazole -betamethasone  (LOTRISONE ) cream APPLY  CREAM TOPICALLY TWICE DAILY 30 g 0  . fluticasone  (CUTIVATE ) 0.05 % cream APPLY  CREAM TOPICALLY TWICE DAILY 30 g 0  . hydrocortisone 2.5 % ointment APPLY OINTMENT EXTERNALLY ONCE DAILY TO FACE AND GENITALS    . omeprazole  (PRILOSEC) 20 MG capsule 1 capsule 30 minutes before morning meal    . ondansetron  (ZOFRAN ) 4 MG tablet Take 4 mg by mouth every 8 (eight) hours as needed for nausea or vomiting.    . sertraline  (ZOLOFT ) 50 MG tablet TAKE 1 TABLET BY MOUTH AT BEDTIME 90 tablet 0  . trimethoprim -polymyxin b  (POLYTRIM ) ophthalmic solution Place 2 drops into the right eye every 4 (four) hours. 10 mL 0  . Vitamin D , Ergocalciferol , (DRISDOL ) 1.25 MG (50000 UNIT) CAPS capsule Take 1 capsule (50,000 Units total) by mouth every 7 (seven) days. 13 capsule 1   No current facility-administered medications for this visit.   Allergies: Allergies  Allergen Reactions  . Corticosteroids     Other reaction(s): Other (See Comments) Blurry vision- blisters behind retinas Blurry vision- blisters behind retinas  . Cortisone   . Depo-Medrol [Methylprednisolone Sodium Succ] Other (See Comments)    Blurry vision- blisters behind retinas  . Other Other (See Comments)    Does/t likt narcotics and cannot take steroids  Blurry vision- blisters behind retinas   . Codeine Other (See Comments)    REACTION: nausea, vomiting ABD PAIN REACTION: nausea, vomiting Other reaction(s): Other (See Comments), stomach cramps ABD PAIN REACTION: nausea, vomiting REACTION: nausea, vomiting ABD PAIN REACTION: nausea, vomiting   . Prednisone     Other reaction(s): blisters on retina  . Trazodone  And Nefazodone  Motion sickness issues if didn't fall asleep by time kicked in    Social History: Social History   Socioeconomic History  . Marital status: Married    Spouse name: Not on file  . Number of children: 2  . Years of  education: BS  . Highest education level: Bachelor's degree (e.g., BA, AB, BS)  Occupational History    Comment: [employer]  Tobacco Use  . Smoking status: Never  . Smokeless tobacco: Never  Vaping Use  . Vaping status: Never Used  Substance and Sexual Activity  . Alcohol use: Yes    Alcohol/week: 3.0 - 4.0 standard drinks of alcohol    Types: 3 - 4 Cans of beer per week    Comment: 3 - 4 beers during some weeks   . Drug use: No  . Sexual activity: Yes  Other Topics Concern  . Not on file  Social History Narrative   Married. 2 grown daughters-  one in [place]  ([employer] grad) and one in [place] (23) in 2022.       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 Difficulty of Paying Living Expenses: Not hard at all  Food Insecurity: No Food Insecurity (06/27/2022)   Hunger Vital Sign   . Worried About Programme researcher, broadcasting/film/video in the Last Year: Never true   . Ran Out of Food in the Last Year: Never true  Transportation Needs: No Transportation Needs (06/27/2022)   PRAPARE - Transportation   . Lack of Transportation (Medical): No   . Lack of Transportation (Non-Medical): No  Physical Activity: Not on file  Stress: Not on file  Social Connections: Moderately Integrated (06/27/2022)   Social Connection and Isolation Panel   . Frequency of Communication with Friends and Family: More than three times a week   . Frequency of Social Gatherings with Friends and Family: More than three times a week   . Attends Religious Services: 1 to 4 times per year   . Active Member of Clubs or Organizations: No   . Attends Banker Meetings: Not on file   . Marital Status: Married   Lives in a ***. Smoking:  *** Occupation: ***  Environmental HistorySurveyor, minerals in the house: Network engineer in the family room: {Blank single:19197::yes,no} Carpet in the bedroom: {Blank single:19197::yes,no} Heating: {Blank single:19197::electric,gas,heat pump} Cooling: {Blank single:19197::central,window,heat pump} Pet: {Blank single:19197::yes ***,no}  Family History: Family History  Problem Relation Age of Onset  . Sleep disorder Father   . Hyperlipidemia Father   . Sleep disorder Paternal Grandfather   . Heart disease Paternal Grandfather   . Cancer Paternal Grandmother        breast  . Breast cancer Mother   . Alcohol abuse Mother   . Eating disorder Mother   . Diabetes Maternal Grandmother   . Heart disease Maternal Grandfather        late life  . Hyperlipidemia Maternal Grandfather   . Stroke Maternal Grandfather        late life   Problem  Relation Asthma                                   *** Eczema                                *** Food allergy                          *** Allergic rhino conjunctivitis     ***  Review of Systems  Constitutional:  Negative for appetite change, chills, fever and unexpected weight change.  HENT:  Negative for congestion and rhinorrhea.   Eyes:  Negative for itching.  Respiratory:  Negative for cough, chest tightness, shortness of breath and wheezing.   Cardiovascular:  Negative for chest pain.  Gastrointestinal:  Negative for abdominal pain.  Genitourinary:  Negative for difficulty urinating.  Skin:  Negative for rash.  Neurological:  Negative for headaches.    Objective: There were no vitals taken for this visit. There is no height or weight on file to calculate BMI. Physical Exam Vitals and nursing note reviewed.  Constitutional:      Appearance: Normal appearance. Alejandro [name] is well-developed.  HENT:     Head: Normocephalic and atraumatic.     Right Ear:  Tympanic membrane and external ear normal.     Left Ear: Tympanic membrane and external ear normal.     Nose: Nose normal.     Mouth/Throat:     Mouth: Mucous membranes are moist.     Pharynx: Oropharynx is clear.  Eyes:     Conjunctiva/sclera: Conjunctivae normal.  Cardiovascular:     Rate and Rhythm: Normal rate and regular rhythm.     Heart sounds: Normal heart sounds. No murmur heard.    No friction rub. No gallop.  Pulmonary:     Effort: Pulmonary effort is normal.     Breath sounds: Normal breath sounds. No wheezing, rhonchi or rales.  Musculoskeletal:     Cervical back: Neck supple.  Skin:    General: Skin is warm.     Findings: No rash.  Neurological:     Mental Status: Alejandro [name] is alert and oriented to person, place, and time.  Psychiatric:        Behavior: Behavior normal.   The plan was reviewed with the patient/family, and all questions/concerned were addressed.  It was my pleasure to see Alejandro [name] today and participate in his care. Please feel free to contact me with any questions or concerns.  Sincerely,  Orlan Cramp, DO Allergy & Immunology  [facility]  [facility] office: [phone] [facility] office: [phone]

## 2023-07-05 ENCOUNTER — Ambulatory Visit: Admitting: Allergy

## 2023-07-05 ENCOUNTER — Encounter: Payer: Self-pay | Admitting: Allergy

## 2023-07-05 VITALS — BP 100/64 | HR 68 | Temp 97.9°F | Ht 64.76 in | Wt 156.2 lb

## 2023-07-05 DIAGNOSIS — Z888 Allergy status to other drugs, medicaments and biological substances status: Secondary | ICD-10-CM | POA: Diagnosis not present

## 2023-07-05 DIAGNOSIS — T50995D Adverse effect of other drugs, medicaments and biological substances, subsequent encounter: Secondary | ICD-10-CM

## 2023-07-05 NOTE — Patient Instructions (Addendum)
-  ADVERSE REACTION TO CORTICOSTEROIDS: You had an adverse reaction to a cortisone injection 15 years ago, which caused vision changes. Because of this, it is recommended that you avoid corticosteroid injections in the future to prevent any risk to your vision. We have communicated this information to Dr. Joane.  Follow up as needed.

## 2023-07-10 DIAGNOSIS — M25512 Pain in left shoulder: Secondary | ICD-10-CM | POA: Diagnosis not present

## 2023-07-10 DIAGNOSIS — S46012A Strain of muscle(s) and tendon(s) of the rotator cuff of left shoulder, initial encounter: Secondary | ICD-10-CM | POA: Diagnosis not present

## 2023-07-13 DIAGNOSIS — M25512 Pain in left shoulder: Secondary | ICD-10-CM | POA: Diagnosis not present

## 2023-07-13 DIAGNOSIS — S46012A Strain of muscle(s) and tendon(s) of the rotator cuff of left shoulder, initial encounter: Secondary | ICD-10-CM | POA: Diagnosis not present

## 2023-07-18 ENCOUNTER — Encounter (HOSPITAL_BASED_OUTPATIENT_CLINIC_OR_DEPARTMENT_OTHER): Payer: Self-pay

## 2023-07-18 ENCOUNTER — Ambulatory Visit (HOSPITAL_BASED_OUTPATIENT_CLINIC_OR_DEPARTMENT_OTHER): Admitting: Orthopaedic Surgery

## 2023-07-19 DIAGNOSIS — M25512 Pain in left shoulder: Secondary | ICD-10-CM | POA: Diagnosis not present

## 2023-07-19 DIAGNOSIS — S46012A Strain of muscle(s) and tendon(s) of the rotator cuff of left shoulder, initial encounter: Secondary | ICD-10-CM | POA: Diagnosis not present

## 2023-07-23 DIAGNOSIS — S46012A Strain of muscle(s) and tendon(s) of the rotator cuff of left shoulder, initial encounter: Secondary | ICD-10-CM | POA: Diagnosis not present

## 2023-07-23 DIAGNOSIS — M25512 Pain in left shoulder: Secondary | ICD-10-CM | POA: Diagnosis not present

## 2023-07-25 DIAGNOSIS — S335XXA Sprain of ligaments of lumbar spine, initial encounter: Secondary | ICD-10-CM | POA: Diagnosis not present

## 2023-07-25 DIAGNOSIS — M5411 Radiculopathy, occipito-atlanto-axial region: Secondary | ICD-10-CM | POA: Diagnosis not present

## 2023-07-25 DIAGNOSIS — M9901 Segmental and somatic dysfunction of cervical region: Secondary | ICD-10-CM | POA: Diagnosis not present

## 2023-07-27 DIAGNOSIS — M25512 Pain in left shoulder: Secondary | ICD-10-CM | POA: Diagnosis not present

## 2023-07-27 DIAGNOSIS — S46012A Strain of muscle(s) and tendon(s) of the rotator cuff of left shoulder, initial encounter: Secondary | ICD-10-CM | POA: Diagnosis not present

## 2023-07-31 DIAGNOSIS — M25512 Pain in left shoulder: Secondary | ICD-10-CM | POA: Diagnosis not present

## 2023-07-31 DIAGNOSIS — S46012A Strain of muscle(s) and tendon(s) of the rotator cuff of left shoulder, initial encounter: Secondary | ICD-10-CM | POA: Diagnosis not present

## 2023-08-14 DIAGNOSIS — M25512 Pain in left shoulder: Secondary | ICD-10-CM | POA: Diagnosis not present

## 2023-08-14 DIAGNOSIS — S46012A Strain of muscle(s) and tendon(s) of the rotator cuff of left shoulder, initial encounter: Secondary | ICD-10-CM | POA: Diagnosis not present

## 2023-08-15 DIAGNOSIS — I69998 Other sequelae following unspecified cerebrovascular disease: Secondary | ICD-10-CM | POA: Diagnosis not present

## 2023-08-15 DIAGNOSIS — H2513 Age-related nuclear cataract, bilateral: Secondary | ICD-10-CM | POA: Diagnosis not present

## 2023-08-15 DIAGNOSIS — S335XXA Sprain of ligaments of lumbar spine, initial encounter: Secondary | ICD-10-CM | POA: Diagnosis not present

## 2023-08-15 DIAGNOSIS — M5411 Radiculopathy, occipito-atlanto-axial region: Secondary | ICD-10-CM | POA: Diagnosis not present

## 2023-08-15 DIAGNOSIS — H11153 Pinguecula, bilateral: Secondary | ICD-10-CM | POA: Diagnosis not present

## 2023-08-15 DIAGNOSIS — H1045 Other chronic allergic conjunctivitis: Secondary | ICD-10-CM | POA: Diagnosis not present

## 2023-08-15 DIAGNOSIS — M9901 Segmental and somatic dysfunction of cervical region: Secondary | ICD-10-CM | POA: Diagnosis not present

## 2023-08-17 DIAGNOSIS — M25512 Pain in left shoulder: Secondary | ICD-10-CM | POA: Diagnosis not present

## 2023-08-17 DIAGNOSIS — S46012A Strain of muscle(s) and tendon(s) of the rotator cuff of left shoulder, initial encounter: Secondary | ICD-10-CM | POA: Diagnosis not present

## 2023-08-24 DIAGNOSIS — S46012A Strain of muscle(s) and tendon(s) of the rotator cuff of left shoulder, initial encounter: Secondary | ICD-10-CM | POA: Diagnosis not present

## 2023-08-24 DIAGNOSIS — M25512 Pain in left shoulder: Secondary | ICD-10-CM | POA: Diagnosis not present

## 2023-08-28 DIAGNOSIS — M25512 Pain in left shoulder: Secondary | ICD-10-CM | POA: Diagnosis not present

## 2023-08-28 DIAGNOSIS — S46012A Strain of muscle(s) and tendon(s) of the rotator cuff of left shoulder, initial encounter: Secondary | ICD-10-CM | POA: Diagnosis not present

## 2023-08-31 DIAGNOSIS — S46012A Strain of muscle(s) and tendon(s) of the rotator cuff of left shoulder, initial encounter: Secondary | ICD-10-CM | POA: Diagnosis not present

## 2023-08-31 DIAGNOSIS — M25512 Pain in left shoulder: Secondary | ICD-10-CM | POA: Diagnosis not present

## 2023-09-05 DIAGNOSIS — M5411 Radiculopathy, occipito-atlanto-axial region: Secondary | ICD-10-CM | POA: Diagnosis not present

## 2023-09-05 DIAGNOSIS — M9901 Segmental and somatic dysfunction of cervical region: Secondary | ICD-10-CM | POA: Diagnosis not present

## 2023-09-05 DIAGNOSIS — S46012A Strain of muscle(s) and tendon(s) of the rotator cuff of left shoulder, initial encounter: Secondary | ICD-10-CM | POA: Diagnosis not present

## 2023-09-05 DIAGNOSIS — M25512 Pain in left shoulder: Secondary | ICD-10-CM | POA: Diagnosis not present

## 2023-09-05 DIAGNOSIS — S335XXA Sprain of ligaments of lumbar spine, initial encounter: Secondary | ICD-10-CM | POA: Diagnosis not present

## 2023-09-07 DIAGNOSIS — S46012A Strain of muscle(s) and tendon(s) of the rotator cuff of left shoulder, initial encounter: Secondary | ICD-10-CM | POA: Diagnosis not present

## 2023-09-07 DIAGNOSIS — M25512 Pain in left shoulder: Secondary | ICD-10-CM | POA: Diagnosis not present

## 2023-09-14 DIAGNOSIS — M25512 Pain in left shoulder: Secondary | ICD-10-CM | POA: Diagnosis not present

## 2023-09-14 DIAGNOSIS — S46012A Strain of muscle(s) and tendon(s) of the rotator cuff of left shoulder, initial encounter: Secondary | ICD-10-CM | POA: Diagnosis not present

## 2023-09-18 DIAGNOSIS — M25512 Pain in left shoulder: Secondary | ICD-10-CM | POA: Diagnosis not present

## 2023-09-18 DIAGNOSIS — S46012A Strain of muscle(s) and tendon(s) of the rotator cuff of left shoulder, initial encounter: Secondary | ICD-10-CM | POA: Diagnosis not present

## 2023-09-21 DIAGNOSIS — S46012A Strain of muscle(s) and tendon(s) of the rotator cuff of left shoulder, initial encounter: Secondary | ICD-10-CM | POA: Diagnosis not present

## 2023-09-21 DIAGNOSIS — M25512 Pain in left shoulder: Secondary | ICD-10-CM | POA: Diagnosis not present

## 2023-10-01 DIAGNOSIS — M25512 Pain in left shoulder: Secondary | ICD-10-CM | POA: Diagnosis not present

## 2023-10-01 DIAGNOSIS — S46012A Strain of muscle(s) and tendon(s) of the rotator cuff of left shoulder, initial encounter: Secondary | ICD-10-CM | POA: Diagnosis not present

## 2023-10-03 DIAGNOSIS — M9901 Segmental and somatic dysfunction of cervical region: Secondary | ICD-10-CM | POA: Diagnosis not present

## 2023-10-03 DIAGNOSIS — S335XXA Sprain of ligaments of lumbar spine, initial encounter: Secondary | ICD-10-CM | POA: Diagnosis not present

## 2023-10-03 DIAGNOSIS — M5411 Radiculopathy, occipito-atlanto-axial region: Secondary | ICD-10-CM | POA: Diagnosis not present

## 2023-10-16 DIAGNOSIS — S46012A Strain of muscle(s) and tendon(s) of the rotator cuff of left shoulder, initial encounter: Secondary | ICD-10-CM | POA: Diagnosis not present

## 2023-10-16 DIAGNOSIS — M25512 Pain in left shoulder: Secondary | ICD-10-CM | POA: Diagnosis not present

## 2023-10-17 ENCOUNTER — Encounter: Payer: Self-pay | Admitting: Adult Health

## 2023-10-17 ENCOUNTER — Ambulatory Visit: Admitting: Adult Health

## 2023-10-17 VITALS — BP 121/69 | HR 63 | Ht 65.0 in | Wt 151.8 lb

## 2023-10-17 DIAGNOSIS — G4733 Obstructive sleep apnea (adult) (pediatric): Secondary | ICD-10-CM

## 2023-10-17 NOTE — Progress Notes (Signed)
 PATIENT: Braulio Kiedrowski DOB: [birth year]  REASON FOR VISIT: follow up HISTORY FROM: patient PRIMARY NEUROLOGIST: Dr. Chalice  Chief Complaint  Patient presents with   Follow-up    Pt in 4 alone Pt here for cpap f/u Pt states hasn't used cpap since August .not able to tolerate mask       HISTORY OF PRESENT ILLNESS: Today 10/17/23:  Aengus Sauceda is a 63 y.o. male with a history of OSA on CPAP. Returns today for follow-up. Reports that he has not been using the CPAP machine.  He states after last visit he did get a new mask and it for the patient was the best mask he has had but he still cannot tolerate using the CPAP.  He states that he is sleeping poorly.  Reports he gets up to 3 times to go the bathroom.  Reports that he falls asleep easily but tends to feel restless through the night.  Does not feel overly sleepy during the day but does not feel well rested when he wakes in the morning.  He returns today for an evaluation   03/13/23: Gram Wang Granada is a 63 y.o. male with a history of OSA on CPAP. Returns today for follow-up.  He has not been using his CPAP November 2024.  He states that he has been struggling to get used to it.  States that he sleeps better without it.  Reports that a lot of times he struggles to fall asleep.  He has tried different mask in the past.  He is asking about inspire device.  HISTORY 08/09/22:   Jeziah Kretschmer is a 63 y.o. male with a history of OSA on CPAP and Insomnia. Returns today for follow-up.  His download indicates that he uses his machine 7 out of 30 days for compliance of 23%.  He uses his machine greater than 4 hours only 5 days for compliance of 60%.  He has residual AHI is 4.6.  He states that he has struggled using the CPAP.  States that he moves around a lot at night and the mask starts to leak.  Sometimes he takes the mask off unknowingly during the night.  He has not changed out his mask.  Currently using the  DreamWear mask.  He states that he does not have a problem falling asleep typically but when he wakes up during the night he is unable to go back to sleep.  This certainly could be due to untreated sleep apnea?  He does report some nasal congestion which makes it hard to use the mask.  Reports that he has tried Flonase  in the past.  He returns today for an evaluation.  REVIEW OF SYSTEMS: Out of a complete 14 system review of symptoms, the patient complains only of the following symptoms, and all other reviewed systems are negative.  FSS 15  ESS 7  ALLERGIES: Allergies  Allergen Reactions   Corticosteroids     Other reaction(s): Other (See Comments) Blurry vision- blisters behind retinas Blurry vision- blisters behind retinas   Cortisone    Depo-Medrol [Methylprednisolone Sodium Succ] Other (See Comments)    Blurry vision- blisters behind retinas   Other Other (See Comments)    Does/t likt narcotics and cannot take steroids  Blurry vision- blisters behind retinas    Codeine Other (See Comments)    REACTION: nausea, vomiting ABD PAIN REACTION: nausea, vomiting Other reaction(s): Other (See Comments), stomach cramps ABD PAIN REACTION: nausea, vomiting REACTION: nausea,  vomiting ABD PAIN REACTION: nausea, vomiting    Prednisone     Other reaction(s): blisters on retina   Trazodone  And Nefazodone     Motion sickness issues if didn't fall asleep by time kicked in     HOME MEDICATIONS: Outpatient Medications Prior to Visit  Medication Sig Dispense Refill   ALPRAZolam  (XANAX ) 0.25 MG tablet Take 1 tablet (0.25 mg total) by mouth daily as needed for anxiety (only to be used as needed for vestibular/ cyclic vomiting.). Prn for vestibular/ cyclic vomiting. 30 tablet 1   atorvastatin  (LIPITOR) 40 MG tablet Take 1 tablet by mouth once daily 90 tablet 0   betamethasone  dipropionate 0.05 % lotion APPLY LOTION TOPICALLY ONCE DAILY TO SCALP AND EARS     Calcipotriene  0.005 % solution APPLY   SOLUTION TOPICALLY TWICE DAILY 60 mL 0   fluticasone  (CUTIVATE ) 0.05 % cream APPLY  CREAM TOPICALLY TWICE DAILY 30 g 0   hydrocortisone 2.5 % ointment APPLY OINTMENT EXTERNALLY ONCE DAILY TO FACE AND GENITALS     IBUPROFEN PO Take by mouth.     omeprazole  (PRILOSEC) 20 MG capsule 1 capsule 30 minutes before morning meal     ondansetron  (ZOFRAN ) 4 MG tablet Take 4 mg by mouth every 8 (eight) hours as needed for nausea or vomiting.     sertraline  (ZOLOFT ) 50 MG tablet TAKE 1 TABLET BY MOUTH AT BEDTIME 90 tablet 0   Vitamin D , Ergocalciferol , (DRISDOL ) 1.25 MG (50000 UNIT) CAPS capsule Take 1 capsule (50,000 Units total) by mouth every 7 (seven) days. 13 capsule 1   clotrimazole -betamethasone  (LOTRISONE ) cream APPLY  CREAM TOPICALLY TWICE DAILY 30 g 0   No facility-administered medications prior to visit.    PAST MEDICAL HISTORY: Past Medical History:  Diagnosis Date   Allergy    Central sleep apnea    AHI of 44 exacerbated to 52 and failed CPAP   Colon cancer (HCC)    polpy removed from colon that was cancerous   Complex sleep apnea syndrome    baseline AHI of 44 on 04-21-11, changed to adapt SV     Complication of anesthesia    Patient has hard time waking up   Eczema    GERD (gastroesophageal reflux disease)    HEMORRHOIDS-INTERNAL 10/01/2007   Qualifier: Diagnosis of  By: Aneita MD NOLIA Gwendlyn DASEN    High cholesterol    PONV (postoperative nausea and vomiting)    Psoriasis    Right elbow pain 08/19/2015    PAST SURGICAL HISTORY: Past Surgical History:  Procedure Laterality Date   COLONOSCOPY  2009   CANCEROUS POLYPS REMOVED   KNEE ARTHROSCOPY Left 1979   KNEE ARTHROSCOPY Right 08/22/2012   Procedure: RIGHT KNEE ARTHROSCOPY WITH DEBRIDEMENT ;  Surgeon: Franky CHRISTELLA Pointer, MD;  Location: [facility] OR;  Service: Orthopedics;  Laterality: Right;   WRIST FRACTURE SURGERY Left ~ 1995    FAMILY HISTORY: Family History  Problem Relation Age of Onset   Breast cancer Mother    Alcohol  abuse Mother    Eating disorder Mother    Sleep disorder Father    Hyperlipidemia Father    Allergic rhinitis Brother    Diabetes Maternal Grandmother    Heart disease Maternal Grandfather        late life   Hyperlipidemia Maternal Grandfather    Stroke Maternal Grandfather        late life   Cancer Paternal Grandmother        breast   Sleep  disorder Paternal Grandfather    Heart disease Paternal Grandfather    Eczema Other     SOCIAL HISTORY: Social History   Socioeconomic History   Marital status: Married    Spouse name: Not on file   Number of children: 2   Years of education: BS   Highest education level: Bachelor's degree (e.g., BA, AB, BS)  Occupational History    Comment: [employer]  Tobacco Use   Smoking status: Never   Smokeless tobacco: Never  Vaping Use   Vaping status: Never Used  Substance and Sexual Activity   Alcohol use: Yes    Comment: occ   Drug use: No   Sexual activity: Yes  Other Topics Concern   Not on file  Social History Narrative   Married. 2 grown daughters-  one in [place]  ([employer] grad) and one in [place] (23) in 2022.       BA at [employer]- econ major. 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)      HObbies: sports activites- golf, tennis, frisbee golf    Social Drivers of Health   Financial Resource Strain: Low Risk  (06/27/2022)   Overall Financial Resource Strain (CARDIA)    Difficulty of Paying Living Expenses: Not hard at all  Food Insecurity: No Food Insecurity (06/27/2022)   Hunger Vital Sign    Worried About Running Out of Food in the Last Year: Never true    Ran Out of Food in the Last Year: Never true  Transportation Needs: No Transportation Needs (06/27/2022)   PRAPARE - Administrator, Civil Service (Medical): No    Lack of Transportation (Non-Medical): No  Physical Activity: Not on file  Stress: Not on file  Social Connections:  Moderately Integrated (06/27/2022)   Social Connection and Isolation Panel    Frequency of Communication with Friends and Family: More than three times a week    Frequency of Social Gatherings with Friends and Family: More than three times a week    Attends Religious Services: 1 to 4 times per year    Active Member of [employer] or Organizations: No    Attends Banker Meetings: Not on file    Marital Status: Married  Catering manager Violence: Not on file      PHYSICAL EXAM  Vitals:   10/17/23 1112  BP: 121/69  Pulse: 63  Weight: 151 lb 12.8 oz (68.9 kg)  Height: 5' 5 (1.651 m)    Body mass index is 25.26 kg/m.  Generalized: Well developed, in no acute distress  Chest: Lungs clear to auscultation bilaterally  Neurological examination  Mentation: Alert oriented to time, place, history taking. Follows all commands speech and language fluent Cranial nerve II-XII: Facial symmetry noted   DIAGNOSTIC DATA (LABS, IMAGING, TESTING) - I reviewed patient records, labs, notes, testing and imaging myself where available.  Lab Results  Component Value Date   WBC 4.2 02/06/2022   HGB 14.7 02/06/2022   HCT 43.2 02/06/2022   MCV 86.2 02/06/2022   PLT 248.0 02/06/2022      Component Value Date/Time   NA 141 02/06/2022 0911   K 4.3 02/06/2022 0911   CL 106 02/06/2022 0911   CO2 28 02/06/2022 0911   GLUCOSE 102 (H) 02/06/2022 0911   BUN 18 02/06/2022 0911   CREATININE 0.79 02/06/2022 0911   CREATININE 0.82 10/21/2019 1026   CALCIUM  9.6 02/06/2022 0911   PROT 6.8 02/06/2022 0911  ALBUMIN 4.6 02/06/2022 0911   AST 19 02/06/2022 0911   ALT 18 02/06/2022 0911   ALKPHOS 145 (H) 02/06/2022 0911   BILITOT 0.4 02/06/2022 0911   GFRNONAA 98 10/21/2019 1026   GFRAA 114 10/21/2019 1026   Lab Results  Component Value Date   CHOL 202 (H) 12/08/2021   HDL 57.30 12/08/2021   LDLCALC 123 (H) 12/08/2021   TRIG 107.0 12/08/2021   CHOLHDL 4 12/08/2021   No results found  for: HGBA1C Lab Results  Component Value Date   VITAMINB12 524 12/07/2020   Lab Results  Component Value Date   TSH 1.21 02/06/2022      ASSESSMENT AND PLAN 63 y.o. year old male  has a past medical history of Allergy, Central sleep apnea, Colon cancer (HCC), Complex sleep apnea syndrome, Complication of anesthesia, Eczema, GERD (gastroesophageal reflux disease), HEMORRHOIDS-INTERNAL (10/01/2007), High cholesterol, PONV (postoperative nausea and vomiting), Psoriasis, and Right elbow pain (08/19/2015). here with:  OSA on CPAP  -I reviewed his sleep study with him. - We will repeat in-lab study-prefer in-lab study as previous study showed severe complex sleep apnea with hypoxemia. - Patient may be interested in inspire device as an alternative treatment if he qualifies. -Patient will follow-up pending his sleep test  Orders Placed This Encounter  Procedures   Nocturnal polysomnography    Severe and complex sleep apnea per previous study. Reeval for OSA    Standing Status:   Future    Expiration Date:   10/16/2024    Where should this test be performed::   [facility]     Duwaine Russell, MSN, NP-C 10/17/2023, 2:42 PM [facility] [address] [place], [place] [address] [phone]  The patient's condition requires frequent monitoring and adjustments in the treatment plan, reflecting the ongoing complexity of care.  This provider is the continuing focal point for all needed services for this condition.

## 2023-10-17 NOTE — Patient Instructions (Signed)
Repeat home sleep test  

## 2023-10-22 DIAGNOSIS — S46012A Strain of muscle(s) and tendon(s) of the rotator cuff of left shoulder, initial encounter: Secondary | ICD-10-CM | POA: Diagnosis not present

## 2023-10-22 DIAGNOSIS — M25512 Pain in left shoulder: Secondary | ICD-10-CM | POA: Diagnosis not present

## 2023-10-23 ENCOUNTER — Other Ambulatory Visit: Payer: Self-pay | Admitting: Family Medicine

## 2023-10-23 ENCOUNTER — Other Ambulatory Visit: Payer: Self-pay | Admitting: Neurology

## 2023-11-02 DIAGNOSIS — M25512 Pain in left shoulder: Secondary | ICD-10-CM | POA: Diagnosis not present

## 2023-11-02 DIAGNOSIS — S46012A Strain of muscle(s) and tendon(s) of the rotator cuff of left shoulder, initial encounter: Secondary | ICD-10-CM | POA: Diagnosis not present

## 2023-11-05 ENCOUNTER — Encounter: Payer: Self-pay | Admitting: Radiology

## 2023-11-06 ENCOUNTER — Telehealth: Payer: Self-pay | Admitting: Adult Health

## 2023-11-06 DIAGNOSIS — G4739 Other sleep apnea: Secondary | ICD-10-CM

## 2023-11-06 NOTE — Telephone Encounter (Signed)
 [employer]BS denied the NPSG. Do you want to order a HST?

## 2023-11-06 NOTE — Addendum Note (Signed)
 Addended by: SHERRYL DUWAINE SQUIBB on: 11/06/2023 09:20 AM   Modules accepted: Orders

## 2023-11-06 NOTE — Telephone Encounter (Signed)
 Noted, BCBS HST pending

## 2023-11-08 DIAGNOSIS — M25512 Pain in left shoulder: Secondary | ICD-10-CM | POA: Diagnosis not present

## 2023-11-08 DIAGNOSIS — S46012A Strain of muscle(s) and tendon(s) of the rotator cuff of left shoulder, initial encounter: Secondary | ICD-10-CM | POA: Diagnosis not present

## 2023-11-13 DIAGNOSIS — M25512 Pain in left shoulder: Secondary | ICD-10-CM | POA: Diagnosis not present

## 2023-11-13 DIAGNOSIS — S46012A Strain of muscle(s) and tendon(s) of the rotator cuff of left shoulder, initial encounter: Secondary | ICD-10-CM | POA: Diagnosis not present

## 2023-11-14 DIAGNOSIS — S335XXA Sprain of ligaments of lumbar spine, initial encounter: Secondary | ICD-10-CM | POA: Diagnosis not present

## 2023-11-14 DIAGNOSIS — M9901 Segmental and somatic dysfunction of cervical region: Secondary | ICD-10-CM | POA: Diagnosis not present

## 2023-11-14 DIAGNOSIS — M5411 Radiculopathy, occipito-atlanto-axial region: Secondary | ICD-10-CM | POA: Diagnosis not present

## 2023-11-16 DIAGNOSIS — S46012A Strain of muscle(s) and tendon(s) of the rotator cuff of left shoulder, initial encounter: Secondary | ICD-10-CM | POA: Diagnosis not present

## 2023-11-16 DIAGNOSIS — M25512 Pain in left shoulder: Secondary | ICD-10-CM | POA: Diagnosis not present

## 2023-11-20 DIAGNOSIS — M25512 Pain in left shoulder: Secondary | ICD-10-CM | POA: Diagnosis not present

## 2023-11-20 DIAGNOSIS — S46012A Strain of muscle(s) and tendon(s) of the rotator cuff of left shoulder, initial encounter: Secondary | ICD-10-CM | POA: Diagnosis not present

## 2023-11-23 DIAGNOSIS — S46012A Strain of muscle(s) and tendon(s) of the rotator cuff of left shoulder, initial encounter: Secondary | ICD-10-CM | POA: Diagnosis not present

## 2023-11-23 DIAGNOSIS — M25512 Pain in left shoulder: Secondary | ICD-10-CM | POA: Diagnosis not present

## 2023-11-26 DIAGNOSIS — M25512 Pain in left shoulder: Secondary | ICD-10-CM | POA: Diagnosis not present

## 2023-11-26 DIAGNOSIS — S46012A Strain of muscle(s) and tendon(s) of the rotator cuff of left shoulder, initial encounter: Secondary | ICD-10-CM | POA: Diagnosis not present

## 2023-11-26 NOTE — Telephone Encounter (Signed)
 Checked status on the portal it is still pending

## 2023-11-27 NOTE — Telephone Encounter (Signed)
 HST BCBS shara: [id number] (exp. 11/06/23 to 01/04/24)

## 2023-11-30 ENCOUNTER — Ambulatory Visit (INDEPENDENT_AMBULATORY_CARE_PROVIDER_SITE_OTHER): Admitting: Neurology

## 2023-11-30 DIAGNOSIS — G4739 Other sleep apnea: Secondary | ICD-10-CM

## 2023-11-30 DIAGNOSIS — G4733 Obstructive sleep apnea (adult) (pediatric): Secondary | ICD-10-CM

## 2023-11-30 DIAGNOSIS — G4730 Sleep apnea, unspecified: Secondary | ICD-10-CM

## 2023-11-30 DIAGNOSIS — I440 Atrioventricular block, first degree: Secondary | ICD-10-CM

## 2023-11-30 DIAGNOSIS — H832X3 Labyrinthine dysfunction, bilateral: Secondary | ICD-10-CM

## 2023-12-04 DIAGNOSIS — M25512 Pain in left shoulder: Secondary | ICD-10-CM | POA: Diagnosis not present

## 2023-12-04 DIAGNOSIS — M5411 Radiculopathy, occipito-atlanto-axial region: Secondary | ICD-10-CM | POA: Diagnosis not present

## 2023-12-04 DIAGNOSIS — S46012A Strain of muscle(s) and tendon(s) of the rotator cuff of left shoulder, initial encounter: Secondary | ICD-10-CM | POA: Diagnosis not present

## 2023-12-04 DIAGNOSIS — S335XXA Sprain of ligaments of lumbar spine, initial encounter: Secondary | ICD-10-CM | POA: Diagnosis not present

## 2023-12-04 DIAGNOSIS — M9901 Segmental and somatic dysfunction of cervical region: Secondary | ICD-10-CM | POA: Diagnosis not present

## 2023-12-06 DIAGNOSIS — M25512 Pain in left shoulder: Secondary | ICD-10-CM | POA: Diagnosis not present

## 2023-12-06 DIAGNOSIS — S46012A Strain of muscle(s) and tendon(s) of the rotator cuff of left shoulder, initial encounter: Secondary | ICD-10-CM | POA: Diagnosis not present

## 2023-12-10 DIAGNOSIS — M25512 Pain in left shoulder: Secondary | ICD-10-CM | POA: Diagnosis not present

## 2023-12-10 DIAGNOSIS — S46012A Strain of muscle(s) and tendon(s) of the rotator cuff of left shoulder, initial encounter: Secondary | ICD-10-CM | POA: Diagnosis not present

## 2023-12-14 DIAGNOSIS — S46012A Strain of muscle(s) and tendon(s) of the rotator cuff of left shoulder, initial encounter: Secondary | ICD-10-CM | POA: Diagnosis not present

## 2023-12-14 DIAGNOSIS — M25512 Pain in left shoulder: Secondary | ICD-10-CM | POA: Diagnosis not present

## 2023-12-20 NOTE — Progress Notes (Signed)
 [name]     [facility]   HOME SLEEP TEST REPORT ( by Elene  mail -out device )      STUDY DATE:   11-30-2023 Data received :  12-20-2023  ORDERING CLINICIAN:  Duwaine Russell, NP  REFERRING CLINICIAN:  Garnette Lukes , MD    CLINICAL INFORMATION/HISTORY: Pt has a LUNA machine , needs baseline test for new device.  Hx ofd vestibular dysfunction, complex and severe sleep apnea,  insomnia.  Cyclic vomiting.  Alejandro [name] is a 64 y.o. male with a history of OSA on CPAP. Returns today for follow-up. Reports that he has not been using the CPAP machine.  He states after last visit he did get a new mask and it for the patient was the best mask he has had but he still cannot tolerate using the CPAP.  He states that he is sleeping poorly.  Reports he gets up to 3 times to go the bathroom.  Reports that he falls asleep easily but tends to feel restless through the night.  Does not feel overly sleepy during the day but does not feel well rested when he wakes in the morning.  He returns today for an evaluation  CPAP f/u. Takes mask off  in sleep. Has nasal pillow CPAP mask. Tried nasal cradle.  I fall asleep OK but I can't stay asleep with it     Epworth sleepiness score:  7 points now, down from 13 / 24 in 2023 and  from pre CPAP- 15/ 24 points   FSS endorsed at  15 / 63 down from 32/ 63 points.   BMI: 25.26 ( lost 15 pounds ) kg/m  Sleep Summary:   Total Recording Time (hours, min):   7 h 43 m      Total Sleep Time (hours, min):  3 h and 28 minutes    Sleep efficiency %;     45%                                   Respiratory Indices by AASM  criteria of scoring;    Calculated pAHI (per hour):   31.1 /h                                               Positional  respiratory activity  / snoring :  frequent and brief desaturation in oxygen.   Oxygen Saturation  in Sleep    Oxygen Saturation (%) Mean:    93.7%                O2 Saturation Range (%):     57.3% through 100%                                    O2 Saturation (minutes) <89%:  11 minutes          Pulse Rate in Sleep :   Pulse Mean (bpm):    63 bpm ,  Pulse Range:  between  53 and 82 bpm.             IMPRESSION:  This HST confirms the ongoing presence of severe sleep apnea, and the highest apnea count is seen in supine  sleep.  the patient presents with moderate hypoxia.  REM sleep AHI cannot be calculated by [name] test at this time.  PS : Should this patient consider an Inspire device, please order in -lab study with sleep aid.    RECOMMENDATION:  I like for this patient to avoid supine sleep and sleep with the head of bed elevated. I have preferred the use of PASP therapy. Some patient's cannot tolerate CPAP , will accept that a partial treatment with alternative therapies may be the best we can achieve.  Insomnia treatment with cognitive behavioral therapy is another way to improve night time rest.  PS : Xanax  prn 0.25 mg has been maintained as an anxiolytic for this patient with chronic insomnia.    Any Patient endorsing a high level of sleepiness should be cautioned not to drive, work at heights, or operate dangerous machinery or heavy equipment when tired or sleepy.  Review of good sleep hygiene measures took place in the initial consultation but should be revisited ( Your guide to better sleep  a publication by the NIH is a good source of information).   The referring provider will be notified of the test results.    I certify that I have reviewed the raw data recording prior to the issuance of this report in accordance with the standards of the American Academy of Sleep Medicine (AASM).    INTERPRETING PHYSICIAN:   Dedra Gores, MD  [facility] and [facility] Sleep Board certified by The [employer] of Sleep Medicine and Diplomate of the [employer] of Sleep Medicine. Board certified In Neurology through the ABPN, Fellow of the [employer] of Neurology.

## 2024-01-01 DIAGNOSIS — G4730 Sleep apnea, unspecified: Secondary | ICD-10-CM | POA: Insufficient documentation

## 2024-01-01 NOTE — Procedures (Signed)
 Data received :  12-20-2023  ORDERING CLINICIAN:  Duwaine Russell, NP  REFERRING CLINICIAN:  Garnette Lukes , MD    CLINICAL INFORMATION/HISTORY: Pt has a LUNA machine , needs baseline test for new device.  Hx ofd vestibular dysfunction, complex and severe sleep apnea,  insomnia.  Cyclic vomiting.  Alejandro [name] is a 63 y.o. male with a history of OSA on CPAP. Returns today for follow-up. Reports that he has not been using the CPAP machine.  He states after last visit he did get a new mask and it for the patient was the best mask he has had but he still cannot tolerate using the CPAP.  He states that he is sleeping poorly.  Reports he gets up to 3 times to go the bathroom.  Reports that he falls asleep easily but tends to feel restless through the night.  Does not feel overly sleepy during the day but does not feel well rested when he wakes in the morning.  He returns today for an evaluation  CPAP f/u. Takes mask off  in sleep. Has nasal pillow CPAP mask. Tried nasal cradle.  I fall asleep OK but I can't stay asleep with it     Epworth sleepiness score:  7 points now, down from 13 / 24 in 2023 and  from pre CPAP- 15/ 24 points   FSS endorsed at  15 / 63 down from 32/ 63 points.   BMI: 25.26 ( lost 15 pounds ) kg/m  Sleep Summary:   Total Recording Time (hours, min):   7 h 43 m      Total Sleep Time (hours, min):  3 h and 28 minutes    Sleep efficiency %;     45%                                   Respiratory Indices by AASM  criteria of scoring;    Calculated pAHI (per hour):   31.1 /h                                               Positional  respiratory activity  / snoring :  frequent and brief desaturation in oxygen.   Oxygen Saturation  in Sleep    Oxygen Saturation (%) Mean:    93.7%                O2 Saturation Range (%):     57.3% through 100%                                   O2 Saturation (minutes) <89%:  11 minutes          Pulse Rate in Sleep :   Pulse Mean  (bpm):    63 bpm ,  Pulse Range:  between  53 and 82 bpm.             IMPRESSION:  This HST confirms the ongoing presence of severe sleep apnea, and the highest apnea count is seen in supine sleep.  the patient presents with moderate hypoxia.  REM sleep AHI cannot be calculated by [name] test at this time.  PS : Should this patient consider an Inspire device, please order  in -lab study with sleep aid.    RECOMMENDATION:  I like for this patient to avoid supine sleep and sleep with the head of bed elevated. I have preferred the use of PASP therapy. Some patient's cannot tolerate CPAP , will accept that a partial treatment with alternative therapies may be the best we can achieve.  Insomnia treatment with cognitive behavioral therapy is another way to improve night time rest.  PS : Xanax  prn 0.25 mg has been maintained as an anxiolytic for this patient with chronic insomnia.    Any Patient endorsing a high level of sleepiness should be cautioned not to drive, work at heights, or operate dangerous machinery or heavy equipment when tired or sleepy.  Review of good sleep hygiene measures took place in the initial consultation but should be revisited ( Your guide to better sleep  a publication by the NIH is a good source of information).   The referring provider will be notified of the test results.    I certify that I have reviewed the raw data recording prior to the issuance of this report in accordance with the standards of the American Academy of Sleep Medicine (AASM).    INTERPRETING PHYSICIAN:   Dedra Gores, MD  [facility] and [facility] Sleep Board certified by The [employer] of Sleep Medicine and Diplomate of the [employer] of Sleep Medicine. Board certified In Neurology through the ABPN, Fellow of the [employer] of Neu[employer].

## 2024-01-07 ENCOUNTER — Ambulatory Visit: Payer: Self-pay | Admitting: Adult Health

## 2024-01-09 NOTE — Telephone Encounter (Signed)
 LVM for patient to call back and discuss phone note from [employer]

## 2024-01-09 NOTE — Telephone Encounter (Signed)
 LVM for patient to call back Per Megan,NP HST  had to be done first due to insurance requirement. Will offer a Referral to ENT for inspire consult . Make patient aware Per Dr Chalice and Duwaine PIETY  since he as severe sleep apnea pap treatment is the treatment preferred . Since Patient has tried and failed cpap machine will offer ENT referral .

## 2024-01-09 NOTE — Telephone Encounter (Signed)
 Pt has returned call to CMA, please call pt back.

## 2024-01-16 NOTE — Telephone Encounter (Signed)
 Spoke to patient declined ENT referral informed patient  Per  Dr. Chalice and Megan,NP pap treatment is the best treatment to treat his severe sleep apnea . Informed patient untreated sleep apnea can place him in higher risk of heart attack,stroke,and dementia. Pt states he knows everything about sleep apnea and doesn't want to move forward with ENT referral or continue using cpap machine Pt hung up

## 2024-01-16 NOTE — Telephone Encounter (Signed)
-----   Message from Olam CHRISTELLA Lather sent at 01/09/2024 12:37 PM EST -----

## 2024-01-31 ENCOUNTER — Other Ambulatory Visit: Payer: Self-pay | Admitting: Family Medicine
